# Patient Record
Sex: Male | Born: 1950 | Race: White | Hispanic: No | Marital: Single | Smoking: Current every day smoker
Health system: Southern US, Community
[De-identification: ages and names within clinical notes are randomized; demographics above are authoritative.]

## PROBLEM LIST (undated history)

## (undated) DIAGNOSIS — K219 Gastro-esophageal reflux disease without esophagitis: Secondary | ICD-10-CM

## (undated) DIAGNOSIS — J449 Chronic obstructive pulmonary disease, unspecified: Secondary | ICD-10-CM

## (undated) DIAGNOSIS — F250 Schizoaffective disorder, bipolar type: Secondary | ICD-10-CM

## (undated) DIAGNOSIS — F319 Bipolar disorder, unspecified: Secondary | ICD-10-CM

## (undated) DIAGNOSIS — E441 Mild protein-calorie malnutrition: Secondary | ICD-10-CM

## (undated) DIAGNOSIS — N400 Enlarged prostate without lower urinary tract symptoms: Secondary | ICD-10-CM

## (undated) DIAGNOSIS — R918 Other nonspecific abnormal finding of lung field: Secondary | ICD-10-CM

## (undated) DIAGNOSIS — Z720 Tobacco use: Secondary | ICD-10-CM

## (undated) DIAGNOSIS — F259 Schizoaffective disorder, unspecified: Secondary | ICD-10-CM

## (undated) HISTORY — DX: Chronic obstructive pulmonary disease, unspecified: J44.9

## (undated) HISTORY — DX: Gastro-esophageal reflux disease without esophagitis: K21.9

## (undated) HISTORY — DX: Tobacco use: Z72.0

## (undated) HISTORY — DX: Mild protein-calorie malnutrition: E44.1

## (undated) HISTORY — DX: Other nonspecific abnormal finding of lung field: R91.8

## (undated) HISTORY — PX: APPENDECTOMY: SHX54

## (undated) HISTORY — DX: Benign prostatic hyperplasia without lower urinary tract symptoms: N40.0

## (undated) HISTORY — PX: HERNIA REPAIR: SHX51

---

## 2005-01-22 ENCOUNTER — Inpatient Hospital Stay: Payer: Self-pay | Admitting: Surgery

## 2005-09-01 ENCOUNTER — Inpatient Hospital Stay: Payer: Self-pay | Admitting: Psychiatry

## 2005-10-04 ENCOUNTER — Emergency Department: Payer: Self-pay | Admitting: Emergency Medicine

## 2006-08-23 ENCOUNTER — Emergency Department: Payer: Self-pay | Admitting: Emergency Medicine

## 2007-01-08 ENCOUNTER — Emergency Department: Payer: Self-pay | Admitting: Internal Medicine

## 2009-03-27 ENCOUNTER — Ambulatory Visit: Payer: Self-pay | Admitting: Gastroenterology

## 2010-03-01 ENCOUNTER — Emergency Department: Payer: Self-pay | Admitting: Unknown Physician Specialty

## 2010-03-03 ENCOUNTER — Inpatient Hospital Stay: Payer: Self-pay | Admitting: General Surgery

## 2014-09-09 ENCOUNTER — Emergency Department: Payer: Self-pay | Admitting: Internal Medicine

## 2014-09-09 LAB — CBC
HCT: 44.2 % (ref 40.0–52.0)
HGB: 14.7 g/dL (ref 13.0–18.0)
MCH: 33.5 pg (ref 26.0–34.0)
MCHC: 33.3 g/dL (ref 32.0–36.0)
MCV: 101 fL — ABNORMAL HIGH (ref 80–100)
Platelet: 150 10*3/uL (ref 150–440)
RBC: 4.38 10*6/uL — AB (ref 4.40–5.90)
RDW: 13.4 % (ref 11.5–14.5)
WBC: 8.8 10*3/uL (ref 3.8–10.6)

## 2014-09-09 LAB — URINALYSIS, COMPLETE
BACTERIA: NONE SEEN
BILIRUBIN, UR: NEGATIVE
Blood: NEGATIVE
Glucose,UR: NEGATIVE mg/dL (ref 0–75)
Ketone: NEGATIVE
Leukocyte Esterase: NEGATIVE
Nitrite: NEGATIVE
PROTEIN: NEGATIVE
Ph: 6 (ref 4.5–8.0)
SQUAMOUS EPITHELIAL: NONE SEEN
Specific Gravity: 1.027 (ref 1.003–1.030)
WBC UR: 2 /HPF (ref 0–5)

## 2014-09-09 LAB — COMPREHENSIVE METABOLIC PANEL
ALBUMIN: 3.9 g/dL (ref 3.4–5.0)
ALK PHOS: 100 U/L
Anion Gap: 8 (ref 7–16)
BUN: 17 mg/dL (ref 7–18)
Bilirubin,Total: 0.4 mg/dL (ref 0.2–1.0)
CREATININE: 1.16 mg/dL (ref 0.60–1.30)
Calcium, Total: 8.6 mg/dL (ref 8.5–10.1)
Chloride: 108 mmol/L — ABNORMAL HIGH (ref 98–107)
Co2: 27 mmol/L (ref 21–32)
EGFR (African American): >60
Glucose: 113 mg/dL — ABNORMAL HIGH (ref 65–99)
Osmolality: 287 (ref 275–301)
POTASSIUM: 3.7 mmol/L (ref 3.5–5.1)
SGOT(AST): 38 U/L — ABNORMAL HIGH (ref 15–37)
SGPT (ALT): 38 U/L
Sodium: 143 mmol/L (ref 136–145)
TOTAL PROTEIN: 7.5 g/dL (ref 6.4–8.2)

## 2014-09-09 LAB — DRUG SCREEN, URINE
Amphetamines, Ur Screen: NEGATIVE (ref ?–1000)
BARBITURATES, UR SCREEN: NEGATIVE (ref ?–200)
BENZODIAZEPINE, UR SCRN: NEGATIVE (ref ?–200)
CANNABINOID 50 NG, UR ~~LOC~~: NEGATIVE (ref ?–50)
Cocaine Metabolite,Ur ~~LOC~~: NEGATIVE (ref ?–300)
MDMA (Ecstasy)Ur Screen: POSITIVE (ref ?–500)
Methadone, Ur Screen: NEGATIVE (ref ?–300)
OPIATE, UR SCREEN: NEGATIVE (ref ?–300)
Phencyclidine (PCP) Ur S: NEGATIVE (ref ?–25)
Tricyclic, Ur Screen: NEGATIVE (ref ?–1000)

## 2014-09-09 LAB — SALICYLATE LEVEL: SALICYLATES, SERUM: 2.7 mg/dL

## 2014-09-09 LAB — ETHANOL: Ethanol: <3 mg/dL

## 2014-09-09 LAB — ACETAMINOPHEN LEVEL
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
## 2015-02-21 ENCOUNTER — Emergency Department
Admission: EM | Admit: 2015-02-21 | Discharge: 2015-02-21 | Disposition: A | Discharge status: Home / Self Care | Payer: Medicare Other | Attending: Student | Admitting: Student

## 2015-02-21 ENCOUNTER — Encounter: Payer: Self-pay | Admitting: Emergency Medicine

## 2015-02-21 DIAGNOSIS — R251 Tremor, unspecified: Secondary | ICD-10-CM | POA: Diagnosis present

## 2015-02-21 DIAGNOSIS — R259 Unspecified abnormal involuntary movements: Secondary | ICD-10-CM

## 2015-02-21 DIAGNOSIS — Z720 Tobacco use: Secondary | ICD-10-CM | POA: Insufficient documentation

## 2015-02-21 DIAGNOSIS — Z7951 Long term (current) use of inhaled steroids: Secondary | ICD-10-CM | POA: Diagnosis not present

## 2015-02-21 DIAGNOSIS — F200 Paranoid schizophrenia: Secondary | ICD-10-CM

## 2015-02-21 DIAGNOSIS — R451 Restlessness and agitation: Secondary | ICD-10-CM | POA: Insufficient documentation

## 2015-02-21 DIAGNOSIS — Z79899 Other long term (current) drug therapy: Secondary | ICD-10-CM | POA: Diagnosis not present

## 2015-02-21 DIAGNOSIS — Z609 Problem related to social environment, unspecified: Secondary | ICD-10-CM

## 2015-02-21 DIAGNOSIS — G252 Other specified forms of tremor: Secondary | ICD-10-CM

## 2015-02-21 HISTORY — DX: Bipolar disorder, unspecified: F31.9

## 2015-02-21 HISTORY — DX: Schizoaffective disorder, unspecified: F25.9

## 2015-02-21 HISTORY — DX: Schizoaffective disorder, bipolar type: F25.0

## 2015-02-21 LAB — CBC WITH DIFFERENTIAL/PLATELET
Basophils Absolute: 0 10*3/uL (ref 0–0.1)
Basophils Relative: 0 %
EOS PCT: 1 %
Eosinophils Absolute: 0.1 10*3/uL (ref 0–0.7)
HEMATOCRIT: 43.5 % (ref 40.0–52.0)
HEMOGLOBIN: 14.5 g/dL (ref 13.0–18.0)
LYMPHS PCT: 20 %
Lymphs Abs: 1.4 10*3/uL (ref 1.0–3.6)
MCH: 32.6 pg (ref 26.0–34.0)
MCHC: 33.3 g/dL (ref 32.0–36.0)
MCV: 98 fL (ref 80.0–100.0)
Monocytes Absolute: 0.4 10*3/uL (ref 0.2–1.0)
Monocytes Relative: 6 %
NEUTROS ABS: 4.8 10*3/uL (ref 1.4–6.5)
Neutrophils Relative %: 73 %
PLATELETS: 167 10*3/uL (ref 150–440)
RBC: 4.44 MIL/uL (ref 4.40–5.90)
RDW: 14.5 % (ref 11.5–14.5)
WBC: 6.7 10*3/uL (ref 3.8–10.6)

## 2015-02-21 LAB — URINE DRUG SCREEN, QUALITATIVE (ARMC ONLY)
Amphetamines, Ur Screen: NOT DETECTED
BENZODIAZEPINE, UR SCRN: POSITIVE — AB
Barbiturates, Ur Screen: NOT DETECTED
CANNABINOID 50 NG, UR ~~LOC~~: NOT DETECTED
Cocaine Metabolite,Ur ~~LOC~~: NOT DETECTED
MDMA (Ecstasy)Ur Screen: NOT DETECTED
METHADONE SCREEN, URINE: NOT DETECTED
Opiate, Ur Screen: NOT DETECTED
PHENCYCLIDINE (PCP) UR S: NOT DETECTED
Tricyclic, Ur Screen: POSITIVE — AB

## 2015-02-21 LAB — ACETAMINOPHEN LEVEL: Acetaminophen (Tylenol), Serum: <10 ug/mL — ABNORMAL LOW (ref 10–30)

## 2015-02-21 LAB — SALICYLATE LEVEL: Salicylate Lvl: <4 mg/dL (ref 2.8–30.0)

## 2015-02-21 LAB — BASIC METABOLIC PANEL
Anion gap: 7 (ref 5–15)
BUN: 15 mg/dL (ref 6–20)
CO2: 28 mmol/L (ref 22–32)
Calcium: 8.9 mg/dL (ref 8.9–10.3)
Chloride: 105 mmol/L (ref 101–111)
Creatinine, Ser: 0.97 mg/dL (ref 0.61–1.24)
GFR calc non Af Amer: >60 mL/min (ref >60)
Glucose, Bld: 97 mg/dL (ref 65–99)
POTASSIUM: 3.7 mmol/L (ref 3.5–5.1)
Sodium: 140 mmol/L (ref 135–145)

## 2015-02-21 LAB — ETHANOL: Alcohol, Ethyl (B): <5 mg/dL (ref <5)

## 2015-02-21 MED ORDER — GABAPENTIN 400 MG PO CAPS: 400.0000 mg | ORAL_CAPSULE | Freq: Every morning | ORAL | Status: DC

## 2015-02-21 MED ORDER — OMEGA-3-ACID ETHYL ESTERS 1 G PO CAPS: 4.0000 | ORAL_CAPSULE | Freq: Every day | ORAL | Status: DC

## 2015-02-21 MED ORDER — NICOTINE 10 MG IN INHA
RESPIRATORY_TRACT | Status: AC
  Administered 2015-02-21: 1 via RESPIRATORY_TRACT
  Filled 2015-02-21: qty 36

## 2015-02-21 MED ORDER — GUAIFENESIN ER 600 MG PO TB12: 600.0000 mg | ORAL_TABLET | Freq: Two times a day (BID) | ORAL | Status: DC

## 2015-02-21 MED ORDER — DONEPEZIL HCL 5 MG PO TABS: 5.0000 mg | ORAL_TABLET | Freq: Every morning | ORAL | Status: DC

## 2015-02-21 MED ORDER — MOMETASONE FURO-FORMOTEROL FUM 100-5 MCG/ACT IN AERO: 2.0000 | INHALATION_SPRAY | Freq: Two times a day (BID) | RESPIRATORY_TRACT | Status: DC

## 2015-02-21 MED ORDER — TAMSULOSIN HCL 0.4 MG PO CAPS: 0.4000 mg | ORAL_CAPSULE | Freq: Every morning | ORAL | Status: DC

## 2015-02-21 MED ORDER — HALOPERIDOL 5 MG PO TABS: 5.0000 mg | ORAL_TABLET | Freq: Two times a day (BID) | ORAL | Status: DC

## 2015-02-21 MED ORDER — FLUTICASONE PROPIONATE HFA 44 MCG/ACT IN AERO: 1.0000 | INHALATION_SPRAY | Freq: Once | RESPIRATORY_TRACT | Status: DC

## 2015-02-21 MED ORDER — PANTOPRAZOLE SODIUM 40 MG PO TBEC: 40.0000 mg | DELAYED_RELEASE_TABLET | Freq: Every day | ORAL | Status: DC

## 2015-02-21 MED ORDER — NICOTINE 10 MG IN INHA
1.0000 | RESPIRATORY_TRACT | Status: DC | PRN
  Administered 2015-02-21: 1 via RESPIRATORY_TRACT

## 2015-02-21 MED ORDER — QUETIAPINE FUMARATE ER 400 MG PO TB24: 400.0000 mg | ORAL_TABLET | Freq: Every day | ORAL | Status: DC

## 2015-02-21 MED ORDER — SIMVASTATIN 20 MG PO TABS: 20.0000 mg | ORAL_TABLET | Freq: Every day | ORAL | Status: DC

## 2015-02-21 MED ORDER — CLONAZEPAM 0.5 MG PO TABS
0.5000 mg | ORAL_TABLET | Freq: Once | ORAL | Status: AC
  Administered 2015-02-21: 0.5 mg via ORAL

## 2015-02-21 MED ORDER — CLONAZEPAM 0.5 MG PO TABS
ORAL_TABLET | ORAL | Status: AC
  Administered 2015-02-21: 0.5 mg via ORAL
  Filled 2015-02-21: qty 1

## 2015-02-21 MED ORDER — LORAZEPAM 0.5 MG PO TABS: 0.5000 mg | ORAL_TABLET | Freq: Two times a day (BID) | ORAL | Status: DC

## 2015-02-21 MED ORDER — BUPROPION HCL 75 MG PO TABS: 75.0000 mg | ORAL_TABLET | Freq: Two times a day (BID) | ORAL | Status: DC

## 2015-02-21 MED ORDER — CHOLECALCIFEROL 10 MCG (400 UNIT) PO TABS: 50000.0000 [IU] | ORAL_TABLET | ORAL | Status: DC

## 2015-02-21 MED ORDER — CETIRIZINE HCL 5 MG/5ML PO SYRP: 10.0000 mg | ORAL_SOLUTION | Freq: Every morning | ORAL | Status: DC

## 2015-02-21 NOTE — Discharge Instructions (Signed)
You have been seen by psychiatry here in the emergency department. They believe that usually continue your current psychiatric medications. Follow up with her psychiatric care providers. Return to the emergency department if you have any urgent concerns.  Dystonias The dystonias are movement disorders in which sustained muscle contractions cause twisting and repetitive movements or abnormal postures. The movements, which are involuntary and sometimes painful, may affect a single muscle; a group of muscles such as those in the arms, legs, or neck; or the entire body. Early symptoms (problems) may include a deterioration in handwriting after writing several lines, foot cramps, and a tendency of one foot to pull up or drag after running or walking some distance. Other possible symptoms are tremor and voice or speech difficulties. Birth injury (particularly due to lack of oxygen), certain infections, reactions to certain drugs, heavy-metal or carbon monoxide poisoning, trauma (damage caused by an accident), or stroke can cause dystonic symptoms. About half the cases of dystonia have no connection to disease or injury and are called primary or idiopathic dystonia. Of the primary dystonias, many cases appear to be inherited in a dominant manner. Dystonias can also be symptoms of other diseases, some of which may be hereditary (passed down from parents). In some individuals, symptoms of a dystonia appear spontaneously in childhood between the ages of 68 and 50, usually in the foot or in the hand. For other individuals, the symptoms emerge in late adolescence or early adulthood. TREATMENT  No one treatment has been found universally effective for dystonia. Instead, physicians use a variety of therapies (medications, surgery and other treatments such as physical therapy, splinting, stress management, and biofeedback), aimed at reducing or eliminating muscle spasms and pain. Since response to drugs varies among patients  and even in the same person over time, the therapy must be individualized. PROGNOSIS The initial symptoms can be very mild and may be noticeable only after prolonged exertion, stress, or fatigue. Over a period of time, the symptoms may become more noticeable and widespread and be unrelenting; sometimes, however, there is little or no progression. RESEARCH BEING DONE Investigators believe that the dystonias result from an abnormality in an area of the brain called the basal ganglia, where some of the messages that initiate muscle contractions are processed. Scientists suspect a defect in the body's ability to process a group of chemicals called neurotransmitters that help cells in the brain communicate with each other. Scientists at the [place] laboratories have conducted detailed investigations of the pattern of muscle activity in persons with dystonias. Studies using EEG analysis and neuroimaging are probing brain activity. The search for the gene or genes responsible for some forms of dominantly inherited dystonias continues. 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 In 1997, the team sequenced the DYT1 gene and found that it codes for a previously unknown protein now called "torsin A." Document Released: 09/12/2002 Document Revised: 12/15/2011 Document Reviewed: 11/16/2013 [facility] Patient Information 2015 [place], [place]. This information is not intended to replace advice given to you by your health care provider. Make sure you discuss any questions you have with your health care provider.

## 2015-02-21 NOTE — ED Provider Notes (Signed)
[facility] Emergency Department Provider Note  ____________________________________________  Time seen: Approximately 11:55 AM  I have reviewed the triage vital signs and the nursing notes.   HISTORY  Chief Complaint Tremors    HPI Joseph Holt. is a 64 y.o. male with history of schizoaffective disorder, hyperlipidemia, chronic essential tremor presents for evaluation of tremor, agitation from group home. According to his caregivers, the patient has been agitated since his brother visited him 2 weeks ago. His psychiatrist has been tempting to titrate his meds to improve his agitation. His Risperdal was recently discontinued, his Ativan dose was cut in half however he continues to be mildly agitated. Tremors have also become more pronounced. Location is generalized. Timing is constant. Current severity is moderate. He denies any suicidal ideation or homicidal ideation. He reports that he sometimes "hallucinates like cows and stuff" but no command hallucinations. No recent illness including no cough, sneezing, runny nose, congestion, chest pain, difficulty breathing.   Past Medical History  Diagnosis Date  . Schizo affective schizophrenia   . Bipolar 1 disorder     There are no active problems to display for this patient.   Past Surgical History  Procedure Laterality Date  . Appendectomy    . Hernia repair      Current Outpatient Rx  Name  Route  Sig  Dispense  Refill  . buPROPion (WELLBUTRIN) 75 MG tablet   Oral   Take 75 mg by mouth 2 (two) times daily.         . cetirizine (ZYRTEC) 10 MG tablet   Oral   Take 10 mg by mouth daily.         Marland Kitchen donepezil (ARICEPT) 5 MG tablet   Oral   Take 5 mg by mouth every morning.         . fluticasone (VERAMYST) 27.5 MCG/SPRAY nasal spray   Nasal   Place 1 spray into the nose daily.         . Fluticasone-Salmeterol (ADVAIR) 250-50 MCG/DOSE AEPB   Inhalation   Inhale 1 puff into the lungs 2  (two) times daily.         Marland Kitchen gabapentin (NEURONTIN) 400 MG capsule   Oral   Take 400 mg by mouth every morning.         Marland Kitchen guaiFENesin (MUCINEX) 600 MG 12 hr tablet   Oral   Take 600 mg by mouth 2 (two) times daily.         . haloperidol (HALDOL) 5 MG tablet   Oral   Take 5 mg by mouth 2 (two) times daily.         Marland Kitchen LORazepam (ATIVAN) 0.5 MG tablet   Oral   Take 0.5 mg by mouth 2 (two) times daily.         Marland Kitchen omega-3 acid ethyl esters (LOVAZA) 1 G capsule   Oral   Take 4 capsules by mouth 2 (two) times daily.         Marland Kitchen omeprazole (PRILOSEC) 20 MG capsule   Oral   Take 20 mg by mouth every morning.         Marland Kitchen QUEtiapine (SEROQUEL) 400 MG tablet   Oral   Take 400 mg by mouth at bedtime.         . simvastatin (ZOCOR) 20 MG tablet   Oral   Take 20 mg by mouth at bedtime.         . tamsulosin (FLOMAX) 0.4 MG CAPS  capsule   Oral   Take 0.4 mg by mouth at bedtime.         . Vitamin D, Ergocalciferol, (DRISDOL) 50000 UNITS CAPS capsule   Oral   Take 50,000 Units by mouth every 7 (seven) days.           Allergies Review of patient's allergies indicates not on file.  History reviewed. No pertinent family history.  Social History History  Substance Use Topics  . Smoking status: Current Every Day Smoker  . Smokeless tobacco: Not on file  . Alcohol Use: No    Review of Systems Constitutional: No fever/chills Eyes: No visual changes. ENT: No sore throat. Cardiovascular: Denies chest pain. Respiratory: Denies shortness of breath. Gastrointestinal: No abdominal pain.  No nausea, no vomiting.  No diarrhea.  No constipation. Genitourinary: Negative for dysuria. Musculoskeletal: Negative for back pain. Skin: Negative for rash. Neurological: Negative for headaches, focal weakness or numbness.  10-point ROS otherwise negative.  ____________________________________________   PHYSICAL EXAM:  VITAL SIGNS: ED Triage Vitals  Enc Vitals Group      BP 02/21/15 1124 132/84 mmHg     Pulse Rate 02/21/15 1124 103     Resp --      Temp 02/21/15 1124 97.7 F (36.5 C)     Temp Source 02/21/15 1124 Oral     SpO2 --      Weight 02/21/15 1124 160 lb (72.576 kg)     Height 02/21/15 1124 6' (1.829 m)     Head Cir --      Peak Flow --      Pain Score --      Pain Loc --      Pain Edu? --      Excl. in [place]? --     Constitutional: Alert and oriented. Well appearing and in no acute distress. Noticeable resting tremor which improves with intention Eyes: Conjunctivae are normal. EOMI. Head: Atraumatic. Nose: No congestion/rhinnorhea. Mouth/Throat: Mucous membranes are moist.  Oropharynx non-erythematous. Neck: No stridor.   \Hematological/Lymphatic/Immunilogical: No cervical lymphadenopathy. Cardiovascular: Normal rate, regular rhythm. Grossly normal heart sounds.  Good peripheral circulation. Respiratory: Normal respiratory effort.  No retractions. Lungs CTAB. Gastrointestinal: Soft and nontender. No distention. No abdominal bruits. No CVA tenderness. Genitourinary: deferred Musculoskeletal: No lower extremity tenderness nor edema.  No joint effusions. Neurologic:  Normal speech and language. No gross focal neurologic deficits are appreciated. Speech is normal. No gait instability. Skin:  Skin is warm, dry and intact. No rash noted. Psychiatric: Mood and affect are normal. Speech and behavior are normal.  ____________________________________________   LABS (all labs ordered are listed, but only abnormal results are displayed)  Labs Reviewed  URINE DRUG SCREEN, QUALITATIVE ([place]) - Abnormal; Notable for the following:    Tricyclic, Ur Screen POSITIVE (*)    Benzodiazepine, Ur Scrn POSITIVE (*)    All other components within normal limits  CBC WITH DIFFERENTIAL/PLATELET  BASIC METABOLIC PANEL  ETHANOL  SALICYLATE LEVEL  URINE RAPID DRUG SCREEN (HOSP PERFORMED)  ACETAMINOPHEN LEVEL    ____________________________________________  EKG  none ____________________________________________  RADIOLOGY  none ____________________________________________   PROCEDURES  Procedure(s) performed: None  Critical Care performed: No  ____________________________________________   INITIAL IMPRESSION / ASSESSMENT AND PLAN / ED COURSE  Pertinent labs & imaging results that were available during my care of the patient were reviewed by me and considered in my medical decision making (see chart for details).  Joseph Holt. is a 64 y.o. male with history  of schizoaffective disorder, hyperlipidemia, chronic essential tremor presents for evaluation of tremor, agitation from group home. On exam, he is generally well-appearing and in no acute distress. Very faint tachycardia on arrival which has normalized at the time of my exam. No acute medical complaints. Plan for screening labs, psych and Behavioral Health consult.  ----------------------------------------- 3:03 PM on 02/21/2015 -----------------------------------------  APAP, salicylate and Etoh level pending labs otherwise unremarkable. Care transferred to Dr. Leontine Locket. ____________________________________________   FINAL CLINICAL IMPRESSION(S) / ED DIAGNOSES  Final diagnoses:  Agitation  Resting tremor      Joanne Gavel, MD 02/21/15 1504

## 2015-02-21 NOTE — [facility] Counselor (Deleted)
Updated BAC and Vitals faxed to [facility].
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
## 2015-02-21 NOTE — ED Notes (Signed)
Pt here with another pt being treated in flex. Pt out in lobby walking around for some length of time and comes up to desk and states that he needs to check in for "his hands shaking".

## 2015-02-21 NOTE — ED Notes (Signed)

## 2015-02-21 NOTE — ED Notes (Signed)
BEHAVIORAL HEALTH ROUNDING Patient sleeping: No. Patient alert and oriented: yes Behavior appropriate: Yes.  ; If no, describe:  Nutrition and fluids offered: Yes  Toileting and hygiene offered: Yes  Sitter present: yes Law enforcement present: Yes ODS

## 2015-02-21 NOTE — BH Assessment (Signed)
Assessment Note  Joseph Holt. is an 64 y.o. male Who presents to ER initially with his Joseph Holt with another resident who had fell and hit their head. According to the pt., he is seeing "big cows and crazy things."  According to [facility] (Joseph [phone]), while in the lobby the pt. spoke with nursing staff about having "shakes" due to changes in his medications. Pt. was told he was told he wasn't going to be admitted to the hospital and returned back into the lobby. Pt. came back to the nursing staff and reported he was having A/H. [facility] staff further reports the pt. has been having medication changes in the last 3 months. They have noticed changes in his behaviors. He has been easily agitated, annoyed and this started when about the time of the med changes.  His Out Patient Psychiatrist (Dr. Leatrice Jewels. Holt) with [facility]. He was last seen 02/16/2015. Most recent medication change is him being started on Haldol '5mg'$ .  Pt. denies SI/HI and the use of mind altering substances. He endorses AV/H.  Axis I: History of Schizophrenia Axis III:  Past Medical History  Diagnosis Date  . Schizo affective schizophrenia   . Bipolar 1 disorder    Axis IV: occupational problems, other psychosocial or environmental problems, problems related to social environment and problems with primary support group  Past Medical History:  Past Medical History  Diagnosis Date  . Schizo affective schizophrenia   . Bipolar 1 disorder     Past Surgical History  Procedure Laterality Date  . Appendectomy    . Hernia repair      Family History: History reviewed. No pertinent family history.  Social History:  reports that he has been smoking.  He does not have any smokeless tobacco history on file. He reports that he does not drink alcohol or use illicit drugs.  Additional Social History:  Alcohol / Drug Use Pain Medications: None Reported Prescriptions: None  Reported Over the Counter: None Reported History of alcohol / drug use?: No history of alcohol / drug abuse Longest period of sobriety (when/how long): None Reported Negative Consequences of Use:  (None Reported) Withdrawal Symptoms:  (None Reported)  CIWA: CIWA-Ar BP: 132/84 mmHg Pulse Rate: (!) 103 COWS:    Allergies: Allergies not on file  Holt Medications:  (Not in a hospital admission)  OB/GYN Status:  No LMP for male patient.  General Assessment Data Location of Assessment: [facility] ED TTS Assessment: In system Is this a Tele or Face-to-Face Assessment?: Face-to-Face Is this an Initial Assessment or a Re-assessment for this encounter?: Initial Assessment Marital status: Single Maiden name: n/a Is patient pregnant?: No Pregnancy Status: No Living Arrangements: [facility] ([facility] [phone]) Can pt return to current living arrangement?: Yes Admission Status: Voluntary Is patient capable of signing voluntary admission?: Yes Referral Source: Self/Family/Friend Insurance type: [place] Screening Exam ([place]) Medical Exam completed: Yes  Crisis Care Plan Living Arrangements: [facility] (Joseph [phone]) Name of Psychiatrist: n/a Name of Therapist: n/a  Education Status Is patient currently in school?: No Current Grade: n/a Highest grade of school patient has completed: Unknown Name of school: n/a Contact person: n/a  Risk to self with the past 6 months Suicidal Ideation: No Has patient been a risk to self within the past 6 months prior to admission? : No Suicidal Intent: No Has patient had any suicidal intent within the past 6 months prior to admission? : No  Is patient at risk for suicide?: No Suicidal Plan?: No Has patient had any suicidal plan within the past 6 months prior to admission? : No Access to Means: No What has been your use of drugs/alcohol within the last 12 months?: None Reported Previous  Attempts/Gestures: No Other Self Harm Risks: None Reported Triggers for Past Attempts: None known Intentional Self Injurious Behavior: None Family Suicide History: No Recent stressful life event(s): Other (Comment) (None Reported) Persecutory voices/beliefs?: No Depression: No Depression Symptoms: Feeling angry/irritable Substance abuse history and/or treatment for substance abuse?: No Suicide prevention information given to non-admitted patients: Not applicable  Risk to Others within the past 6 months Homicidal Ideation: No Does patient have any lifetime risk of violence toward others beyond the six months prior to admission? : No Thoughts of Harm to Others: No Current Homicidal Intent: No Current Homicidal Plan: No Access to Homicidal Means: No Identified Victim: None Reported History of harm to others?: No Assessment of Violence: None Noted Violent Behavior Description: None Reported Does patient have access to weapons?: No Criminal Charges Pending?: No Does patient have a court date: No Is patient on probation?: No  Psychosis Hallucinations: Auditory, Visual Delusions: None noted  Mental Status Report Appearance/Hygiene: Unremarkable Eye Contact: Fair Motor Activity: Freedom of movement, Unremarkable Speech: Logical/coherent Level of Consciousness: Alert Mood: Anxious, Despair, Irritable Affect: Anxious, Irritable Anxiety Level: Moderate Thought Processes: Coherent, Relevant Judgement: Unimpaired Orientation: Person, Place, Time, Situation, Appropriate for developmental age Obsessive Compulsive Thoughts/Behaviors: Minimal  Cognitive Functioning Concentration: Normal Memory: Recent Intact, Remote Intact IQ: Average Insight: Poor Impulse Control: Poor Appetite: Good Weight Loss: 0 Weight Gain: 0 Sleep: No Change Total Hours of Sleep: 4 (Per pt. report) Vegetative Symptoms: None  ADLScreening [facility] Assessment Services) Patient's cognitive ability adequate  to safely complete daily activities?: Yes Patient able to express need for assistance with ADLs?: Yes Independently performs ADLs?: Yes (appropriate for developmental age)  Prior Inpatient Therapy Prior Inpatient Therapy: No Prior Therapy Dates: None Reported Prior Therapy Facilty/Provider(s): None Reported Reason for Treatment: None Reported  Prior Outpatient Therapy Prior Outpatient Therapy: Yes Prior Therapy Dates: 2016 Prior Therapy Facilty/Provider(s): [facility] (Dr. Leatrice Jewels. Holt) Reason for Treatment: Schizophrenia Does patient have an ACCT team?: No Does patient have Intensive In-House Services?  : No Does patient have [facility] services? : No Does patient have P4CC services?: No  ADL Screening (condition at time of admission) Patient's cognitive ability adequate to safely complete daily activities?: Yes Patient able to express need for assistance with ADLs?: Yes Independently performs ADLs?: Yes (appropriate for developmental age)       Abuse/Neglect Assessment (Assessment to be complete while patient is alone) Physical Abuse: Denies Verbal Abuse: Denies Sexual Abuse: Denies Exploitation of patient/patient's resources: Denies Self-Neglect: Denies Values / Beliefs Cultural Requests During Hospitalization: None Spiritual Requests During Hospitalization: None Consults Spiritual Care Consult Needed: No Social Work Consult Needed: No Regulatory affairs officer (For Healthcare) Does patient have an advance directive?: No Would patient like information on creating an advanced directive?: Yes Higher education careers adviser given    Additional Information 1:1 In Past 12 Months?: No CIRT Risk: No Elopement Risk: No Does patient have medical clearance?: Yes  Child/Adolescent Assessment Running Away Risk: Denies (Pt. is an adult)  Disposition:  Disposition Initial Assessment Completed for this Encounter: Yes Disposition of Patient: Other  dispositions Other disposition(s): Other (Comment) (Psych MD to see) Patient referred to: Other (Comment) (Psych MD to see)  On Site Evaluation by:   Reviewed with  Physician:    Gunnar Fusi, MS, LCAS, LPC, [place], CCSI 02/21/2015 1:44 PM

## 2015-02-21 NOTE — ED Notes (Signed)
BEHAVIORAL HEALTH ROUNDING Patient sleeping: No. Patient alert and oriented: yes Behavior appropriate: No. ; If no, describe: Pt presents with tremors to both hands, otherwise calm and cooperative with staff.  Nutrition and fluids offered: Yes  Toileting and hygiene offered: Yes  Sitter present: yes Law enforcement present: Yes ODS

## 2015-02-21 NOTE — ED Notes (Signed)
NAD noted at time of D/C. Pt denies questions or concerns. Pt ambulatory to the lobby at this time.  
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
## 2015-02-21 NOTE — ED Notes (Signed)
BEHAVIORAL HEALTH ROUNDING Patient sleeping: Yes.   Patient alert and oriented: yes Behavior appropriate: Yes.  ; If no, describe:  Nutrition and fluids offered: Yes  Toileting and hygiene offered: Yes  Sitter present: yes Law enforcement present: Yes ODS

## 2015-02-21 NOTE — ED Notes (Signed)
Patient assigned to appropriate care area. Patient oriented to unit/care area: Informed that, for their safety, care areas are designed for safety and monitored by security cameras at all times; and visiting hours explained to patient. Patient verbalizes understanding, and verbal contract for safety obtained. 

## 2015-02-21 NOTE — ED Provider Notes (Signed)
The patient was seen by Dr. Weber Cooks of psychiatry. The patient is stable for discharge with no changes to his psychiatric medications. Dr. Weber Cooks has asked the patient follow-up with his regular psychiatric providers.  Ahmed Prima, MD 02/21/15 [phone]
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
## 2015-02-23 ENCOUNTER — Emergency Department
Admission: EM | Admit: 2015-02-23 | Discharge: 2015-02-23 | Disposition: A | Discharge status: Home / Self Care | Payer: Medicare Other | Attending: Emergency Medicine | Admitting: Emergency Medicine

## 2015-02-23 ENCOUNTER — Encounter: Payer: Self-pay | Admitting: Emergency Medicine

## 2015-02-23 DIAGNOSIS — G250 Essential tremor: Secondary | ICD-10-CM | POA: Insufficient documentation

## 2015-02-23 DIAGNOSIS — Z7952 Long term (current) use of systemic steroids: Secondary | ICD-10-CM | POA: Insufficient documentation

## 2015-02-23 DIAGNOSIS — F209 Schizophrenia, unspecified: Secondary | ICD-10-CM | POA: Insufficient documentation

## 2015-02-23 DIAGNOSIS — Z7951 Long term (current) use of inhaled steroids: Secondary | ICD-10-CM | POA: Diagnosis not present

## 2015-02-23 DIAGNOSIS — Z79899 Other long term (current) drug therapy: Secondary | ICD-10-CM | POA: Insufficient documentation

## 2015-02-23 DIAGNOSIS — Z720 Tobacco use: Secondary | ICD-10-CM | POA: Insufficient documentation

## 2015-02-23 DIAGNOSIS — R531 Weakness: Secondary | ICD-10-CM | POA: Diagnosis present

## 2015-02-23 LAB — URINALYSIS COMPLETE WITH MICROSCOPIC (ARMC ONLY)
BACTERIA UA: NONE SEEN
Bilirubin Urine: NEGATIVE
Glucose, UA: NEGATIVE mg/dL
Ketones, ur: NEGATIVE mg/dL
Leukocytes, UA: NEGATIVE
Nitrite: NEGATIVE
PROTEIN: NEGATIVE mg/dL
SPECIFIC GRAVITY, URINE: 1.006 (ref 1.005–1.030)
SQUAMOUS EPITHELIAL / LPF: NONE SEEN
pH: 6 (ref 5.0–8.0)

## 2015-02-23 LAB — BASIC METABOLIC PANEL
ANION GAP: 9 (ref 5–15)
BUN: 13 mg/dL (ref 6–20)
CHLORIDE: 103 mmol/L (ref 101–111)
CO2: 27 mmol/L (ref 22–32)
Calcium: 9.3 mg/dL (ref 8.9–10.3)
Creatinine, Ser: 0.93 mg/dL (ref 0.61–1.24)
Glucose, Bld: 179 mg/dL — ABNORMAL HIGH (ref 65–99)
Potassium: 3.6 mmol/L (ref 3.5–5.1)
Sodium: 139 mmol/L (ref 135–145)

## 2015-02-23 LAB — CBC
HEMATOCRIT: 45.9 % (ref 40.0–52.0)
HEMOGLOBIN: 15.3 g/dL (ref 13.0–18.0)
MCH: 32.6 pg (ref 26.0–34.0)
MCHC: 33.3 g/dL (ref 32.0–36.0)
MCV: 97.9 fL (ref 80.0–100.0)
Platelets: 191 10*3/uL (ref 150–440)
RBC: 4.69 MIL/uL (ref 4.40–5.90)
RDW: 14.1 % (ref 11.5–14.5)
WBC: 7.7 10*3/uL (ref 3.8–10.6)

## 2015-02-23 MED ORDER — LORAZEPAM 2 MG/ML IJ SOLN
0.5000 mg | Freq: Once | INTRAMUSCULAR | Status: AC
  Administered 2015-02-23: 0.5 mg via INTRAVENOUS

## 2015-02-23 MED ORDER — LORAZEPAM 2 MG/ML IJ SOLN
INTRAMUSCULAR | Status: AC
  Administered 2015-02-23: 0.5 mg via INTRAVENOUS
  Filled 2015-02-23: qty 1

## 2015-02-23 NOTE — Discharge Instructions (Signed)
You were evaluated for tremor and generalized weakness, and your exam and evaluation are reassuring. There is no evidence of a specific weakness or numbness and I do not suspect a stroke. Return to the emergency department for any fever, chest or abdominal pain, trouble breathing, altered mental status, worsening condition, weakness, or numbness.  Follow-up as scheduled with your psychiatrist. Follow-up in one week with your primary care physician.
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
## 2015-02-23 NOTE — ED Notes (Addendum)
I attempted to contact Mr. Laurance Flatten (caregiver)  to provide a ride for Mr. Caspers. #'s called were [phone], [phone]. Both mailboxes were full. Will check #'s and recall.

## 2015-02-23 NOTE — ED Provider Notes (Signed)
[facility] Emergency Department Provider Note   ____________________________________________  Time seen: On arrival I have reviewed the triage vital signs and the triage nursing note.  HISTORY  Chief Complaint Weakness   Historian Patient is a poor historian due to underlying psychiatric illness Some history is provided by caregiver from the group home who is here with the patient  HPI Joseph Holt. is a 64 y.o. male who asked to come into the emergency department this morning due to "not feeling good". He denies headache, fever, chest pain, shortness of breath, abdominal pain, and is only complaining of "I am paralyzed. "However, he does state that he can still walk and move all of his extremities. His caregiver reports that he ate only half of a waffle for breakfast. He does have a baseline tremor which seemed possibly increased today per the caregiver. The patient has recently had issues with agitation and had a change in medication from Seroquel to Haldol twice daily on May 13. He was also recently seen in the emergency department for tremor and agitation on 5/18 which is 2 days ago.He was released after seeing Dr. Weber Cooks with psychiatry. Symptoms are mild    Past Medical History  Diagnosis Date  . Schizo affective schizophrenia   . Bipolar 1 disorder     Patient Active Problem List   Diagnosis Date Noted  . Chronic schizophrenia 02/21/2015  . Social maladjustment 02/21/2015    Past Surgical History  Procedure Laterality Date  . Appendectomy    . Hernia repair      Current Outpatient Rx  Name  Route  Sig  Dispense  Refill  . buPROPion (WELLBUTRIN) 75 MG tablet   Oral   Take 75 mg by mouth 2 (two) times daily.         . cetirizine (ZYRTEC) 10 MG tablet   Oral   Take 10 mg by mouth daily.         Marland Kitchen donepezil (ARICEPT) 5 MG tablet   Oral   Take 5 mg by mouth every morning.         . fluticasone (VERAMYST) 27.5 MCG/SPRAY nasal  spray   Nasal   Place 1 spray into the nose daily.         . Fluticasone-Salmeterol (ADVAIR) 250-50 MCG/DOSE AEPB   Inhalation   Inhale 1 puff into the lungs 2 (two) times daily.         Marland Kitchen gabapentin (NEURONTIN) 400 MG capsule   Oral   Take 400 mg by mouth every morning.         Marland Kitchen guaiFENesin (MUCINEX) 600 MG 12 hr tablet   Oral   Take 600 mg by mouth 2 (two) times daily.         . haloperidol (HALDOL) 5 MG tablet   Oral   Take 5 mg by mouth 2 (two) times daily.         Marland Kitchen LORazepam (ATIVAN) 0.5 MG tablet   Oral   Take 0.5 mg by mouth 2 (two) times daily.         Marland Kitchen omega-3 acid ethyl esters (LOVAZA) 1 G capsule   Oral   Take 4 capsules by mouth 2 (two) times daily.         Marland Kitchen omeprazole (PRILOSEC) 20 MG capsule   Oral   Take 20 mg by mouth every morning.         Marland Kitchen QUEtiapine (SEROQUEL) 400 MG tablet   Oral  Take 400 mg by mouth at bedtime.         . simvastatin (ZOCOR) 20 MG tablet   Oral   Take 20 mg by mouth at bedtime.         . tamsulosin (FLOMAX) 0.4 MG CAPS capsule   Oral   Take 0.4 mg by mouth at bedtime.         . Vitamin D, Ergocalciferol, (DRISDOL) 50000 UNITS CAPS capsule   Oral   Take 50,000 Units by mouth every 7 (seven) days.           Allergies Review of patient's allergies indicates no known allergies.  No family history on file.  Social History History  Substance Use Topics  . Smoking status: Current Every Day Smoker  . Smokeless tobacco: Not on file  . Alcohol Use: No    Review of Systems  Constitutional: Negative for fever. Eyes: Negative for visual changes. ENT: Negative for sore throat. Cardiovascular: Negative for chest pain. Respiratory: Negative for shortness of breath. Gastrointestinal: Negative for abdominal pain, vomiting and diarrhea. Positive for occasional constipation Genitourinary: Negative for dysuria. Musculoskeletal: Negative for back pain. Skin: Negative for rash. Neurological:  Negative for headaches, focal weakness or numbness.  ____________________________________________   PHYSICAL EXAM:  VITAL SIGNS: ED Triage Vitals  Enc Vitals Group     BP 02/23/15 0900 106/80 mmHg     Pulse Rate 02/23/15 0900 79     Resp 02/23/15 0900 16     Temp --      Temp src --      SpO2 02/23/15 0900 94 %     Weight --      Height --      Head Cir --      Peak Flow --      Pain Score --      Pain Loc --      Pain Edu? --      Excl. in [place]? --      Constitutional: Alert and cooperative. Well appearing and in no distress. Baseline resting essential tremor. Eyes: Conjunctivae are normal. PERRL. Normal extraocular movements. ENT   Head: Normocephalic and atraumatic.   Nose: No congestion/rhinnorhea.   Mouth/Throat: Mucous membranes are moist.   Neck: No stridor. Cardiovascular: Normal rate, regular rhythm.  No murmurs, rubs, or gallops. Respiratory: Normal respiratory effort without tachypnea nor retractions. Breath sounds are clear and equal bilaterally. No wheezes/rales/rhonchi. Gastrointestinal: Soft and nontender. No distention.  Genitourinary: Musculoskeletal: Nontender with normal range of motion in all extremities. No joint effusions.  No lower extremity tenderness nor edema. Neurologic:  Normal speech and language. No gross focal neurologic deficits are appreciated. Skin:  Skin is warm, dry and intact. No rash noted. Psychiatric: Mood and affect are normal. Speech and behavior are normal. Patient exhibits appropriate insight and judgment.  ____________________________________________   EKG   ____________________________________________  LABS (pertinent positives/negatives)  CBC within normal limits and metabolic panel within normal limits other than a glucose of 179  ____________________________________________  RADIOLOGY Radiologist results reviewed   __________________________________________  PROCEDURES  Procedure(s) performed:  No Critical Care performed: No  ____________________________________________   ED COURSE / ASSESSMENT AND PLAN  Pertinent labs & imaging results that were available during my care of the patient were reviewed by me and considered in my medical decision making (see chart for details).  Patient is overall well-appearing with stable vital signs, physical exam other than a resting tremor which appears to be at his baseline when  compared to previous physician notes. I think his perseveration on the tremor is likely due to his underlying schizophrenia rather than a new acute medical illness. During the time of emergency department patient started to feel better and was interested in going home. I'll disease having any new acute medical illness. It took quite some time to get in touch with his group home to get him transportation home.  _ __________________________________________   FINAL CLINICAL IMPRESSION(S) / ED DIAGNOSES   Final diagnoses:  Essential tremor  Chronic schizophrenia      Lisa Roca, MD 02/28/15 [phone]

## 2015-02-23 NOTE — ED Notes (Signed)
Call Mr. Laurance Flatten for pt pickup. [phone]. Sounded like someone answered and then hung up. Re-dialed and call went to voice mail. Mail box full

## 2015-02-23 NOTE — ED Notes (Signed)
After numerous calls , Joseph Holt got Joseph Holt on phone. Joseph Holt on way.

## 2015-02-23 NOTE — ED Notes (Signed)
Pt to ED accompanied by caregiver at [facility] care home, pt c/o "not feeling right" and weakness, states he does not know if he is in pain, pt has tremor present but family care home caregiver states this is normal for patient

## 2015-03-12 ENCOUNTER — Emergency Department
Admission: EM | Admit: 2015-03-12 | Discharge: 2015-03-13 | Disposition: A | Discharge status: Home / Self Care | Payer: Medicare Other | Attending: Emergency Medicine | Admitting: Emergency Medicine

## 2015-03-12 ENCOUNTER — Encounter: Payer: Self-pay | Admitting: Emergency Medicine

## 2015-03-12 DIAGNOSIS — F319 Bipolar disorder, unspecified: Secondary | ICD-10-CM | POA: Insufficient documentation

## 2015-03-12 DIAGNOSIS — Z609 Problem related to social environment, unspecified: Secondary | ICD-10-CM | POA: Insufficient documentation

## 2015-03-12 DIAGNOSIS — F911 Conduct disorder, childhood-onset type: Secondary | ICD-10-CM | POA: Diagnosis present

## 2015-03-12 DIAGNOSIS — Z720 Tobacco use: Secondary | ICD-10-CM | POA: Insufficient documentation

## 2015-03-12 DIAGNOSIS — F209 Schizophrenia, unspecified: Secondary | ICD-10-CM | POA: Diagnosis not present

## 2015-03-12 DIAGNOSIS — F1310 Sedative, hypnotic or anxiolytic abuse, uncomplicated: Secondary | ICD-10-CM | POA: Diagnosis not present

## 2015-03-12 DIAGNOSIS — F200 Paranoid schizophrenia: Secondary | ICD-10-CM | POA: Diagnosis present

## 2015-03-12 DIAGNOSIS — Z79899 Other long term (current) drug therapy: Secondary | ICD-10-CM | POA: Diagnosis not present

## 2015-03-12 DIAGNOSIS — Z7951 Long term (current) use of inhaled steroids: Secondary | ICD-10-CM | POA: Insufficient documentation

## 2015-03-12 LAB — ETHANOL: ALCOHOL ETHYL (B): 8 mg/dL — AB (ref <5)

## 2015-03-12 LAB — CBC
HCT: 43.9 % (ref 40.0–52.0)
Hemoglobin: 14.3 g/dL (ref 13.0–18.0)
MCH: 32.2 pg (ref 26.0–34.0)
MCHC: 32.6 g/dL (ref 32.0–36.0)
MCV: 98.9 fL (ref 80.0–100.0)
PLATELETS: 165 10*3/uL (ref 150–440)
RBC: 4.44 MIL/uL (ref 4.40–5.90)
RDW: 13.9 % (ref 11.5–14.5)
WBC: 6.2 10*3/uL (ref 3.8–10.6)

## 2015-03-12 LAB — URINE DRUG SCREEN, QUALITATIVE (ARMC ONLY)
Amphetamines, Ur Screen: NOT DETECTED
BENZODIAZEPINE, UR SCRN: POSITIVE — AB
Barbiturates, Ur Screen: NOT DETECTED
COCAINE METABOLITE, UR ~~LOC~~: NOT DETECTED
Cannabinoid 50 Ng, Ur ~~LOC~~: NOT DETECTED
MDMA (Ecstasy)Ur Screen: NOT DETECTED
Methadone Scn, Ur: NOT DETECTED
OPIATE, UR SCREEN: NOT DETECTED
Phencyclidine (PCP) Ur S: NOT DETECTED
Tricyclic, Ur Screen: POSITIVE — AB

## 2015-03-12 LAB — COMPREHENSIVE METABOLIC PANEL
ALK PHOS: 88 U/L (ref 38–126)
ALT: 30 U/L (ref 17–63)
ANION GAP: 7 (ref 5–15)
AST: 31 U/L (ref 15–41)
Albumin: 4.4 g/dL (ref 3.5–5.0)
BILIRUBIN TOTAL: 0.6 mg/dL (ref 0.3–1.2)
BUN: 15 mg/dL (ref 6–20)
CO2: 24 mmol/L (ref 22–32)
Calcium: 9 mg/dL (ref 8.9–10.3)
Chloride: 108 mmol/L (ref 101–111)
Creatinine, Ser: 1.12 mg/dL (ref 0.61–1.24)
Glucose, Bld: 106 mg/dL — ABNORMAL HIGH (ref 65–99)
POTASSIUM: 3.6 mmol/L (ref 3.5–5.1)
SODIUM: 139 mmol/L (ref 135–145)
Total Protein: 7.4 g/dL (ref 6.5–8.1)

## 2015-03-12 LAB — SALICYLATE LEVEL: Salicylate Lvl: <4 mg/dL (ref 2.8–30.0)

## 2015-03-12 LAB — URINALYSIS COMPLETE WITH MICROSCOPIC (ARMC ONLY)
BACTERIA UA: NONE SEEN
Bilirubin Urine: NEGATIVE
GLUCOSE, UA: NEGATIVE mg/dL
Hgb urine dipstick: NEGATIVE
KETONES UR: NEGATIVE mg/dL
Leukocytes, UA: NEGATIVE
NITRITE: NEGATIVE
PH: 5 (ref 5.0–8.0)
PROTEIN: NEGATIVE mg/dL
SQUAMOUS EPITHELIAL / LPF: NONE SEEN
Specific Gravity, Urine: 1.023 (ref 1.005–1.030)

## 2015-03-12 LAB — ACETAMINOPHEN LEVEL: Acetaminophen (Tylenol), Serum: <10 ug/mL — ABNORMAL LOW (ref 10–30)

## 2015-03-12 MED ORDER — NICOTINE 10 MG IN INHA
RESPIRATORY_TRACT | Status: AC
  Administered 2015-03-12: 1 via RESPIRATORY_TRACT
  Filled 2015-03-12: qty 36

## 2015-03-12 MED ORDER — NICOTINE 10 MG IN INHA
1.0000 | RESPIRATORY_TRACT | Status: DC | PRN
  Administered 2015-03-12: 1 via RESPIRATORY_TRACT

## 2015-03-12 NOTE — ED Notes (Signed)
Spoke with Mr. Laurance Flatten, notified him of patient D/C. Mr Laurance Flatten asked if we knew [facility] had placed IVC, notified Mr. Laurance Flatten of IVC being rescinded.

## 2015-03-12 NOTE — ED Provider Notes (Signed)
[facility] Emergency Department Provider Note  ____________________________________________  Time seen: Approximately 3:44 PM  I have reviewed the triage vital signs and the nursing notes.   HISTORY  Chief Complaint Aggressive Behavior  History is limited by patient being a poor historian with chronic mental illness   HPI Joseph Holt. is a 64 y.o. male with a history of schizoaffective disorder and bipolar disorder who presents after "I got a little angry that they wouldn't take me to the hospital for this tremor."  He reports his medications were changed from her spread all to Haldol and he thinks he would like to be on lithium. He requests to see Dr. Weber Cooks. She states he feels calm now that he is at the hospital and not angry. He denies SI and HI. Patient states he has felt well recently otherwise and denies fever, headache, chest pain, back pain, abdominal pain.  Past Medical History  Diagnosis Date  . Schizo affective schizophrenia   . Bipolar 1 disorder     Patient Active Problem List   Diagnosis Date Noted  . Chronic schizophrenia 02/21/2015  . Social maladjustment 02/21/2015    Past Surgical History  Procedure Laterality Date  . Appendectomy    . Hernia repair      Current Outpatient Rx  Name  Route  Sig  Dispense  Refill  . buPROPion (WELLBUTRIN) 75 MG tablet   Oral   Take 75 mg by mouth 2 (two) times daily.         . cetirizine (ZYRTEC) 10 MG tablet   Oral   Take 10 mg by mouth daily.         Marland Kitchen donepezil (ARICEPT) 5 MG tablet   Oral   Take 5 mg by mouth every morning.         . fluticasone (VERAMYST) 27.5 MCG/SPRAY nasal spray   Nasal   Place 1 spray into the nose daily.         . Fluticasone-Salmeterol (ADVAIR) 250-50 MCG/DOSE AEPB   Inhalation   Inhale 1 puff into the lungs 2 (two) times daily.         Marland Kitchen gabapentin (NEURONTIN) 400 MG capsule   Oral   Take 400 mg by mouth every morning.         Marland Kitchen  guaiFENesin (MUCINEX) 600 MG 12 hr tablet   Oral   Take 600 mg by mouth 2 (two) times daily.         . haloperidol (HALDOL) 5 MG tablet   Oral   Take 5 mg by mouth 2 (two) times daily.         Marland Kitchen LORazepam (ATIVAN) 0.5 MG tablet   Oral   Take 0.5 mg by mouth 2 (two) times daily.         Marland Kitchen omega-3 acid ethyl esters (LOVAZA) 1 G capsule   Oral   Take 4 capsules by mouth 2 (two) times daily.         Marland Kitchen omeprazole (PRILOSEC) 20 MG capsule   Oral   Take 20 mg by mouth every morning.         Marland Kitchen QUEtiapine (SEROQUEL) 400 MG tablet   Oral   Take 400 mg by mouth at bedtime.         . simvastatin (ZOCOR) 20 MG tablet   Oral   Take 20 mg by mouth at bedtime.         . tamsulosin (FLOMAX) 0.4 MG CAPS capsule  Oral   Take 0.4 mg by mouth at bedtime.         . Vitamin D, Ergocalciferol, (DRISDOL) 50000 UNITS CAPS capsule   Oral   Take 50,000 Units by mouth every 7 (seven) days.           Allergies Review of patient's allergies indicates no known allergies.  No family history on file.  Social History History  Substance Use Topics  . Smoking status: Current Every Day Smoker  . Smokeless tobacco: Not on file  . Alcohol Use: No    Review of Systems Constitutional: No fever/chills Eyes: No visual changes. ENT: No sore throat. Cardiovascular: Denies chest pain. Respiratory: Denies shortness of breath. Gastrointestinal: No abdominal pain.  No nausea, no vomiting.  No diarrhea.   Genitourinary: Negative for dysuria. Musculoskeletal: Negative for back pain. Skin: Negative for rash. Neurological: Negative for headaches, focal weakness or numbness. Does endorse B arm tremor Psychiatric: Got angry earlier but now denies complaints 10-point ROS otherwise negative.  ____________________________________________   PHYSICAL EXAM:  VITAL SIGNS: ED Triage Vitals  Enc Vitals Group     BP 03/12/15 1404 102/62 mmHg     Pulse Rate 03/12/15 1404 102     Resp  03/12/15 1404 22     Temp 03/12/15 1404 97 F (36.1 C)     Temp Source 03/12/15 1404 Oral     SpO2 03/12/15 1404 95 %     Weight 03/12/15 1404 170 lb (77.111 kg)     Height 03/12/15 1404 '6\' 1"'$  (1.854 m)     Head Cir --      Peak Flow --      Pain Score --      Pain Loc --      Pain Edu? --      Excl. in [place]? --     Constitutional: Alert and oriented. Well appearing and in no acute distress in recliner in hall. Eyes: Conjunctivae are normal. PERRL.  Head: Atraumatic. Nose: No congestion/rhinnorhea. Mouth/Throat: Mucous membranes are moist.  Oropharynx non-erythematous. Neck: No stridor. Cardiovascular: Normal rate, regular rhythm. Grossly normal heart sounds.  Good peripheral circulation. Respiratory: Normal respiratory effort.  No retractions. Lungs CTAB. Gastrointestinal: Soft and nontender. No distention.  Musculoskeletal: No lower extremity tenderness nor edema.  No joint effusions. Neurologic:  Normal speech and language. No gross focal neurologic deficits are appreciated. Speech is normal.  Skin:  Skin is warm, dry and intact. No rash noted. Psychiatric: Calm & cooperative. Speech and behavior are normal.  ____________________________________________   LABS (all labs ordered are listed, but only abnormal results are displayed)  Labs Reviewed  ACETAMINOPHEN LEVEL - Abnormal; Notable for the following:    Acetaminophen (Tylenol), Serum <10 (*)    All other components within normal limits  COMPREHENSIVE METABOLIC PANEL - Abnormal; Notable for the following:    Glucose, Bld 106 (*)    All other components within normal limits  ETHANOL - Abnormal; Notable for the following:    Alcohol, Ethyl (B) 8 (*)    All other components within normal limits  URINALYSIS COMPLETEWITH MICROSCOPIC ([facility] ONLY) - Abnormal; Notable for the following:    Color, Urine YELLOW (*)    APPearance CLEAR (*)    All other components within normal limits  URINE DRUG SCREEN, QUALITATIVE ([facility] ONLY)  - Abnormal; Notable for the following:    Tricyclic, Ur Screen POSITIVE (*)    Benzodiazepine, Ur Scrn POSITIVE (*)    All other components within normal  limits  CBC  SALICYLATE LEVEL   ____________________________________________  EKG  n/a ____________________________________________  RADIOLOGY  n/a ____________________________________________   PROCEDURES  Procedure(s) performed: None  Critical Care performed: No  ____________________________________________   INITIAL IMPRESSION / ASSESSMENT AND PLAN / ED COURSE  Pertinent labs & imaging results that were available during my care of the patient were reviewed by me and considered in my medical decision making (see chart for details).  Pt is calm & cooperative.  Dr. Weber Cooks saw & examined pt at 5:30p & feels he can be safely discharged back to his group home.  Pt denies SI/HI.   ____________________________________________   FINAL CLINICAL IMPRESSION(S) / ED DIAGNOSES  Final diagnoses:  Chronic schizophrenia  Social maladjustment      Ponciano Ort, MD 03/12/15 [phone]

## 2015-03-12 NOTE — Discharge Instructions (Signed)
Discuss your medications with you psychiatric team so they can recommend a change that may help your symptoms. Call tomorrow & arrange an appointment as soon as possible.  Schizophrenia Schizophrenia is a mental illness. It may cause disturbed or disorganized thinking, speech, or behavior. People with schizophrenia have problems functioning in one or more areas of life: work, school, home, or relationships. People with schizophrenia are at increased risk for suicide, certain chronic physical illnesses, and unhealthy behaviors, such as smoking and drug use. People who have family members with schizophrenia are at higher risk of developing the illness. Schizophrenia affects men and women equally but usually appears at an earlier age (teenage or early adult years) in men.  SYMPTOMS The earliest symptoms are often subtle (prodrome) and may go unnoticed until the illness becomes more severe (first-break psychosis). Symptoms of schizophrenia may be continuous or may come and go in severity. Episodes often are triggered by major life events, such as family stress, college, [employer], marriage, pregnancy or child birth, divorce, or loss of a loved one. People with schizophrenia may see, hear, or feel things that do not exist (hallucinations). They may have false beliefs in spite of obvious proof to the contrary (delusions). Sometimes speech is incoherent or behavior is odd or withdrawn.  DIAGNOSIS Schizophrenia is diagnosed through an assessment by your caregiver. Your caregiver will ask questions about your thoughts, behavior, mood, and ability to function in daily life. Your caregiver may ask questions about your medical history and use of alcohol or drugs, including prescription medication. Your caregiver may also order blood tests and imaging exams. Certain medical conditions and substances can cause symptoms that resemble schizophrenia. Your caregiver may refer you to a mental health specialist for  evaluation. There are three major criterion for a diagnosis of schizophrenia:  Two or more of the following five symptoms are present for a month or longer:  Delusions. Often the delusions are that you are being attacked, harassed, cheated, persecuted or conspired against (persecutory delusions).  Hallucinations.   Disorganized speech that does not make sense to others.  Grossly disorganized (confused or unfocused) behavior or extremely overactive or underactive motor activity (catatonia).  Negative symptoms such as bland or blunted emotions (flat affect), loss of will power (avolition), and withdrawal from social contacts (social isolation).  Level of functioning in one or more major areas of life (work, school, relationships, or self-care) is markedly below the level of functioning before the onset of illness.   There are continuous signs of illness (either mild symptoms or decreased level of functioning) for at least 6 months or longer. TREATMENT  Schizophrenia is a long-term illness. It is best controlled with continuous treatment rather than treatment only when symptoms occur. The following treatments are used to manage schizophrenia:  Medication--Medication is the most effective and important form of treatment for schizophrenia. Antipsychotic medications are usually prescribed to help manage schizophrenia. Other types of medication may be added to relieve any symptoms that may occur despite the use of antipsychotic medications.  Counseling or talk therapy--Individual, group, or family counseling may be helpful in providing education, support, and guidance. Many people with schizophrenia also benefit from social skills and job skills (vocational) training. A combination of medication and counseling is best for managing the disorder over time. A procedure in which electricity is applied to the brain through the scalp (electroconvulsive therapy) may be used to treat catatonic  schizophrenia or schizophrenia in people who cannot take or do not respond to medication  and counseling. Document Released: 09/19/2000 Document Revised: 05/25/2013 Document Reviewed: 12/15/2012 [facility] Patient Information 2015 [place], [place]. This information is not intended to replace advice given to you by your health care provider. Make sure you discuss any questions you have with your health care provider.
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
## 2015-03-12 NOTE — ED Notes (Signed)

## 2015-03-12 NOTE — ED Notes (Signed)
ED BHU [place] Is the patient under IVC or is there intent for IVC: Yes.   Is the patient medically cleared: No. Is there vacancy in the ED BHU: No. Is the population mix appropriate for patient: No. Is the patient awaiting placement in inpatient or outpatient setting: No. Has the patient had a psychiatric consult: No. Survey of unit performed for contraband, proper placement and condition of furniture, tampering with fixtures in bathroom, shower, and each patient room: Yes.  ; Findings:  APPEARANCE/BEHAVIOR calm, cooperative and adequate rapport can be established NEURO ASSESSMENT Orientation: time, place and person Hallucinations: No.None noted (Hallucinations) Speech: Normal Gait: UTA, Pt has not ambulated at this time.  RESPIRATORY ASSESSMENT Normal expansion.  Clear to auscultation.  No rales, rhonchi, or wheezing. CARDIOVASCULAR ASSESSMENT regular rate and rhythm, S1, S2 normal, no murmur, click, rub or gallop GASTROINTESTINAL ASSESSMENT soft, nontender, BS WNL, no r/g EXTREMITIES normal strength, tone, and muscle mass, no deformities, no erythema, induration, or nodules, no evidence of joint effusion, no evidence of joint instability PLAN OF CARE Provide calm/safe environment. Vital signs assessed twice daily. ED BHU Assessment once each 12-hour shift. Collaborate with intake RN daily or as condition indicates. Assure the ED provider has rounded once each shift. Provide and encourage hygiene. Provide redirection as needed. Assess for escalating behavior; address immediately and inform ED provider.  Assess family dynamic and appropriateness for visitation as needed: Yes.  ; If necessary, describe findings:  Educate the patient/family about BHU procedures/visitation: No.; If necessary, describe findings: Pt has not had visitors at this time.

## 2015-03-12 NOTE — Consult Note (Signed)
[facility] Face-to-Face Psychiatry Consult   Reason for Consult:  Consult for this 64 year old man with a history of schizophrenia who came voluntarily to the emergency room requesting that he be given a new group home Referring Physician:  Mclaurin Patient Identification: Joseph Holt. MRN:  [id number] Principal Diagnosis: Chronic schizophrenia Diagnosis:   Patient Active Problem List   Diagnosis Date Noted  . Chronic schizophrenia [F20.9] 02/21/2015  . Social maladjustment [Z60.9] 02/21/2015    Total Time spent with patient: 1 hour  Subjective:   Joseph Holt. is a 64 y.o. male patient admitted with "I'm having a rough time with Nance Pear" patient is complaining about his group home. Says that they don't treat him well.Marland Kitchen  HPI:  Information from the patient and the chart. Patient came here voluntarily referred from [place]. He says that he wants to get a new group home because the people at his current group home or not treating him well. He says they make him sit outside on the lawn all day long and he is going to freeze to death. I pointed out to him that it is now summer and he admitted that was true but said he still didn't want to sit outside all day. He denies that anybody has been physically harmful to him. He denies that he has any suicidal thoughts and denies any homicidal thoughts. He denies that he is having any current hallucinations. He is cooperative with his medications and tolerates them well. He has a chronic essential tremor which bothers him but is stable. Not drinking and not using drugs. Mood is a little bit frustrated and down and not consistently depressed.  Past psychiatric history: Long history of schizophrenia. Multiple psychiatric hospitalizations including long-term stays in the past. Denies he's ever tried to kill himself. He is been on multiple anti-psychotics and apparently is now on clozapine. Has been stable out of the hospital for several years.  Medical  history: Patient has arthritis and a chronic essential tremor  Family history: Knows of no family history of mental illness  Substance abuse history: Denies that he drinks or abuses any drugs says he hasn't had a drug or alcohol problem in the past other than his smoking.  History: Long term resident of a local group home. He's been there for about 10 years. HPI Elements:   Quality:  Irritation and dissatisfaction with the group home. Severity:  Mild to moderate. Timing:  Worse for the last couple days. Duration:  Chronic recurrent problem. Context:  Chronic dissatisfaction.  Past Medical History:  Past Medical History  Diagnosis Date  . Schizo affective schizophrenia   . Bipolar 1 disorder     Past Surgical History  Procedure Laterality Date  . Appendectomy    . Hernia repair     Family History: No family history on file. Social History:  History  Alcohol Use No     History  Drug Use No    History   Social History  . Marital Status: Single    Spouse Name: N/A  . Number of Children: N/A  . Years of Education: N/A   Social History Main Topics  . Smoking status: Current Every Day Smoker  . Smokeless tobacco: Not on file  . Alcohol Use: No  . Drug Use: No  . Sexual Activity: Not on file   Other Topics Concern  . None   Social History Narrative   Additional Social History:  Allergies:  No Known Allergies  Labs:  Results for orders placed or performed during the hospital encounter of 03/12/15 (from the past 48 hour(s))  Acetaminophen level     Status: Abnormal   Collection Time: 03/12/15  2:14 PM  Result Value Ref Range   Acetaminophen (Tylenol), Serum <10 (L) 10 - 30 ug/mL    Comment:        THERAPEUTIC CONCENTRATIONS VARY SIGNIFICANTLY. A RANGE OF 10-30 ug/mL MAY BE AN EFFECTIVE CONCENTRATION FOR MANY PATIENTS. HOWEVER, SOME ARE BEST TREATED AT CONCENTRATIONS OUTSIDE THIS RANGE. ACETAMINOPHEN CONCENTRATIONS >150  ug/mL AT 4 HOURS AFTER INGESTION AND >50 ug/mL AT 12 HOURS AFTER INGESTION ARE OFTEN ASSOCIATED WITH TOXIC REACTIONS.   CBC     Status: None   Collection Time: 03/12/15  2:14 PM  Result Value Ref Range   WBC 6.2 3.8 - 10.6 K/uL   RBC 4.44 4.40 - 5.90 MIL/uL   Hemoglobin 14.3 13.0 - 18.0 g/dL   HCT 43.9 40.0 - 52.0 %   MCV 98.9 80.0 - 100.0 fL   MCH 32.2 26.0 - 34.0 pg   MCHC 32.6 32.0 - 36.0 g/dL   RDW 13.9 11.5 - 14.5 %   Platelets 165 150 - 440 K/uL  Comprehensive metabolic panel     Status: Abnormal   Collection Time: 03/12/15  2:14 PM  Result Value Ref Range   Sodium 139 135 - 145 mmol/L   Potassium 3.6 3.5 - 5.1 mmol/L   Chloride 108 101 - 111 mmol/L   CO2 24 22 - 32 mmol/L   Glucose, Bld 106 (H) 65 - 99 mg/dL   BUN 15 6 - 20 mg/dL   Creatinine, Ser 1.12 0.61 - 1.24 mg/dL   Calcium 9.0 8.9 - 10.3 mg/dL   Total Protein 7.4 6.5 - 8.1 g/dL   Albumin 4.4 3.5 - 5.0 g/dL   AST 31 15 - 41 U/L   ALT 30 17 - 63 U/L   Alkaline Phosphatase 88 38 - 126 U/L   Total Bilirubin 0.6 0.3 - 1.2 mg/dL   GFR calc non Af Amer >60 >60 mL/min   GFR calc Af Amer >60 >60 mL/min    Comment: (NOTE) The eGFR has been calculated using the CKD EPI equation. This calculation has not been validated in all clinical situations. eGFR's persistently <60 mL/min signify possible Chronic Kidney Disease.    Anion gap 7 5 - 15  Ethanol (ETOH)     Status: Abnormal   Collection Time: 03/12/15  2:14 PM  Result Value Ref Range   Alcohol, Ethyl (B) 8 (H) <5 mg/dL    Comment:        LOWEST DETECTABLE LIMIT FOR SERUM ALCOHOL IS 11 mg/dL FOR MEDICAL PURPOSES ONLY   Salicylate level     Status: None   Collection Time: 03/12/15  2:14 PM  Result Value Ref Range   Salicylate Lvl <1.9 2.8 - 30.0 mg/dL  Urinalysis complete, with microscopic ([facility] only)     Status: Abnormal   Collection Time: 03/12/15  2:18 PM  Result Value Ref Range   Color, Urine YELLOW (A) YELLOW   APPearance CLEAR (A) CLEAR   Glucose,  UA NEGATIVE NEGATIVE mg/dL   Bilirubin Urine NEGATIVE NEGATIVE   Ketones, ur NEGATIVE NEGATIVE mg/dL   Specific Gravity, Urine 1.023 1.005 - 1.030   Hgb urine dipstick NEGATIVE NEGATIVE   pH 5.0 5.0 - 8.0   Protein, ur NEGATIVE NEGATIVE mg/dL   Nitrite NEGATIVE NEGATIVE  Leukocytes, UA NEGATIVE NEGATIVE   RBC / HPF 0-5 0 - 5 RBC/hpf   WBC, UA 0-5 0 - 5 WBC/hpf   Bacteria, UA NONE SEEN NONE SEEN   Squamous Epithelial / LPF NONE SEEN NONE SEEN   Mucous PRESENT   Urine Drug Screen, Qualitative ([place] only)     Status: Abnormal   Collection Time: 03/12/15  2:18 PM  Result Value Ref Range   Tricyclic, Ur Screen POSITIVE (A) NONE DETECTED   Amphetamines, Ur Screen NONE DETECTED NONE DETECTED   MDMA (Ecstasy)Ur Screen NONE DETECTED NONE DETECTED   Cocaine Metabolite,Ur [place] NONE DETECTED NONE DETECTED   Opiate, Ur Screen NONE DETECTED NONE DETECTED   Phencyclidine (PCP) Ur S NONE DETECTED NONE DETECTED   Cannabinoid 50 Ng, Ur [place] NONE DETECTED NONE DETECTED   Barbiturates, Ur Screen NONE DETECTED NONE DETECTED   Benzodiazepine, Ur Scrn POSITIVE (A) NONE DETECTED   Methadone Scn, Ur NONE DETECTED NONE DETECTED    Comment: (NOTE) 595  Tricyclics, urine               Cutoff 1000 ng/mL 200  Amphetamines, urine             Cutoff 1000 ng/mL 300  MDMA (Ecstasy), urine           Cutoff 500 ng/mL 400  Cocaine Metabolite, urine       Cutoff 300 ng/mL 500  Opiate, urine                   Cutoff 300 ng/mL 600  Phencyclidine (PCP), urine      Cutoff 25 ng/mL 700  Cannabinoid, urine              Cutoff 50 ng/mL 800  Barbiturates, urine             Cutoff 200 ng/mL 900  Benzodiazepine, urine           Cutoff 200 ng/mL 1000 Methadone, urine                Cutoff 300 ng/mL 1100 1200 The urine drug screen provides only a preliminary, unconfirmed 1300 analytical test result and should not be used for non-medical 1400 purposes. Clinical consideration and professional judgment should 1500 be applied to  any positive drug screen result due to possible 1600 interfering substances. A more specific alternate chemical method 1700 must be used in order to obtain a confirmed analytical result.  1800 Gas chromato graphy / mass spectrometry (GC/MS) is the preferred 1900 confirmatory method.     Vitals: Blood pressure 102/62, pulse 102, temperature 97 F (36.1 C), temperature source Oral, resp. rate 22, height 6' 1" (1.854 m), weight 77.111 kg (170 lb), SpO2 95 %.  Risk to Self: Is patient at risk for suicide?: No Risk to Others:   Prior Inpatient Therapy:   Prior Outpatient Therapy:    Current Facility-Administered Medications  Medication Dose Route Frequency Provider Last Rate Last Dose  . nicotine (NICOTROL) 10 MG inhaler 1 continuous puffing  1 continuous puffing Inhalation PRN Gonzella Lex, MD       Current Outpatient Prescriptions  Medication Sig Dispense Refill  . buPROPion (WELLBUTRIN) 75 MG tablet Take 75 mg by mouth 2 (two) times daily.    . cetirizine (ZYRTEC) 10 MG tablet Take 10 mg by mouth daily.    Marland Kitchen donepezil (ARICEPT) 5 MG tablet Take 5 mg by mouth every morning.    . fluticasone (VERAMYST) 27.5 MCG/SPRAY nasal  spray Place 1 spray into the nose daily.    . Fluticasone-Salmeterol (ADVAIR) 250-50 MCG/DOSE AEPB Inhale 1 puff into the lungs 2 (two) times daily.    Marland Kitchen gabapentin (NEURONTIN) 400 MG capsule Take 400 mg by mouth every morning.    Marland Kitchen guaiFENesin (MUCINEX) 600 MG 12 hr tablet Take 600 mg by mouth 2 (two) times daily.    . haloperidol (HALDOL) 5 MG tablet Take 5 mg by mouth 2 (two) times daily.    Marland Kitchen LORazepam (ATIVAN) 0.5 MG tablet Take 0.5 mg by mouth 2 (two) times daily.    Marland Kitchen omega-3 acid ethyl esters (LOVAZA) 1 G capsule Take 4 capsules by mouth 2 (two) times daily.    Marland Kitchen omeprazole (PRILOSEC) 20 MG capsule Take 20 mg by mouth every morning.    Marland Kitchen QUEtiapine (SEROQUEL) 400 MG tablet Take 400 mg by mouth at bedtime.    . simvastatin (ZOCOR) 20 MG tablet Take 20 mg by  mouth at bedtime.    . tamsulosin (FLOMAX) 0.4 MG CAPS capsule Take 0.4 mg by mouth at bedtime.    . Vitamin D, Ergocalciferol, (DRISDOL) 50000 UNITS CAPS capsule Take 50,000 Units by mouth every 7 (seven) days.      Musculoskeletal: Strength & Muscle Tone: spastic Gait & Station: unsteady Patient leans: N/A  Psychiatric Specialty Exam: Physical Exam  Constitutional: He appears well-developed and well-nourished.  HENT:  Head: Normocephalic and atraumatic.  Eyes: Conjunctivae are normal. Pupils are equal, round, and reactive to light.  Neck: Normal range of motion.  Cardiovascular: Normal heart sounds.   Respiratory: Effort normal.  GI: Soft.  Musculoskeletal: Normal range of motion.  Neurological: He is alert.  Skin: Skin is warm and dry.  Psychiatric: He has a normal mood and affect. His speech is normal and behavior is normal. His mood appears not anxious. Thought content is paranoid. Cognition and memory are impaired. He expresses impulsivity.    Review of Systems  Constitutional: Negative.   HENT: Negative.   Eyes: Negative.   Respiratory: Negative.   Cardiovascular: Negative.   Gastrointestinal: Negative.   Musculoskeletal: Negative.   Skin: Negative.   Neurological: Negative.   Psychiatric/Behavioral: Negative for depression, suicidal ideas, hallucinations, memory loss and substance abuse. The patient is not nervous/anxious and does not have insomnia.     Blood pressure 102/62, pulse 102, temperature 97 F (36.1 C), temperature source Oral, resp. rate 22, height 6' 1" (1.854 m), weight 77.111 kg (170 lb), SpO2 95 %.Body mass index is 22.43 kg/(m^2).  General Appearance: Disheveled  Eye Contact::  Good  Speech:  Clear and Coherent  Volume:  Decreased  Mood:  Anxious  Affect:  Congruent  Thought Process:  Linear  Orientation:  Full (Time, Place, and Person)  Thought Content:  Rumination  Suicidal Thoughts:  No  Homicidal Thoughts:  No  Memory:  Immediate;    Good Recent;   Good Remote;   Good  Judgement:  Impaired  Insight:  Shallow  Psychomotor Activity:  Decreased  Concentration:  Fair  Recall:  [place] of Knowledge:Fair  Language: Good  Akathisia:  No  Handed:  Right  AIMS (if indicated):     Assets:  Communication Skills Desire for Improvement Housing Social Support  ADL's:  Intact  Cognition: WNL  Sleep:      Medical Decision Making: Review or order clinical lab tests (1), Discuss test with performing physician (1), Review and summation of old records (2), Established Problem, Worsening (2) and Review  of Medication Regimen & Side Effects (2)  Treatment Plan Summary: Plan This is a 64 year old man with schizophrenia. On evaluation today he essentially is asymptomatic from a psychiatric standpoint. He is not complaining of depression and is not complaining of anger and there is no sign of mania. His psychotic symptoms are quiet. He is quite pleasant and smiling in his interaction. I think he impulsively requested coming over here because of some frustration at his group home but there is no indication for hospitalization. Supportive counseling completed. No change to medicine. No prescriptions. Patient can be discharged back to his group home. Continue to follow up with [facility].  Plan:  No evidence of imminent risk to self or others at present.   Patient does not meet criteria for psychiatric inpatient admission. Supportive therapy provided about ongoing stressors. Disposition: Discharge at the discretion of the emergency room physician  Hawkins Seaman 03/12/2015 6:16 PM

## 2015-03-12 NOTE — BH Assessment (Signed)
Assessment Note  Joseph Magallon. is an 64 y.o. male. Joseph Holt reports to the ED with complaints of tremors.  He reports that he is not depressed, nor is he anxious.  He denied having auditory or visual hallucinations.  He denied having homicidal or suicidal ideation or intent.  He denied use of alcohol or drugs.  He reports that he is having tremors due to the fact that he only has been able to smoke 4 cigarettes in his current residence where before he could smoke half a pack to a pack a day.  He denied being in pain or having any other health concerns.  Axis I: Anxiety Disorder NOS Axis II: Deferred Axis III:  Past Medical History  Diagnosis Date   Schizo affective schizophrenia    Bipolar 1 disorder    Axis IV: housing problems Axis V: 61-70 mild symptoms  Past Medical History:  Past Medical History  Diagnosis Date   Schizo affective schizophrenia    Bipolar 1 disorder     Past Surgical History  Procedure Laterality Date   Appendectomy     Hernia repair      Family History: No family history on file.  Social History:  reports that he has been smoking.  He does not have any smokeless tobacco history on file. He reports that he does not drink alcohol or use illicit drugs.  Additional Social History:  Alcohol / Drug Use History of alcohol / drug use?: No history of alcohol / drug abuse  CIWA: CIWA-Ar BP: 102/62 mmHg Pulse Rate: (!) 102 COWS:    Allergies: No Known Allergies  Home Medications:  (Not in a hospital admission)  OB/GYN Status:  No LMP for male patient.  General Assessment Data Location of Assessment: [facility] ED TTS Assessment: In system Is this a Tele or Face-to-Face Assessment?: Face-to-Face Is this an Initial Assessment or a Re-assessment for this encounter?: Initial Assessment Marital status: Single Maiden name: n/a Is patient pregnant?: No Pregnancy Status: No Living Arrangements: Group Home Can pt return to current living arrangement?:  Yes Admission Status: Voluntary Is patient capable of signing voluntary admission?: Yes Referral Source: MD Insurance type: Medicaid  Medical Screening Exam ([place]) Medical Exam completed: Yes  Crisis Care Plan Living Arrangements: Group Home Name of Psychiatrist: None Reported Name of Therapist: None Reported  Education Status Is patient currently in school?: No Current Grade: n/a Highest grade of school patient has completed: 11th Name of school: Unknown Contact person: n/a  Risk to self with the past 6 months Suicidal Ideation: No Has patient been a risk to self within the past 6 months prior to admission? : No Suicidal Intent: No Has patient had any suicidal intent within the past 6 months prior to admission? : No Is patient at risk for suicide?: No Suicidal Plan?: No Has patient had any suicidal plan within the past 6 months prior to admission? : No Access to Means: No What has been your use of drugs/alcohol within the last 12 months?: None Reported Previous Attempts/Gestures: No How many times?: 0 Other Self Harm Risks: Denied Triggers for Past Attempts: None known Intentional Self Injurious Behavior: None Family Suicide History: No Recent stressful life event(s): Other (Comment) (Reports housing concerns) Persecutory voices/beliefs?: No Depression: No Depression Symptoms:  (None Reported) Substance abuse history and/or treatment for substance abuse?: No Suicide prevention information given to non-admitted patients: Not applicable  Risk to Others within the past 6 months Homicidal Ideation: No Does patient have  any lifetime risk of violence toward others beyond the six months prior to admission? : No Thoughts of Harm to Others: No Current Homicidal Intent: No Current Homicidal Plan: No Access to Homicidal Means: No Identified Victim: None reported History of harm to others?: No Assessment of Violence: On admission Violent Behavior Description:  None reported Does patient have access to weapons?: No Criminal Charges Pending?: No Does patient have a court date: No Is patient on probation?: No  Psychosis Hallucinations: None noted Delusions: None noted  Mental Status Report Appearance/Hygiene: In scrubs Eye Contact: Good Motor Activity: Tremors Speech: Unremarkable Level of Consciousness: Alert Mood: Pleasant Affect: Appropriate to circumstance Anxiety Level: None Thought Processes: Coherent Judgement: Unimpaired Orientation: Person, Place, Time, Situation Obsessive Compulsive Thoughts/Behaviors: None  Cognitive Functioning Appetite: Good                   Abuse/Neglect Assessment (Assessment to be complete while patient is alone) Physical Abuse: Denies Verbal Abuse: Denies Sexual Abuse: Denies Exploitation of patient/patient's resources: Denies Self-Neglect: Denies Values / Beliefs Cultural Requests During Hospitalization: None Spiritual Requests During Hospitalization: None   Advance Directives (For Healthcare) Does patient have an advance directive?: No Would patient like information on creating an advanced directive?: No - patient declined information          Disposition:  Disposition Initial Assessment Completed for this Encounter: Yes Disposition of Patient: Other dispositions (To be seen by the psychiatrist) Other disposition(s):  (To be seen by the psychiatrist) Patient referred to: Other (Comment) (Psychiatrist to see)  On Site Evaluation by:   Reviewed with Physician:    Guerry Minors 03/12/2015 10:09 PM

## 2015-03-12 NOTE — ED Notes (Signed)
BEHAVIORAL HEALTH ROUNDING Patient sleeping: No. Patient alert and oriented: yes Behavior appropriate: Yes.  ; If no, describe:  Nutrition and fluids offered: Yes  Toileting and hygiene offered: Yes  Sitter present: yes Law enforcement present: Yes ODS
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
## 2015-03-12 NOTE — ED Notes (Signed)
BEHAVIORAL HEALTH ROUNDING  Patient sleeping: No.  Patient alert and oriented: Yes  Behavior appropriate: Yes. ; If no, describe:  Nutrition and fluids offered: Yes  Toileting and hygiene offered: Yes  Sitter present: Yes  Law enforcement present: Yes ODS

## 2015-03-12 NOTE — ED Notes (Signed)
BEHAVIORAL HEALTH ROUNDING Patient sleeping: No. Patient alert and oriented: yes Behavior appropriate: Yes.   Nutrition and fluids offered: Yes  Toileting and hygiene offered: Yes  Sitter present: yes Law enforcement present: Yes   ENVIRONMENTAL ASSESSMENT Potentially harmful objects out of patient reach: Yes.   Personal belongings secured: Yes.   Patient dressed in hospital provided attire only: Yes.   Plastic bags out of patient reach: Yes.   Patient care equipment (cords, cables, call bells, lines, and drains) shortened, removed, or accounted for: Yes.   Equipment and supplies removed from bottom of stretcher: Yes.   Potentially toxic materials out of patient reach: Yes.   Sharps container removed or out of patient reach: Yes.

## 2015-03-13 NOTE — ED Notes (Signed)

## 2015-03-13 NOTE — ED Notes (Signed)
BEHAVIORAL HEALTH ROUNDING  Patient sleeping: No.  Patient alert and oriented: Yes  Behavior appropriate: Yes. ; If no, describe:  Nutrition and fluids offered: Yes  Toileting and hygiene offered: Yes  Sitter present: Yes  Law enforcement present: Yes ODS

## 2015-03-13 NOTE — ED Notes (Signed)
BEHAVIORAL HEALTH ROUNDING  Patient sleeping: Yes.  Patient alert and oriented: Sleeping   Behavior appropriate: Yes. ; If no, describe:  Nutrition and fluids offered: Sleeping  Toileting and hygiene offered: Sleeping  Sitter present: No Law enforcement present: Yes ODS
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
## 2015-03-13 NOTE — Progress Notes (Signed)
LCSW met with patient and provided a family care home list and the [place] [facility] [phone] number and  Viviana Simpler [phone]  In consultation with Otila Kluver ED RN, Joseph Holt will be picking up patient  at 11am .  LCSW called both the Ombudsman and DSS adult services and left detailed messages that Joseph Fadeley needs additional support in finding another [facility] or group home. Patients contact information was provided to each organization.  LCSW team lead supported patient and provided beverages and refreshments and encouraged patients

## 2015-03-13 NOTE — ED Notes (Signed)
BEHAVIORAL HEALTH ROUNDING Patient sleeping: No. Patient alert and oriented: yes Behavior appropriate: Yes.  ; If no, describe:  Nutrition and fluids offered: Yes  Toileting and hygiene offered: Yes  Sitter present: no Law enforcement present: No

## 2015-03-13 NOTE — ED Notes (Signed)
BEHAVIORAL HEALTH ROUNDING Patient sleeping: Yes. Patient alert and oriented: Sleeping  Behavior appropriate: Yes.  ; If no, describe:  Nutrition and fluids offered: Sleeping  Toileting and hygiene offered: Sleeping  Sitter present: No Law enforcement present: Yes

## 2015-03-13 NOTE — ED Notes (Signed)
BEHAVIORAL HEALTH ROUNDING Patient sleeping: No. Patient alert and oriented: yes Behavior appropriate: Yes.  ; If no, describe:  Nutrition and fluids offered: Yes  Toileting and hygiene offered: Yes  Sitter present: no Law enforcement present: no, pt not IVC

## 2015-03-13 NOTE — ED Notes (Signed)
Spoke with Baxter Flattery from social work regarding pt.  She states she will have someone come down to do consult.  Was unable to give time frame for consult at this time.

## 2015-03-13 NOTE — ED Notes (Signed)
Patient will be discharged back to Group home and will follow up with [facility].

## 2015-03-13 NOTE — ED Notes (Signed)
ED BHU [place] Is the patient under IVC or is there intent for IVC: No. Is the patient medically cleared: Yes.   Is there vacancy in the ED BHU: No. Is the population mix appropriate for patient: Yes.   Is the patient awaiting placement in inpatient or outpatient setting: No. Has the patient had a psychiatric consult: Yes.   Survey of unit performed for contraband, proper placement and condition of furniture, tampering with fixtures in bathroom, shower, and each patient room: Yes.  ; Findings:  APPEARANCE/BEHAVIOR calm, cooperative and adequate rapport can be established NEURO ASSESSMENT Orientation: time, place and person Hallucinations: No.None noted (Hallucinations) Speech: Normal Gait: normal RESPIRATORY ASSESSMENT Normal expansion.  Clear to auscultation.  No rales, rhonchi, or wheezing. CARDIOVASCULAR ASSESSMENT regular rate and rhythm, S1, S2 normal, no murmur, click, rub or gallop GASTROINTESTINAL ASSESSMENT soft, nontender, BS WNL, no r/g EXTREMITIES normal strength, tone, and muscle mass PLAN OF CARE Provide calm/safe environment. Vital signs assessed twice daily. ED BHU Assessment once each 12-hour shift. Collaborate with intake RN daily or as condition indicates. Assure the ED provider has rounded once each shift. Provide and encourage hygiene. Provide redirection as needed. Assess for escalating behavior; address immediately and inform ED provider.  Assess family dynamic and appropriateness for visitation as needed: Yes.  ; If necessary, describe findings:  Educate the patient/family about BHU procedures/visitation: No.; If necessary, describe findings: not necessary at this time

## 2015-03-13 NOTE — ED Notes (Signed)
Pt denies suicidal ideation at this time, denies HI at this time, denies hallucinations.  Pt alert and oriented, cooperative and calm.  Skin warm and dry.
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
## 2015-03-13 NOTE — ED Notes (Signed)
Spoke with caregiver at group home where pt resides.  He states he will be back to pick up pt and take him home after 1030 today.  Still awaiting social work consult.

## 2015-03-13 NOTE — ED Notes (Signed)
BEHAVIORAL HEALTH ROUNDING Patient sleeping: Yes.   Patient alert and oriented: not applicable Behavior appropriate: Yes.  ; If no, describe:  Nutrition and fluids offered: Yes  Toileting and hygiene offered: Yes  Sitter present: no Law enforcement present: Yes

## 2015-03-13 NOTE — ED Notes (Signed)
Patient assigned to appropriate care area. Patient oriented to unit/care area: Informed that, for their safety, care areas are designed for safety and monitored by security cameras at all times; and visiting hours explained to patient. Patient verbalizes understanding, and verbal contract for safety obtained. 

## 2015-03-13 NOTE — ED Notes (Signed)
Social work at bedside.  

## 2016-02-20 ENCOUNTER — Emergency Department
Admission: EM | Admit: 2016-02-20 | Discharge: 2016-02-20 | Disposition: A | Discharge status: Home / Self Care | Payer: Medicare Other | Attending: Emergency Medicine | Admitting: Emergency Medicine

## 2016-02-20 DIAGNOSIS — F17200 Nicotine dependence, unspecified, uncomplicated: Secondary | ICD-10-CM | POA: Diagnosis not present

## 2016-02-20 DIAGNOSIS — F209 Schizophrenia, unspecified: Secondary | ICD-10-CM

## 2016-02-20 DIAGNOSIS — F319 Bipolar disorder, unspecified: Secondary | ICD-10-CM | POA: Diagnosis not present

## 2016-02-20 DIAGNOSIS — F259 Schizoaffective disorder, unspecified: Secondary | ICD-10-CM | POA: Insufficient documentation

## 2016-02-20 DIAGNOSIS — Z046 Encounter for general psychiatric examination, requested by authority: Secondary | ICD-10-CM | POA: Diagnosis present

## 2016-02-20 DIAGNOSIS — F200 Paranoid schizophrenia: Secondary | ICD-10-CM | POA: Diagnosis present

## 2016-02-20 LAB — CBC WITH DIFFERENTIAL/PLATELET
Basophils Absolute: 0 10*3/uL (ref 0–0.1)
Basophils Relative: 0 %
EOS ABS: 0.1 10*3/uL (ref 0–0.7)
Eosinophils Relative: 1 %
HEMATOCRIT: 43.3 % (ref 40.0–52.0)
HEMOGLOBIN: 14.8 g/dL (ref 13.0–18.0)
LYMPHS ABS: 1.1 10*3/uL (ref 1.0–3.6)
MCH: 32.7 pg (ref 26.0–34.0)
MCHC: 34.1 g/dL (ref 32.0–36.0)
MCV: 95.9 fL (ref 80.0–100.0)
Monocytes Absolute: 0.7 10*3/uL (ref 0.2–1.0)
Monocytes Relative: 8 %
Neutro Abs: 7.1 10*3/uL — ABNORMAL HIGH (ref 1.4–6.5)
Neutrophils Relative %: 79 %
Platelets: 200 10*3/uL (ref 150–440)
RBC: 4.52 MIL/uL (ref 4.40–5.90)
RDW: 14.3 % (ref 11.5–14.5)
WBC: 9 10*3/uL (ref 3.8–10.6)

## 2016-02-20 LAB — COMPREHENSIVE METABOLIC PANEL
ALBUMIN: 4.9 g/dL (ref 3.5–5.0)
ALT: 26 U/L (ref 17–63)
AST: 30 U/L (ref 15–41)
Alkaline Phosphatase: 85 U/L (ref 38–126)
Anion gap: 9 (ref 5–15)
BUN: 18 mg/dL (ref 6–20)
CHLORIDE: 107 mmol/L (ref 101–111)
CO2: 25 mmol/L (ref 22–32)
CREATININE: 1.06 mg/dL (ref 0.61–1.24)
Calcium: 9.4 mg/dL (ref 8.9–10.3)
GFR calc Af Amer: >60 mL/min (ref >60)
GLUCOSE: 104 mg/dL — AB (ref 65–99)
POTASSIUM: 4.2 mmol/L (ref 3.5–5.1)
Sodium: 141 mmol/L (ref 135–145)
Total Bilirubin: 0.7 mg/dL (ref 0.3–1.2)
Total Protein: 7.8 g/dL (ref 6.5–8.1)

## 2016-02-20 LAB — ACETAMINOPHEN LEVEL: Acetaminophen (Tylenol), Serum: <10 ug/mL — ABNORMAL LOW (ref 10–30)

## 2016-02-20 LAB — URINE DRUG SCREEN, QUALITATIVE (ARMC ONLY)
AMPHETAMINES, UR SCREEN: NOT DETECTED
BENZODIAZEPINE, UR SCRN: POSITIVE — AB
Barbiturates, Ur Screen: NOT DETECTED
Cannabinoid 50 Ng, Ur ~~LOC~~: NOT DETECTED
Cocaine Metabolite,Ur ~~LOC~~: NOT DETECTED
MDMA (ECSTASY) UR SCREEN: NOT DETECTED
METHADONE SCREEN, URINE: NOT DETECTED
OPIATE, UR SCREEN: NOT DETECTED
Phencyclidine (PCP) Ur S: NOT DETECTED
Tricyclic, Ur Screen: POSITIVE — AB

## 2016-02-20 LAB — TSH: TSH: 1.516 u[IU]/mL (ref 0.350–4.500)

## 2016-02-20 LAB — SALICYLATE LEVEL: Salicylate Lvl: <4 mg/dL (ref 2.8–30.0)

## 2016-02-20 LAB — ETHANOL: Alcohol, Ethyl (B): <5 mg/dL (ref <5)

## 2016-02-20 NOTE — ED Notes (Signed)
Pt noted to be singing at this time, pt not talking to anyone in particular, pt noted to have rapid, pressured speech when speaking to staff but is otherwise polite and cooperative.

## 2016-02-20 NOTE — ED Notes (Signed)

## 2016-02-20 NOTE — Discharge Instructions (Signed)
Schizophrenia °Schizophrenia is a mental illness. It may cause disturbed or disorganized thinking, speech, or behavior. People with schizophrenia have problems functioning in one or more areas of life: work, school, home, or relationships. People with schizophrenia are at increased risk for suicide, certain chronic physical illnesses, and unhealthy behaviors, such as smoking and drug use. °People who have family members with schizophrenia are at higher risk of developing the illness. Schizophrenia affects men and women equally but usually appears at an earlier age (teenage or early adult years) in men.  °SYMPTOMS °The earliest symptoms are often subtle (prodrome) and may go unnoticed until the illness becomes more severe (first-break psychosis). Symptoms of schizophrenia may be continuous or may come and go in severity. Episodes often are triggered by major life events, such as family stress, college, military service, marriage, pregnancy or child birth, divorce, or loss of a loved one. People with schizophrenia may see, hear, or feel things that do not exist (hallucinations). They may have false beliefs in spite of obvious proof to the contrary (delusions). Sometimes speech is incoherent or behavior is odd or withdrawn.  °DIAGNOSIS °Schizophrenia is diagnosed through an assessment by your caregiver. Your caregiver will ask questions about your thoughts, behavior, mood, and ability to function in daily life. Your caregiver may ask questions about your medical history and use of alcohol or drugs, including prescription medication. Your caregiver may also order blood tests and imaging exams. Certain medical conditions and substances can cause symptoms that resemble schizophrenia. Your caregiver may refer you to a mental health specialist for evaluation. There are three major criterion for a diagnosis of schizophrenia: °· Two or more of the following five symptoms are present for a month or longer: °¨ Delusions. Often  the delusions are that you are being attacked, harassed, cheated, persecuted or conspired against (persecutory delusions). °¨ Hallucinations.   °¨ Disorganized speech that does not make sense to others. °¨ Grossly disorganized (confused or unfocused) behavior or extremely overactive or underactive motor activity (catatonia). °¨ Negative symptoms such as bland or blunted emotions (flat affect), loss of will power (avolition), and withdrawal from social contacts (social isolation). °· Level of functioning in one or more major areas of life (work, school, relationships, or self-care) is markedly below the level of functioning before the onset of illness.   °· There are continuous signs of illness (either mild symptoms or decreased level of functioning) for at least 6 months or longer. °TREATMENT  °Schizophrenia is a long-term illness. It is best controlled with continuous treatment rather than treatment only when symptoms occur. The following treatments are used to manage schizophrenia: °· Medication--Medication is the most effective and important form of treatment for schizophrenia. Antipsychotic medications are usually prescribed to help manage schizophrenia. Other types of medication may be added to relieve any symptoms that may occur despite the use of antipsychotic medications. °· Counseling or talk therapy--Individual, group, or family counseling may be helpful in providing education, support, and guidance. Many people with schizophrenia also benefit from social skills and job skills (vocational) training. °A combination of medication and counseling is best for managing the disorder over time. A procedure in which electricity is applied to the brain through the scalp (electroconvulsive therapy) may be used to treat catatonic schizophrenia or schizophrenia in people who cannot take or do not respond to medication and counseling. °  °This information is not intended to replace advice given to you by your health  care provider. Make sure you discuss any questions you have with   your health care provider. °  °Document Released: 09/19/2000 Document Revised: 10/13/2014 Document Reviewed: 12/15/2012 °Elsevier Interactive Patient Education ©2016 [employer] ° °

## 2016-02-20 NOTE — ED Notes (Signed)
First Nurse called to tell me this patient's transportation has arrived.

## 2016-02-20 NOTE — ED Notes (Signed)
NAD noted at this time. Pt in NAD. Pt resting in bed, intermittently talking to himself. Will continue to monitor at this time.

## 2016-02-20 NOTE — ED Notes (Signed)
The patient was dressed out into the required purple scrubs. He was then left with the triage nurse in triage room 2. No incidents.

## 2016-02-20 NOTE — ED Notes (Signed)
Pt is agitated, states the police brought him here for assistance in finding a new place to live... Pt has pressures speech in triage and does not want to explain why he left the last place he was at.. Pt is asking for a meal in triage .Marland KitchenMarland Kitchen

## 2016-02-20 NOTE — ED Notes (Signed)
Pt up using restroom without assistance at this time.

## 2016-02-20 NOTE — Consult Note (Signed)
[facility] Consult   Reason for Consult:  Consult for this 65 year old man with a history of chronic mental illness probably schizophrenia. Picked up by local law enforcement after sleeping in the park at night Referring Physician:  Joni Fears Patient Identification: Joseph Holt. MRN:  [id number] Principal Diagnosis: Chronic schizophrenia ([place]) Diagnosis:   Patient Active Problem List   Diagnosis Date Noted  . Chronic schizophrenia ([place]) [F20.9] 02/21/2015  . Social maladjustment [Z60.9] 02/21/2015    Total Time spent with patient: 1 hour  Subjective:   Joseph Holt. is a 65 y.o. male patient admitted with "I left the group home".  HPI:  Patient interviewed. Old chart reviewed. Labs and vitals reviewed. He 33-year-old man with a history of schizophrenia or schizoaffective disorder. He said that he is been living at the same group home for 10 years. He has a multitude of minor complaints about it including the food being inadequate and the entertainment being limited. He says that yesterday he walked off from there on his own. He went to a [employer] and got someone to buy him a meal. He ended up spending the night sleeping in a public park. Police picked him up and brought him in here today quite willingly. Patient denies having any acute symptoms. He denies that his mood is been depressed or bad. Says he feels good most of the time. Sleeps fine at night. No change in appetite. Patient denies any suicidal or homicidal ideation. He denies having any current auditory or visual hallucinations. Doesn't report any really clear-cut new stress. Says there have been any changes to his medicine and he is been compliant with them. Denies substance abuse.  Social history: Patient resides in a group home. The more he talked about it I was unclear whether he has been in the current one 10 years or if he had recently relocated. In either case he has a safe place to stay. Has close  psychiatric follow-up in the community. He has several brothers who are his closest family but they all live in [place]  Medical history: Patient denies any significant medical problems. Denies heart disease high blood pressure diabetes  Substance abuse history: Does not drink alcohol regularly. Doesn't abuse any drugs. Denies any past alcohol or drug use  Past Psychiatric History: Long-standing psychiatric disorder probably essentially lifelong. Several prior hospitalizations but hasn't been in a psychiatric hospital in years. Denies any history of suicide attempts  Risk to Self: Is patient at risk for suicide?: No Risk to Others:   Prior Inpatient Therapy:   Prior Outpatient Therapy:    Past Medical History:  Past Medical History  Diagnosis Date  . Schizo affective schizophrenia ([place])   . Bipolar 1 disorder [facility])     Past Surgical History  Procedure Laterality Date  . Appendectomy    . Hernia repair     Family History: No family history on file. Family Psychiatric  History: Patient says that he had an aunt who told him that there might be bipolar disorder in his family. That's about a specific as he can get. Social History:  History  Alcohol Use No     History  Drug Use No    Social History   Social History  . Marital Status: Single    Spouse Name: N/A  . Number of Children: N/A  . Years of Education: N/A   Social History Main Topics  . Smoking status: Current Every Day Smoker  .  Smokeless tobacco: None  . Alcohol Use: No  . Drug Use: No  . Sexual Activity: Not Asked   Other Topics Concern  . None   Social History Narrative   Additional Social History:    Allergies:  No Known Allergies  Labs:  Results for orders placed or performed during the hospital encounter of 02/20/16 (from the past 48 hour(s))  Comprehensive metabolic panel     Status: Abnormal   Collection Time: 02/20/16 12:22 PM  Result Value Ref Range   Sodium 141 135 - 145 mmol/L    Potassium 4.2 3.5 - 5.1 mmol/L   Chloride 107 101 - 111 mmol/L   CO2 25 22 - 32 mmol/L   Glucose, Bld 104 (H) 65 - 99 mg/dL   BUN 18 6 - 20 mg/dL   Creatinine, Ser 1.06 0.61 - 1.24 mg/dL   Calcium 9.4 8.9 - 10.3 mg/dL   Total Protein 7.8 6.5 - 8.1 g/dL   Albumin 4.9 3.5 - 5.0 g/dL   AST 30 15 - 41 U/L   ALT 26 17 - 63 U/L   Alkaline Phosphatase 85 38 - 126 U/L   Total Bilirubin 0.7 0.3 - 1.2 mg/dL   GFR calc non Af Amer >60 >60 mL/min   GFR calc Af Amer >60 >60 mL/min    Comment: (NOTE) The eGFR has been calculated using the CKD EPI equation. This calculation has not been validated in all clinical situations. eGFR's persistently <60 mL/min signify possible Chronic Kidney Disease.    Anion gap 9 5 - 15  CBC with Differential/Platelet     Status: Abnormal   Collection Time: 02/20/16 12:22 PM  Result Value Ref Range   WBC 9.0 3.8 - 10.6 K/uL   RBC 4.52 4.40 - 5.90 MIL/uL   Hemoglobin 14.8 13.0 - 18.0 g/dL   HCT 43.3 40.0 - 52.0 %   MCV 95.9 80.0 - 100.0 fL   MCH 32.7 26.0 - 34.0 pg   MCHC 34.1 32.0 - 36.0 g/dL   RDW 14.3 11.5 - 14.5 %   Platelets 200 150 - 440 K/uL   Neutrophils Relative % 79% %   Neutro Abs 7.1 (H) 1.4 - 6.5 K/uL   Lymphocytes Relative 12% %   Lymphs Abs 1.1 1.0 - 3.6 K/uL   Monocytes Relative 8% %   Monocytes Absolute 0.7 0.2 - 1.0 K/uL   Eosinophils Relative 1% %   Eosinophils Absolute 0.1 0 - 0.7 K/uL   Basophils Relative 0% %   Basophils Absolute 0.0 0 - 0.1 K/uL  TSH     Status: None   Collection Time: 02/20/16 12:22 PM  Result Value Ref Range   TSH 1.516 0.350 - 4.500 uIU/mL  Ethanol     Status: None   Collection Time: 02/20/16 12:22 PM  Result Value Ref Range   Alcohol, Ethyl (B) <5 <5 mg/dL    Comment:        LOWEST DETECTABLE LIMIT FOR SERUM ALCOHOL IS 5 mg/dL FOR MEDICAL PURPOSES ONLY   Acetaminophen level     Status: Abnormal   Collection Time: 02/20/16 12:22 PM  Result Value Ref Range   Acetaminophen (Tylenol), Serum <10 (L) 10 -  30 ug/mL    Comment:        THERAPEUTIC CONCENTRATIONS VARY SIGNIFICANTLY. A RANGE OF 10-30 ug/mL MAY BE AN EFFECTIVE CONCENTRATION FOR MANY PATIENTS. HOWEVER, SOME ARE BEST TREATED AT CONCENTRATIONS OUTSIDE THIS RANGE. ACETAMINOPHEN CONCENTRATIONS >150 ug/mL AT 4 HOURS AFTER INGESTION  AND >50 ug/mL AT 12 HOURS AFTER INGESTION ARE OFTEN ASSOCIATED WITH TOXIC REACTIONS.   Salicylate level     Status: None   Collection Time: 02/20/16 12:22 PM  Result Value Ref Range   Salicylate Lvl <0.0 2.8 - 30.0 mg/dL    No current facility-administered medications for this encounter.   Current Outpatient Prescriptions  Medication Sig Dispense Refill  . buPROPion (WELLBUTRIN) 75 MG tablet Take 75 mg by mouth 2 (two) times daily.    . cetirizine (ZYRTEC) 10 MG tablet Take 10 mg by mouth daily.    Marland Kitchen donepezil (ARICEPT) 5 MG tablet Take 5 mg by mouth every morning.    . fluticasone (VERAMYST) 27.5 MCG/SPRAY nasal spray Place 1 spray into the nose daily.    . Fluticasone-Salmeterol (ADVAIR) 250-50 MCG/DOSE AEPB Inhale 1 puff into the lungs 2 (two) times daily.    Marland Kitchen gabapentin (NEURONTIN) 400 MG capsule Take 400 mg by mouth every morning.    Marland Kitchen guaiFENesin (MUCINEX) 600 MG 12 hr tablet Take 600 mg by mouth 2 (two) times daily.    . haloperidol (HALDOL) 5 MG tablet Take 5 mg by mouth 2 (two) times daily.    Marland Kitchen LORazepam (ATIVAN) 0.5 MG tablet Take 0.5 mg by mouth 2 (two) times daily.    Marland Kitchen omega-3 acid ethyl esters (LOVAZA) 1 G capsule Take 4 capsules by mouth 2 (two) times daily.    Marland Kitchen omeprazole (PRILOSEC) 20 MG capsule Take 20 mg by mouth every morning.    Marland Kitchen QUEtiapine (SEROQUEL) 400 MG tablet Take 400 mg by mouth at bedtime.    . simvastatin (ZOCOR) 20 MG tablet Take 20 mg by mouth at bedtime.    . tamsulosin (FLOMAX) 0.4 MG CAPS capsule Take 0.4 mg by mouth at bedtime.    . Vitamin D, Ergocalciferol, (DRISDOL) 50000 UNITS CAPS capsule Take 50,000 Units by mouth every 7 (seven) days.       Musculoskeletal: Strength & Muscle Tone: within normal limits Gait & Station: normal Patient leans: N/A  Psychiatric Specialty Exam: Review of Systems  Constitutional: Negative.   HENT: Negative.   Eyes: Negative.   Respiratory: Negative.   Cardiovascular: Negative.   Gastrointestinal: Negative.   Musculoskeletal: Negative.   Skin: Negative.   Neurological: Negative.   Psychiatric/Behavioral: Negative for depression, suicidal ideas, hallucinations, memory loss and substance abuse. The patient is not nervous/anxious and does not have insomnia.     Blood pressure 116/86, pulse 82, temperature 97.5 F (36.4 C), temperature source Oral, resp. rate 18, height 6' 1"  (1.854 m), weight 70.308 kg (155 lb), SpO2 95 %.Body mass index is 20.45 kg/(m^2).  General Appearance: Casual  Eye Contact::  Good  Speech:  Normal Rate  Volume:  Normal  Mood:  Euthymic  Affect:  Full Range  Thought Process:  Goal Directed  Orientation:  Full (Time, Place, and Person)  Thought Content:  Negative  Suicidal Thoughts:  No  Homicidal Thoughts:  No  Memory:  Immediate;   Good Recent;   Good Remote;   Good  Judgement:  Fair  Insight:  Fair  Psychomotor Activity:  Decreased  Concentration:  Fair  Recall:  [place] of Knowledge:Fair  Language: Fair  Akathisia:  No  Handed:  Right  AIMS (if indicated):     Assets:  Communication Skills Desire for Improvement Financial Resources/Insurance Housing Physical Health Resilience  ADL's:  Intact  Cognition: WNL  Sleep:      Treatment Plan Summary: Medication management and  Plan 65 year old man with schizophrenia. Wandered off from the group home essentially as far as I can tell because he was bored and wanted to have a [employer]. He is pleasant and does not appear to be particularly psychotic now. No evidence of him being currently dangerous. Perfectly agreeable to continuing his medicine. Patient can be returned back to his group home. He  tried to ask me if I could move into a different group home and I explained that that was not possible here but that he could speak to his care management and providers to see if they could help him with that. He accepted the plan. No need for IVC. Case reviewed with ER doctor.  Disposition: Patient does not meet criteria for psychiatric inpatient admission. Supportive therapy provided about ongoing stressors.  Alethia Berthold, MD 02/20/2016 4:15 PM

## 2016-02-20 NOTE — ED Notes (Signed)
Per Mel Almond, CSW, Nance Pear driving in from [place] to pick patient up. Pt amenable to going back to his group home at this time.

## 2016-02-20 NOTE — ED Provider Notes (Signed)
Time Seen: Approximately ----------------------------------------- 2:14 PM on 02/20/2016 -----------------------------------------    I have reviewed the triage notes  Chief Complaint: Psychiatric Evaluation   History of Present Illness: Joseph Holt. is a 65 y.o. male who was brought here by local police for assistance and finding him a place to live. Patient has long-term history of schizoaffective and schizophrenia disease. He states he has been on his medications. Patient denies any suicidal thoughts, homicidal thoughts, hallucinations. The patient states he's become disgruntled with the group home that he staying and was walking on the street. He states he elected to establish himself with another group home. He states that he would like to establish himself at a residence that he used to reside. He states that he's not been getting enough T am but denies any physical or verbal mist treatment. Patient denies any physical complaints or any recent illicit ingestions.  Past Medical History  Diagnosis Date  . Schizo affective schizophrenia ([place])   . Bipolar 1 disorder [facility])     Patient Active Problem List   Diagnosis Date Noted  . Chronic schizophrenia ([place]) 02/21/2015  . Social maladjustment 02/21/2015    Past Surgical History  Procedure Laterality Date  . Appendectomy    . Hernia repair      Past Surgical History  Procedure Laterality Date  . Appendectomy    . Hernia repair      Current Outpatient Rx  Name  Route  Sig  Dispense  Refill  . buPROPion (WELLBUTRIN) 75 MG tablet   Oral   Take 75 mg by mouth 2 (two) times daily.         . cetirizine (ZYRTEC) 10 MG tablet   Oral   Take 10 mg by mouth daily.         Marland Kitchen donepezil (ARICEPT) 5 MG tablet   Oral   Take 5 mg by mouth every morning.         . fluticasone (VERAMYST) 27.5 MCG/SPRAY nasal spray   Nasal   Place 1 spray into the nose daily.         . Fluticasone-Salmeterol (ADVAIR) 250-50 MCG/DOSE  AEPB   Inhalation   Inhale 1 puff into the lungs 2 (two) times daily.         Marland Kitchen gabapentin (NEURONTIN) 400 MG capsule   Oral   Take 400 mg by mouth every morning.         Marland Kitchen guaiFENesin (MUCINEX) 600 MG 12 hr tablet   Oral   Take 600 mg by mouth 2 (two) times daily.         . haloperidol (HALDOL) 5 MG tablet   Oral   Take 5 mg by mouth 2 (two) times daily.         Marland Kitchen LORazepam (ATIVAN) 0.5 MG tablet   Oral   Take 0.5 mg by mouth 2 (two) times daily.         Marland Kitchen omega-3 acid ethyl esters (LOVAZA) 1 G capsule   Oral   Take 4 capsules by mouth 2 (two) times daily.         Marland Kitchen omeprazole (PRILOSEC) 20 MG capsule   Oral   Take 20 mg by mouth every morning.         Marland Kitchen QUEtiapine (SEROQUEL) 400 MG tablet   Oral   Take 400 mg by mouth at bedtime.         . simvastatin (ZOCOR) 20 MG tablet   Oral  Take 20 mg by mouth at bedtime.         . tamsulosin (FLOMAX) 0.4 MG CAPS capsule   Oral   Take 0.4 mg by mouth at bedtime.         . Vitamin D, Ergocalciferol, (DRISDOL) 50000 UNITS CAPS capsule   Oral   Take 50,000 Units by mouth every 7 (seven) days.           Allergies:  Review of patient's allergies indicates no known allergies.  Family History: No family history on file.  Social History: Social History  Substance Use Topics  . Smoking status: Current Every Day Smoker  . Smokeless tobacco: None  . Alcohol Use: No     Review of Systems:   10 point review of systems was performed and was otherwise negative:  Constitutional: No fever Eyes: No visual disturbances ENT: No sore throat, ear pain Cardiac: No chest pain Respiratory: No shortness of breath, wheezing, or stridor Abdomen: No abdominal pain, no vomiting, No diarrhea Endocrine: No weight loss, No night sweats Extremities: No peripheral edema, cyanosis Skin: No rashes, easy bruising Neurologic: No focal weakness, trouble with speech or swollowing Urologic: No dysuria, Hematuria, or  urinary frequency   Physical Exam:  ED Triage Vitals  Enc Vitals Group     BP 02/20/16 1218 146/92 mmHg     Pulse Rate 02/20/16 1218 104     Resp 02/20/16 1218 20     Temp 02/20/16 1218 98.3 F (36.8 C)     Temp Source 02/20/16 1218 Oral     SpO2 --      Weight 02/20/16 1218 155 lb (70.308 kg)     Height 02/20/16 1218 '6\' 1"'$  (1.854 m)     Head Cir --      Peak Flow --      Pain Score --      Pain Loc --      Pain Edu? --      Excl. in [place]? --     General: Awake , Alert , and Oriented times 3; GCS 15 Head: Normal cephalic , atraumatic Eyes: Pupils equal , round, reactive to light Nose/Throat: No nasal drainage, patent upper airway without erythema or exudate. Poor dentition Neck: Supple, Full range of motion, No anterior adenopathy or palpable thyroid masses Lungs: Clear to ascultation without wheezes , rhonchi, or rales Heart: Regular rate, regular rhythm without murmurs , gallops , or rubs Abdomen: Soft, non tender without rebound, guarding , or rigidity; bowel sounds positive and symmetric in all 4 quadrants. No organomegaly .        Extremities: 2 plus symmetric pulses. No edema, clubbing or cyanosis Neurologic: normal ambulation, Motor symmetric without deficits, sensory intact Skin: warm, dry, no rashes   Labs:   All laboratory work was reviewed including any pertinent negatives or positives listed below:  Labs Reviewed  COMPREHENSIVE METABOLIC PANEL  CBC WITH DIFFERENTIAL/PLATELET  TSH  ETHANOL  URINE DRUG SCREEN, QUALITATIVE ([place])  ACETAMINOPHEN LEVEL  SALICYLATE LEVEL  Reviewed the patient's labs showed no significant findings    ED Course:  Patient's stay here was uneventful and the patient was seen and evaluated by psychiatry and social work services. The patient agrees to return back to his group home. He is not acutely psychotic at this time. Patient did not require any adjustments of his medications. He was referred to follow up with his  psychiatrist.   Assessment:  History of schizoaffective disease  Plan: Outpatient management The patient will be picked up by his normal group home coordinator at approximately 8 PM tonight           Daymon Larsen, MD 02/20/16 1925

## 2016-02-20 NOTE — ED Notes (Signed)
NAD noted at this time. Pt alert and semi oriented at this time, pt disoriented to the month at this time. Pt continues to intermittently sing to himself, no otherwise distress noted at this time.

## 2016-02-20 NOTE — BH Assessment (Signed)
Assessment Note  Joseph Holt. is an 65 y.o. male Who presents to the ER via Law Enforcement due to seeing him walking down the street. Per the report of the patient, he was "walking in the street and that's when they spot me." He further states, he is currently living in the [facility] and administrator is "Mr. Joseph Holt." On yesterday, he signed his self out of the home and didn't return. On last night, he slept in the park.  Patient denies SI/HI and AV/H. During the interview, he was calm, cooperative and pleasant. He reports of having no mental health concerns. He is requesting to go home.    Past Medical History:  Past Medical History  Diagnosis Date  . Schizo affective schizophrenia ([place])   . Bipolar 1 disorder [facility])     Past Surgical History  Procedure Laterality Date  . Appendectomy    . Hernia repair      Family History: No family history on file.  Social History:  reports that he has been smoking.  He does not have any smokeless tobacco history on file. He reports that he does not drink alcohol or use illicit drugs.  Additional Social History:  Alcohol / Drug Use Pain Medications: See PTA Prescriptions: See PTA Over the Counter: See PTA History of alcohol / drug use?: No history of alcohol / drug abuse Longest period of sobriety (when/how long): Reports of no use Negative Consequences of Use:  (Reports of no use) Withdrawal Symptoms:  (Reports of none)  CIWA: CIWA-Ar BP: 116/86 mmHg Pulse Rate: 82 COWS:    Allergies: No Known Allergies  Home Medications:  (Not in a hospital admission)  OB/GYN Status:  No LMP for male patient.  General Assessment Data Location of Assessment: [facility] ED TTS Assessment: In system Is this a Tele or Face-to-Face Assessment?: Face-to-Face Is this an Initial Assessment or a Re-assessment for this encounter?: Initial Assessment Marital status: Single Maiden name: n/a Is patient pregnant?: No Pregnancy Status: No Living  Arrangements: Other (Comment) (Hospice House (per patient's report)) Can pt return to current living arrangement?: Yes Admission Status: Voluntary Is patient capable of signing voluntary admission?: Yes Referral Source: Self/Family/Friend Brewing technologist) Insurance type: Medicare  Medical Screening Exam ([place]) Medical Exam completed: Yes  Crisis Care Plan Living Arrangements: Other (Comment) ([place] (per patient's report)) Legal Guardian: Other: (Reports of none) Name of Psychiatrist: Reports of none Name of Therapist: Reports of none  Education Status Is patient currently in school?: No Current Grade: n/a Highest grade of school patient has completed: Master's Degree Name of school: n/a Contact person: n/a  Risk to self with the past 6 months Suicidal Ideation: No Has patient been a risk to self within the past 6 months prior to admission? : No Suicidal Intent: No Has patient had any suicidal intent within the past 6 months prior to admission? : No Is patient at risk for suicide?: No Suicidal Plan?: No Has patient had any suicidal plan within the past 6 months prior to admission? : No Access to Means: No What has been your use of drugs/alcohol within the last 12 months?: Reports of no use Previous Attempts/Gestures: No How many times?: 0 Other Self Harm Risks: Reports of none Triggers for Past Attempts: None known Intentional Self Injurious Behavior: None Family Suicide History: No Recent stressful life event(s): Other (Comment) (Reports of none) Persecutory voices/beliefs?: No Depression: No Depression Symptoms:  (Reports of none) Substance abuse history and/or treatment  for substance abuse?: No Suicide prevention information given to non-admitted patients: Not applicable  Risk to Others within the past 6 months Homicidal Ideation: No Does patient have any lifetime risk of violence toward others beyond the six months prior to admission? :  No Thoughts of Harm to Others: No Current Homicidal Intent: No Current Homicidal Plan: No Access to Homicidal Means: No Identified Victim: Reports of none History of harm to others?: No Assessment of Violence: None Noted Violent Behavior Description: Reports of none Does patient have access to weapons?: No Criminal Charges Pending?: No Does patient have a court date: No Is patient on probation?: No  Psychosis Hallucinations: None noted Delusions: None noted  Mental Status Report Appearance/Hygiene: In hospital gown, In scrubs, Unremarkable Eye Contact: Good Motor Activity: Unremarkable, Freedom of movement Speech: Logical/coherent Level of Consciousness: Alert Mood: Pleasant Affect: Appropriate to circumstance Anxiety Level: None Thought Processes: Coherent, Relevant Judgement: Unimpaired Orientation: Place, Person, Time, Situation, Appropriate for developmental age Obsessive Compulsive Thoughts/Behaviors: None  Cognitive Functioning Concentration: Normal Memory: Recent Intact, Remote Intact IQ: Average Insight: Fair Impulse Control: Fair Appetite: Good Weight Loss: 0 Weight Gain: 0 Sleep: No Change Total Hours of Sleep: 8 Vegetative Symptoms: None  ADLScreening [facility] Assessment Services) Patient's cognitive ability adequate to safely complete daily activities?: Yes Patient able to express need for assistance with ADLs?: Yes Independently performs ADLs?: Yes (appropriate for developmental age)  Prior Inpatient Therapy Prior Inpatient Therapy: No Prior Therapy Dates: Reports of none Prior Therapy Facilty/Provider(s): Reports of none Reason for Treatment: Reports of none  Prior Outpatient Therapy Prior Outpatient Therapy: No Prior Therapy Dates: Reports of none Prior Therapy Facilty/Provider(s): Reports of none Reason for Treatment: Reports of none Does patient have an ACCT team?: No Does patient have Intensive In-House Services?  : No Does patient have  [facility] services? : No Does patient have P4CC services?: No  ADL Screening (condition at time of admission) Patient's cognitive ability adequate to safely complete daily activities?: Yes Is the patient deaf or have difficulty hearing?: No Does the patient have difficulty seeing, even when wearing glasses/contacts?: No Does the patient have difficulty concentrating, remembering, or making decisions?: No Patient able to express need for assistance with ADLs?: Yes Does the patient have difficulty dressing or bathing?: No Independently performs ADLs?: Yes (appropriate for developmental age) Does the patient have difficulty walking or climbing stairs?: No Weakness of Legs: None Weakness of Arms/Hands: None  Home Assistive Devices/Equipment Home Assistive Devices/Equipment: None  Therapy Consults (therapy consults require a physician order) PT Evaluation Needed: No OT Evalulation Needed: No SLP Evaluation Needed: No Abuse/Neglect Assessment (Assessment to be complete while patient is alone) Physical Abuse: Denies Verbal Abuse: Denies Sexual Abuse: Denies Exploitation of patient/patient's resources: Denies Self-Neglect: Denies Values / Beliefs Cultural Requests During Hospitalization: None Spiritual Requests During Hospitalization: None Consults Spiritual Care Consult Needed: No Social Work Consult Needed: No Regulatory affairs officer (For Healthcare) Does patient have an advance directive?: No Would patient like information on creating an advanced directive?: No - patient declined information    Additional Information 1:1 In Past 12 Months?: No CIRT Risk: No Elopement Risk: No Does patient have medical clearance?: Yes  Child/Adolescent Assessment Running Away Risk: Denies (Patient is adult)  Disposition:  Disposition Initial Assessment Completed for this Encounter: Yes Disposition of Patient: Other dispositions (ER MD ordered Psych Consult) Other disposition(s): Other (Comment)  (ER MD ordered Psych Consult)  On Site Evaluation by:   Reviewed with Physician:    Gunnar Fusi  MS, LCAS, LPC, NCC, CCSI Therapeutic Triage Specialist 02/20/2016 6:28 PM

## 2016-02-20 NOTE — ED Notes (Signed)
Report given to Efraim Kaufmann, RN.

## 2016-02-20 NOTE — ED Provider Notes (Signed)
-----------------------------------------   4:52 PM on 02/20/2016 -----------------------------------------  Dr. Weber Cooks has evaluated the patient and recommends discharge back to his group home. We'll DC home with return precautions.  Joanne Gavel, MD 02/20/16 [phone]

## 2016-02-20 NOTE — ED Notes (Signed)
Pt given food tray at this time. NAD noted. Pt alert and laying in bed comfortably. Denies further needs or concerns at this time. Will continue to monitor q15 min to assess for further patient needs at this time.

## 2016-02-20 NOTE — Progress Notes (Addendum)
Clinical Education officer, museum ([place]) contacted Nance Pear [phone]) group home owner of [facility] ([address] [place], [place] [address]). Per Elberta Fortis patient left the group home last night around 9 pm and was found by police today. Per Elberta Fortis he searched for patient last night and called the police. Per Elberta Fortis he will pick patient up from the ED today and patient can return to the group home.   CSW met with patient and made him aware that he is cleared for discharge and ready to return to the group home. Patient stated that he doesn't want to return to the group home. CSW explained that we don't have anywhere else for patient to go now and the best option is to return to the group home. Patient stated that he was okay with returning to the group home and agreeable for Elberta Fortis to pick him up.  Per Elberta Fortis he is driving from [place], [place] and will come straight to [facility] to pick patient up. RN aware of above. Please reconsult if future social work needs arise. CSW signing off.   Blima Rich, LCSW [phone]

## 2016-03-03 ENCOUNTER — Encounter: Payer: Self-pay | Admitting: Emergency Medicine

## 2016-03-03 ENCOUNTER — Emergency Department
Admission: EM | Admit: 2016-03-03 | Discharge: 2016-03-03 | Disposition: A | Discharge status: Other Acute Inpatient Hospital | Payer: Medicare Other | Attending: Emergency Medicine | Admitting: Emergency Medicine

## 2016-03-03 DIAGNOSIS — F0390 Unspecified dementia without behavioral disturbance: Secondary | ICD-10-CM

## 2016-03-03 DIAGNOSIS — F319 Bipolar disorder, unspecified: Secondary | ICD-10-CM | POA: Diagnosis not present

## 2016-03-03 DIAGNOSIS — F209 Schizophrenia, unspecified: Secondary | ICD-10-CM

## 2016-03-03 DIAGNOSIS — F17210 Nicotine dependence, cigarettes, uncomplicated: Secondary | ICD-10-CM | POA: Diagnosis not present

## 2016-03-03 DIAGNOSIS — Z79899 Other long term (current) drug therapy: Secondary | ICD-10-CM | POA: Diagnosis not present

## 2016-03-03 DIAGNOSIS — R4182 Altered mental status, unspecified: Secondary | ICD-10-CM | POA: Diagnosis present

## 2016-03-03 LAB — URINE DRUG SCREEN, QUALITATIVE (ARMC ONLY)
AMPHETAMINES, UR SCREEN: NOT DETECTED
Barbiturates, Ur Screen: NOT DETECTED
Benzodiazepine, Ur Scrn: POSITIVE — AB
COCAINE METABOLITE, UR ~~LOC~~: NOT DETECTED
Cannabinoid 50 Ng, Ur ~~LOC~~: NOT DETECTED
MDMA (ECSTASY) UR SCREEN: NOT DETECTED
METHADONE SCREEN, URINE: NOT DETECTED
OPIATE, UR SCREEN: NOT DETECTED
Phencyclidine (PCP) Ur S: NOT DETECTED
TRICYCLIC, UR SCREEN: POSITIVE — AB

## 2016-03-03 LAB — URINALYSIS COMPLETE WITH MICROSCOPIC (ARMC ONLY)
BACTERIA UA: NONE SEEN
Bilirubin Urine: NEGATIVE
Glucose, UA: NEGATIVE mg/dL
HGB URINE DIPSTICK: NEGATIVE
Ketones, ur: NEGATIVE mg/dL
LEUKOCYTES UA: NEGATIVE
NITRITE: NEGATIVE
PH: 6 (ref 5.0–8.0)
PROTEIN: NEGATIVE mg/dL
RBC / HPF: NONE SEEN RBC/hpf (ref 0–5)
SQUAMOUS EPITHELIAL / LPF: NONE SEEN
Specific Gravity, Urine: 1.003 — ABNORMAL LOW (ref 1.005–1.030)

## 2016-03-03 LAB — COMPREHENSIVE METABOLIC PANEL
ALT: 22 U/L (ref 17–63)
AST: 26 U/L (ref 15–41)
Albumin: 4.7 g/dL (ref 3.5–5.0)
Alkaline Phosphatase: 80 U/L (ref 38–126)
Anion gap: 8 (ref 5–15)
BUN: 15 mg/dL (ref 6–20)
CHLORIDE: 106 mmol/L (ref 101–111)
CO2: 24 mmol/L (ref 22–32)
CREATININE: 1.2 mg/dL (ref 0.61–1.24)
Calcium: 9.2 mg/dL (ref 8.9–10.3)
GFR calc non Af Amer: >60 mL/min (ref >60)
Glucose, Bld: 164 mg/dL — ABNORMAL HIGH (ref 65–99)
POTASSIUM: 3.5 mmol/L (ref 3.5–5.1)
SODIUM: 138 mmol/L (ref 135–145)
Total Bilirubin: 0.6 mg/dL (ref 0.3–1.2)
Total Protein: 7.5 g/dL (ref 6.5–8.1)

## 2016-03-03 LAB — CBC WITH DIFFERENTIAL/PLATELET
Basophils Absolute: 0 10*3/uL (ref 0–0.1)
Basophils Relative: 0 %
Eosinophils Absolute: 0.1 10*3/uL (ref 0–0.7)
HEMATOCRIT: 40.7 % (ref 40.0–52.0)
HEMOGLOBIN: 13.7 g/dL (ref 13.0–18.0)
LYMPHS ABS: 1.2 10*3/uL (ref 1.0–3.6)
MCH: 32.9 pg (ref 26.0–34.0)
MCHC: 33.7 g/dL (ref 32.0–36.0)
MCV: 97.5 fL (ref 80.0–100.0)
Monocytes Absolute: 0.6 10*3/uL (ref 0.2–1.0)
Monocytes Relative: 8 %
NEUTROS ABS: 5.8 10*3/uL (ref 1.4–6.5)
Neutrophils Relative %: 76 %
Platelets: 155 10*3/uL (ref 150–440)
RBC: 4.18 MIL/uL — AB (ref 4.40–5.90)
RDW: 13.9 % (ref 11.5–14.5)
WBC: 7.6 10*3/uL (ref 3.8–10.6)

## 2016-03-03 LAB — ETHANOL: Alcohol, Ethyl (B): <5 mg/dL (ref <5)

## 2016-03-03 LAB — ACETAMINOPHEN LEVEL: Acetaminophen (Tylenol), Serum: <10 ug/mL — ABNORMAL LOW (ref 10–30)

## 2016-03-03 LAB — SALICYLATE LEVEL: Salicylate Lvl: <4 mg/dL (ref 2.8–30.0)

## 2016-03-03 NOTE — ED Notes (Signed)
Pt talkative in room with or without company, delusional but cheerful, denies discomforts.

## 2016-03-03 NOTE — ED Notes (Signed)
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  Pt is calm and cooperative att.  Pt was picked up hitchhiking at [place], pt was trying to get to [place].  Pt is from [address] [facility] group home, owner is Nance Pear.

## 2016-03-03 NOTE — Discharge Instructions (Signed)
Schizophrenia °Schizophrenia is a mental illness. It may cause disturbed or disorganized thinking, speech, or behavior. People with schizophrenia have problems functioning in one or more areas of life: work, school, home, or relationships. People with schizophrenia are at increased risk for suicide, certain chronic physical illnesses, and unhealthy behaviors, such as smoking and drug use. °People who have family members with schizophrenia are at higher risk of developing the illness. Schizophrenia affects men and women equally but usually appears at an earlier age (teenage or early adult years) in men.  °SYMPTOMS °The earliest symptoms are often subtle (prodrome) and may go unnoticed until the illness becomes more severe (first-break psychosis). Symptoms of schizophrenia may be continuous or may come and go in severity. Episodes often are triggered by major life events, such as family stress, college, military service, marriage, pregnancy or child birth, divorce, or loss of a loved one. People with schizophrenia may see, hear, or feel things that do not exist (hallucinations). They may have false beliefs in spite of obvious proof to the contrary (delusions). Sometimes speech is incoherent or behavior is odd or withdrawn.  °DIAGNOSIS °Schizophrenia is diagnosed through an assessment by your caregiver. Your caregiver will ask questions about your thoughts, behavior, mood, and ability to function in daily life. Your caregiver may ask questions about your medical history and use of alcohol or drugs, including prescription medication. Your caregiver may also order blood tests and imaging exams. Certain medical conditions and substances can cause symptoms that resemble schizophrenia. Your caregiver may refer you to a mental health specialist for evaluation. There are three major criterion for a diagnosis of schizophrenia: °· Two or more of the following five symptoms are present for a month or longer: °¨ Delusions. Often  the delusions are that you are being attacked, harassed, cheated, persecuted or conspired against (persecutory delusions). °¨ Hallucinations.   °¨ Disorganized speech that does not make sense to others. °¨ Grossly disorganized (confused or unfocused) behavior or extremely overactive or underactive motor activity (catatonia). °¨ Negative symptoms such as bland or blunted emotions (flat affect), loss of will power (avolition), and withdrawal from social contacts (social isolation). °· Level of functioning in one or more major areas of life (work, school, relationships, or self-care) is markedly below the level of functioning before the onset of illness.   °· There are continuous signs of illness (either mild symptoms or decreased level of functioning) for at least 6 months or longer. °TREATMENT  °Schizophrenia is a long-term illness. It is best controlled with continuous treatment rather than treatment only when symptoms occur. The following treatments are used to manage schizophrenia: °· Medication--Medication is the most effective and important form of treatment for schizophrenia. Antipsychotic medications are usually prescribed to help manage schizophrenia. Other types of medication may be added to relieve any symptoms that may occur despite the use of antipsychotic medications. °· Counseling or talk therapy--Individual, group, or family counseling may be helpful in providing education, support, and guidance. Many people with schizophrenia also benefit from social skills and job skills (vocational) training. °A combination of medication and counseling is best for managing the disorder over time. A procedure in which electricity is applied to the brain through the scalp (electroconvulsive therapy) may be used to treat catatonic schizophrenia or schizophrenia in people who cannot take or do not respond to medication and counseling. °  °This information is not intended to replace advice given to you by your health  care provider. Make sure you discuss any questions you have with   your health care provider. °  °Document Released: 09/19/2000 Document Revised: 10/13/2014 Document Reviewed: 12/15/2012 °Elsevier Interactive Patient Education ©2016 [employer] ° °

## 2016-03-03 NOTE — ED Provider Notes (Signed)
[facility] Emergency Department Provider Note   ____________________________________________  Time seen: Approximately 4:57 AM  I have reviewed the triage vital signs and the nursing notes.   HISTORY  Chief Complaint Agitation; Altered Mental Status; and Manic Behavior    HPI Joseph Holt. is a 65 y.o. male who presents to the ED from group home accompanied by [employer] under IVC for dementia and psychosis. Patient has a history of schizophrenia who reportedly was agitated this evening, presenting with pressured and tangential speech. Denies active SI/HI/AH/VH. Voices no medical complaints.Denies trauma or injury. He ran away from the group home and was found hitchhiking at the [place], trying to get to [place].   Past Medical History  Diagnosis Date  . Schizo affective schizophrenia ([place])   . Bipolar 1 disorder [facility])     Patient Active Problem List   Diagnosis Date Noted  . Chronic schizophrenia ([place]) 02/21/2015  . Social maladjustment 02/21/2015    Past Surgical History  Procedure Laterality Date  . Appendectomy    . Hernia repair      Current Outpatient Rx  Name  Route  Sig  Dispense  Refill  . cetirizine (ZYRTEC) 10 MG tablet   Oral   Take 10 mg by mouth daily.         Marland Kitchen donepezil (ARICEPT) 5 MG tablet   Oral   Take 5 mg by mouth every morning.         . fluticasone (VERAMYST) 27.5 MCG/SPRAY nasal spray   Nasal   Place 1 spray into the nose daily.         . Fluticasone-Salmeterol (ADVAIR) 250-50 MCG/DOSE AEPB   Inhalation   Inhale 1 puff into the lungs 2 (two) times daily.         Marland Kitchen gabapentin (NEURONTIN) 400 MG capsule   Oral   Take 400 mg by mouth every morning.         Marland Kitchen omega-3 acid ethyl esters (LOVAZA) 1 G capsule   Oral   Take 4 capsules by mouth 2 (two) times daily.         Marland Kitchen omeprazole (PRILOSEC) 20 MG capsule   Oral   Take 20 mg by mouth every morning.         Marland Kitchen QUEtiapine (SEROQUEL) 400  MG tablet   Oral   Take 400 mg by mouth at bedtime.         . simvastatin (ZOCOR) 20 MG tablet   Oral   Take 20 mg by mouth at bedtime.         . tamsulosin (FLOMAX) 0.4 MG CAPS capsule   Oral   Take 0.4 mg by mouth at bedtime.         . Vitamin D, Ergocalciferol, (DRISDOL) 50000 UNITS CAPS capsule   Oral   Take 50,000 Units by mouth every 7 (seven) days.           Allergies Review of patient's allergies indicates no known allergies.  History reviewed. No pertinent family history.  Social History Social History  Substance Use Topics  . Smoking status: Current Every Day Smoker -- 0.20 packs/day    Types: Cigarettes  . Smokeless tobacco: None  . Alcohol Use: No    Review of Systems  Constitutional: No fever/chills. Eyes: No visual changes. ENT: No sore throat. Cardiovascular: Denies chest pain. Respiratory: Denies shortness of breath. Gastrointestinal: No abdominal pain.  No nausea, no vomiting.  No diarrhea.  No  constipation. Genitourinary: Negative for dysuria. Musculoskeletal: Negative for back pain. Skin: Negative for rash. Neurological: Negative for headaches, focal weakness or numbness. Psychiatric:Positive for agitation.  10-point ROS otherwise negative.  ____________________________________________   PHYSICAL EXAM:  VITAL SIGNS: ED Triage Vitals  Enc Vitals Group     BP 03/03/16 0317 121/75 mmHg     Pulse Rate 03/03/16 0317 106     Resp 03/03/16 0317 18     Temp 03/03/16 0317 97.8 F (36.6 C)     Temp Source 03/03/16 0317 Oral     SpO2 03/03/16 0317 96 %     Weight 03/03/16 0317 155 lb (70.308 kg)     Height 03/03/16 0317 6' 3.25" (1.911 m)     Head Cir --      Peak Flow --      Pain Score 03/03/16 0407 0     Pain Loc --      Pain Edu? --      Excl. in [place]? --     Constitutional: Alert and oriented. Well appearing and in no acute distress. Eyes: Conjunctivae are normal. PERRL. EOMI. Head: Atraumatic. Nose: No  congestion/rhinnorhea. Mouth/Throat: Mucous membranes are moist.  Oropharynx non-erythematous. Neck: No stridor.  No carotid bruits. Cardiovascular: Normal rate, regular rhythm. Grossly normal heart sounds.  Good peripheral circulation. Respiratory: Normal respiratory effort.  No retractions. Lungs CTAB. Gastrointestinal: Soft and nontender. No distention. No abdominal bruits. No CVA tenderness. Musculoskeletal: No lower extremity tenderness nor edema.  No joint effusions. Neurologic:  Normal speech and language. No gross focal neurologic deficits are appreciated. No gait instability. Skin:  Skin is warm, dry and intact. No rash noted. Psychiatric: Mood and affect are normal. Speech is pressured and tangential with racial undertones.  ____________________________________________   LABS (all labs ordered are listed, but only abnormal results are displayed)  Labs Reviewed  COMPREHENSIVE METABOLIC PANEL - Abnormal; Notable for the following:    Glucose, Bld 164 (*)    All other components within normal limits  CBC WITH DIFFERENTIAL/PLATELET - Abnormal; Notable for the following:    RBC 4.18 (*)    All other components within normal limits  ACETAMINOPHEN LEVEL - Abnormal; Notable for the following:    Acetaminophen (Tylenol), Serum <10 (*)    All other components within normal limits  ETHANOL  SALICYLATE LEVEL  URINE DRUG SCREEN, QUALITATIVE ([facility] ONLY)  URINALYSIS COMPLETEWITH MICROSCOPIC ([facility] ONLY)   ____________________________________________  EKG  ED ECG REPORT I, Lauralee Waters J, the attending physician, personally viewed and interpreted this ECG.   Date: 03/03/2016  EKG Time: 0557  Rate: 79  Rhythm: normal EKG, normal sinus rhythm  Axis: Normal  Intervals:left bundle branch block  ST&T Change: Nonspecific  ____________________________________________  RADIOLOGY  None ____________________________________________   PROCEDURES  Procedure(s) performed:  None  Critical Care performed: No  ____________________________________________   INITIAL IMPRESSION / ASSESSMENT AND PLAN / ED COURSE  Pertinent labs & imaging results that were available during my care of the patient were reviewed by me and considered in my medical decision making (see chart for details).  65 year old male with a history of schizophrenia who presents to the ED under IVC for agitation at the group home. Will maintain IVC pending TTS and psychiatry consults. ____________________________________________   FINAL CLINICAL IMPRESSION(S) / ED DIAGNOSES  Final diagnoses:  Dementia, without behavioral disturbance  Chronic schizophrenia ([place])      NEW MEDICATIONS STARTED DURING THIS VISIT:  New Prescriptions   No medications on file  Note:  This document was prepared using Dragon voice recognition software and may include unintentional dictation errors.    Paulette Blanch, MD 03/03/16 [phone]

## 2016-03-03 NOTE — ED Notes (Signed)
Pt. Brothers name is Michaeljames Milnes [phone]

## 2016-03-03 NOTE — ED Notes (Signed)
BEHAVIORAL HEALTH ROUNDING Patient sleeping: No. Patient alert and oriented: no Behavior appropriate: Yes.  ; If no, describe: Nutrition and fluids offered: yes Toileting and hygiene offered: yes Sitter present:na Law enforcement present: yes

## 2016-03-03 NOTE — BH Assessment (Signed)
Assessment Note  Joseph Holt. is an 65 y.o. male. Joseph Holt reports that he had to leave, due to his medication is making him less balanced.  He states that he would like to find somewhere else to stay.  He states that he is not to sit on the front porch and that there is a concern that the community is racist, and he does not know if he is safe.  He denied symptoms of depression.  He denied having auditory or visual hallucinations. He denied suicidal ideation or intent. He denied homicidal ideation or intent. He states that he just had to get away.  He was hoping to hitchhike, but knows that it was dangerous. Joseph Holt appeared very tangential and went through several topics in short amount of time.  He reports multiple conspiracy theories. He is currently residing at [facility].  Nance Pear [phone]) group home owner of [facility] ([address]. [place], [place] [address]). Joseph Holt was previously at [facility]  On May 17th for similar reasons.  Diagnosis: Schizophrenia  Past Medical History:  Past Medical History  Diagnosis Date  . Schizo affective schizophrenia ([place])   . Bipolar 1 disorder [facility])     Past Surgical History  Procedure Laterality Date  . Appendectomy    . Hernia repair      Family History: History reviewed. No pertinent family history.  Social History:  reports that he has been smoking Cigarettes.  He has been smoking about 0.20 packs per day. He does not have any smokeless tobacco history on file. He reports that he does not drink alcohol or use illicit drugs.  Additional Social History:  Alcohol / Drug Use History of alcohol / drug use?: No history of alcohol / drug abuse  CIWA: CIWA-Ar BP: 121/75 mmHg Pulse Rate: (!) 106 COWS:    Allergies: No Known Allergies  Home Medications:  (Not in a hospital admission)  OB/GYN Status:  No LMP for male patient.  General Assessment Data Location of Assessment: [facility] ED TTS Assessment: In system Is  this a Tele or Face-to-Face Assessment?: Face-to-Face Is this an Initial Assessment or a Re-assessment for this encounter?: Initial Assessment Marital status: Single Maiden name: n/a Is patient pregnant?: No Pregnancy Status: No Living Arrangements: Other (Comment) ([facility]) Can pt return to current living arrangement?: Yes Admission Status: Involuntary Is patient capable of signing voluntary admission?: Yes Referral Source: Self/Family/Friend Insurance type: Medicare  Medical Screening Exam ([place]) Medical Exam completed: Yes  Crisis Care Plan Living Arrangements: Other (Comment) ([facility]) Legal Guardian: Other: (Self) Name of Psychiatrist: None Name of Therapist: None  Education Status Is patient currently in school?: No Current Grade: n/a Highest grade of school patient has completed: Master's Degree Name of school: [employer]) Contact person: n/a  Risk to self with the past 6 months Suicidal Ideation: No Has patient been a risk to self within the past 6 months prior to admission? : No Suicidal Intent: No Has patient had any suicidal intent within the past 6 months prior to admission? : No Is patient at risk for suicide?: No Suicidal Plan?: No Has patient had any suicidal plan within the past 6 months prior to admission? : No Access to Means: No What has been your use of drugs/alcohol within the last 12 months?: denied Previous Attempts/Gestures: No How many times?: 0 Other Self Harm Risks: denied Triggers for Past Attempts: None known Intentional Self Injurious Behavior: None  Family Suicide History: No Recent stressful life event(s): Other (Comment) (discontent with residence) Persecutory voices/beliefs?: No Depression: No Depression Symptoms:  (Denied) Substance abuse history and/or treatment for substance abuse?: No Suicide prevention information given to non-admitted patients: Not applicable  Risk to Others within the past 6  months Homicidal Ideation: No Does patient have any lifetime risk of violence toward others beyond the six months prior to admission? : No Thoughts of Harm to Others: No Current Homicidal Intent: No Current Homicidal Plan: No Access to Homicidal Means: No Identified Victim: None identified History of harm to others?: No Assessment of Violence: None Noted Violent Behavior Description: none identified Does patient have access to weapons?: No Criminal Charges Pending?: No Does patient have a court date: No Is patient on probation?: No  Psychosis Hallucinations: None noted Delusions: None noted  Mental Status Report Appearance/Hygiene: In scrubs Eye Contact: Good Motor Activity: Restlessness Speech: Tangential Level of Consciousness: Alert Mood: Pleasant Affect: Appropriate to circumstance Anxiety Level: None Thought Processes: Tangential Judgement: Unimpaired Orientation: Person, Place, Situation Obsessive Compulsive Thoughts/Behaviors: None  Cognitive Functioning Concentration: Normal Memory: Recent Intact IQ: Average Insight: Fair Impulse Control: Fair Appetite: Fair Sleep: No Change Vegetative Symptoms: None  ADLScreening [facility] Assessment Services) Patient's cognitive ability adequate to safely complete daily activities?: Yes Patient able to express need for assistance with ADLs?: Yes Independently performs ADLs?: Yes (appropriate for developmental age)  Prior Inpatient Therapy Prior Inpatient Therapy: No Prior Therapy Dates: n/a Prior Therapy Facilty/Provider(s): n/a Reason for Treatment: n/a  Prior Outpatient Therapy Prior Outpatient Therapy: No Prior Therapy Dates: n/a Prior Therapy Facilty/Provider(s): n/a Reason for Treatment: n/a Does patient have an ACCT team?: No Does patient have Intensive In-House Services?  : No Does patient have [facility] services? : No Does patient have P4CC services?: No  ADL Screening (condition at time of  admission) Patient's cognitive ability adequate to safely complete daily activities?: Yes Patient able to express need for assistance with ADLs?: Yes Independently performs ADLs?: Yes (appropriate for developmental age)       Abuse/Neglect Assessment (Assessment to be complete while patient is alone) Physical Abuse: Denies Verbal Abuse: Denies Sexual Abuse: Denies Exploitation of patient/patient's resources: Denies Self-Neglect: Denies     Regulatory affairs officer (For Healthcare) Does patient have an advance directive?: No Would patient like information on creating an advanced directive?: No - patient declined information    Additional Information 1:1 In Past 12 Months?: No CIRT Risk: No Elopement Risk: No Does patient have medical clearance?: Yes     Disposition:  Disposition Initial Assessment Completed for this Encounter: Yes Disposition of Patient: Other dispositions  On Site Evaluation by:   Reviewed with Physician:    Elmer Bales 03/03/2016 6:30 AM

## 2016-03-03 NOTE — ED Notes (Signed)
Joseph Holt TTS attempting to call home.

## 2016-03-03 NOTE — ED Notes (Signed)
Patient up to restroom. Ambulates without difficulty. Food tray and warm blanket given to patient. NAD noted at this time.

## 2016-03-03 NOTE — ED Provider Notes (Signed)
-----------------------------------------   2:54 PM on 03/03/2016 -----------------------------------------   Blood pressure 108/81, pulse 89, temperature 97.8 F (36.6 C), temperature source Oral, resp. rate 18, height 6' 3.25" (1.911 m), weight 155 lb (70.308 kg), SpO2 97 %.  Assuming care from Dr. Dolphus Jenny.  In short, Joseph Holt. is a 65 y.o. male with a chief complaint of Agitation; Altered Mental Status; and Manic Behavior .  Refer to the original H&P for additional details.  The current plan of care is to be seen by psychiatry is currently cleared the patient at this point. IVC paperwork has been rescinded. Patient's otherwise hemodynamically stable and discharge paperwork was arranged  Daymon Larsen, MD 03/03/16 1455

## 2016-03-03 NOTE — ED Notes (Signed)
BEHAVIORAL HEALTH ROUNDING Patient sleeping: Yes.   Patient alert and oriented: no Behavior appropriate: Yes.  ; If no, describe:  Nutrition and fluids offered: Yes  Toileting and hygiene offered: Yes  Sitter present: not applicable Law enforcement present: Yes

## 2016-03-03 NOTE — ED Notes (Signed)

## 2016-03-03 NOTE — Consult Note (Signed)
[facility] Psychiatry Consult   Reason for Consult:  Agitation Referring Physician:  EDP Patient Identification: Joseph Holt. MRN:  [id number] Principal Diagnosis: Schizophrenia Diagnosis:   Patient Active Problem List   Diagnosis Date Noted  . Chronic schizophrenia ([place]) [F20.9] 02/21/2015  . Social maladjustment [Z60.9] 02/21/2015    Total Time spent with patient: 1 hour  Subjective:   Joseph Carriere. is a 65 y.o. male patient presented to the emergency room as he was having conflict with his group home. He was recently discharged from the ER.  HPI:   Patient is a 65 year old male who currently lives in a group home. He presented to the ER as he reported that he wants another place to stay. He reported that they do not feel him" and he does not like that place. He reported that he has been living there off and on for the past 10 years. He reported that they refuse to give him enough food and they keep him behind. He stated that they seem to be concerned about other aspects of the likelihood. He reported that he is [place] and they are [employer]. He reported that he does not like to be in the [place]. Patient became progressively agitated during the interview and he stated that he is not willing to return back to the same place again. He currently denied having any suicidal ideations or plans  Social history: Patient resides in a group home. The more he talked about it I was unclear whether he has been in the current one 10 years or if he had recently relocated. In either case he has a safe place to stay. Has close psychiatric follow-up in the community. He has several brothers who are his closest family but they all live in [place]  Medical history: Patient denies any significant medical problems. Denies heart disease high blood pressure diabetes  Substance abuse history: Does not drink alcohol regularly. Doesn't abuse any drugs. Denies any past alcohol or drug use  Past  Psychiatric History: Long-standing psychiatric disorder probably essentially lifelong. Several prior hospitalizations but hasn't been in a psychiatric hospital in years. Denies any history of suicide attempts    Risk to Self: Suicidal Ideation: No Suicidal Intent: No Is patient at risk for suicide?: No Suicidal Plan?: No Access to Means: No What has been your use of drugs/alcohol within the last 12 months?: denied How many times?: 0 Other Self Harm Risks: denied Triggers for Past Attempts: None known Intentional Self Injurious Behavior: None Risk to Others: Homicidal Ideation: No Thoughts of Harm to Others: No Current Homicidal Intent: No Current Homicidal Plan: No Access to Homicidal Means: No Identified Victim: None identified History of harm to others?: No Assessment of Violence: None Noted Violent Behavior Description: none identified Does patient have access to weapons?: No Criminal Charges Pending?: No Does patient have a court date: No Prior Inpatient Therapy: Prior Inpatient Therapy: No Prior Therapy Dates: n/a Prior Therapy Facilty/Provider(s): n/a Reason for Treatment: n/a Prior Outpatient Therapy: Prior Outpatient Therapy: No Prior Therapy Dates: n/a Prior Therapy Facilty/Provider(s): n/a Reason for Treatment: n/a Does patient have an ACCT team?: No Does patient have Intensive In-House Services?  : No Does patient have [facility] services? : No Does patient have P4CC services?: No  Past Medical History:  Past Medical History  Diagnosis Date  . Schizo affective schizophrenia ([place])   . Bipolar 1 disorder [facility])     Past Surgical History  Procedure Laterality Date  .  Appendectomy    . Hernia repair     Family History: History reviewed. No pertinent family history.  Social History:  History  Alcohol Use No     History  Drug Use No    Social History   Social History  . Marital Status: Single    Spouse Name: N/A  . Number of Children: N/A  . Years of  Education: N/A   Social History Main Topics  . Smoking status: Current Every Day Smoker -- 0.20 packs/day    Types: Cigarettes  . Smokeless tobacco: None  . Alcohol Use: No  . Drug Use: No  . Sexual Activity: Not Asked   Other Topics Concern  . None   Social History Narrative   Additional Social History:    Allergies:  No Known Allergies  Labs:  Results for orders placed or performed during the hospital encounter of 03/03/16 (from the past 48 hour(s))  Comprehensive metabolic panel     Status: Abnormal   Collection Time: 03/03/16  4:16 AM  Result Value Ref Range   Sodium 138 135 - 145 mmol/L   Potassium 3.5 3.5 - 5.1 mmol/L   Chloride 106 101 - 111 mmol/L   CO2 24 22 - 32 mmol/L   Glucose, Bld 164 (H) 65 - 99 mg/dL   BUN 15 6 - 20 mg/dL   Creatinine, Ser 1.20 0.61 - 1.24 mg/dL   Calcium 9.2 8.9 - 10.3 mg/dL   Total Protein 7.5 6.5 - 8.1 g/dL   Albumin 4.7 3.5 - 5.0 g/dL   AST 26 15 - 41 U/L   ALT 22 17 - 63 U/L   Alkaline Phosphatase 80 38 - 126 U/L   Total Bilirubin 0.6 0.3 - 1.2 mg/dL   GFR calc non Af Amer >60 >60 mL/min   GFR calc Af Amer >60 >60 mL/min    Comment: (NOTE) The eGFR has been calculated using the CKD EPI equation. This calculation has not been validated in all clinical situations. eGFR's persistently <60 mL/min signify possible Chronic Kidney Disease.    Anion gap 8 5 - 15  Ethanol     Status: None   Collection Time: 03/03/16  4:16 AM  Result Value Ref Range   Alcohol, Ethyl (B) <5 <5 mg/dL    Comment:        LOWEST DETECTABLE LIMIT FOR SERUM ALCOHOL IS 5 mg/dL FOR MEDICAL PURPOSES ONLY   CBC with Diff     Status: Abnormal   Collection Time: 03/03/16  4:16 AM  Result Value Ref Range   WBC 7.6 3.8 - 10.6 K/uL   RBC 4.18 (L) 4.40 - 5.90 MIL/uL   Hemoglobin 13.7 13.0 - 18.0 g/dL   HCT 40.7 40.0 - 52.0 %   MCV 97.5 80.0 - 100.0 fL   MCH 32.9 26.0 - 34.0 pg   MCHC 33.7 32.0 - 36.0 g/dL   RDW 13.9 11.5 - 14.5 %   Platelets 155 150 - 440  K/uL   Neutrophils Relative % 76% %   Neutro Abs 5.8 1.4 - 6.5 K/uL   Lymphocytes Relative 15% %   Lymphs Abs 1.2 1.0 - 3.6 K/uL   Monocytes Relative 8% %   Monocytes Absolute 0.6 0.2 - 1.0 K/uL   Eosinophils Relative 1% %   Eosinophils Absolute 0.1 0 - 0.7 K/uL   Basophils Relative 0% %   Basophils Absolute 0.0 0 - 0.1 K/uL  Salicylate level     Status: None  Collection Time: 03/03/16  4:16 AM  Result Value Ref Range   Salicylate Lvl <2.1 2.8 - 30.0 mg/dL  Acetaminophen level     Status: Abnormal   Collection Time: 03/03/16  4:16 AM  Result Value Ref Range   Acetaminophen (Tylenol), Serum <10 (L) 10 - 30 ug/mL    Comment:        THERAPEUTIC CONCENTRATIONS VARY SIGNIFICANTLY. A RANGE OF 10-30 ug/mL MAY BE AN EFFECTIVE CONCENTRATION FOR MANY PATIENTS. HOWEVER, SOME ARE BEST TREATED AT CONCENTRATIONS OUTSIDE THIS RANGE. ACETAMINOPHEN CONCENTRATIONS >150 ug/mL AT 4 HOURS AFTER INGESTION AND >50 ug/mL AT 12 HOURS AFTER INGESTION ARE OFTEN ASSOCIATED WITH TOXIC REACTIONS.   Urine Drug Screen, Qualitative ([facility] only)     Status: Abnormal   Collection Time: 03/03/16  9:05 AM  Result Value Ref Range   Tricyclic, Ur Screen POSITIVE (A) NONE DETECTED   Amphetamines, Ur Screen NONE DETECTED NONE DETECTED   MDMA (Ecstasy)Ur Screen NONE DETECTED NONE DETECTED   Cocaine Metabolite,Ur [place] NONE DETECTED NONE DETECTED   Opiate, Ur Screen NONE DETECTED NONE DETECTED   Phencyclidine (PCP) Ur S NONE DETECTED NONE DETECTED   Cannabinoid 50 Ng, Ur [place] NONE DETECTED NONE DETECTED   Barbiturates, Ur Screen NONE DETECTED NONE DETECTED   Benzodiazepine, Ur Scrn POSITIVE (A) NONE DETECTED   Methadone Scn, Ur NONE DETECTED NONE DETECTED    Comment: (NOTE) 308  Tricyclics, urine               Cutoff 1000 ng/mL 200  Amphetamines, urine             Cutoff 1000 ng/mL 300  MDMA (Ecstasy), urine           Cutoff 500 ng/mL 400  Cocaine Metabolite, urine       Cutoff 300 ng/mL 500  Opiate, urine                    Cutoff 300 ng/mL 600  Phencyclidine (PCP), urine      Cutoff 25 ng/mL 700  Cannabinoid, urine              Cutoff 50 ng/mL 800  Barbiturates, urine             Cutoff 200 ng/mL 900  Benzodiazepine, urine           Cutoff 200 ng/mL 1000 Methadone, urine                Cutoff 300 ng/mL 1100 1200 The urine drug screen provides only a preliminary, unconfirmed 1300 analytical test result and should not be used for non-medical 1400 purposes. Clinical consideration and professional judgment should 1500 be applied to any positive drug screen result due to possible 1600 interfering substances. A more specific alternate chemical method 1700 must be used in order to obtain a confirmed analytical result.  1800 Gas chromato graphy / mass spectrometry (GC/MS) is the preferred 1900 confirmatory method.   Urinalysis complete, with microscopic ([facility] only)     Status: Abnormal   Collection Time: 03/03/16  9:05 AM  Result Value Ref Range   Color, Urine STRAW (A) YELLOW   APPearance CLEAR (A) CLEAR   Glucose, UA NEGATIVE NEGATIVE mg/dL   Bilirubin Urine NEGATIVE NEGATIVE   Ketones, ur NEGATIVE NEGATIVE mg/dL   Specific Gravity, Urine 1.003 (L) 1.005 - 1.030   Hgb urine dipstick NEGATIVE NEGATIVE   pH 6.0 5.0 - 8.0   Protein, ur NEGATIVE NEGATIVE mg/dL   Nitrite NEGATIVE  NEGATIVE   Leukocytes, UA NEGATIVE NEGATIVE   RBC / HPF NONE SEEN 0 - 5 RBC/hpf   WBC, UA 0-5 0 - 5 WBC/hpf   Bacteria, UA NONE SEEN NONE SEEN   Squamous Epithelial / LPF NONE SEEN NONE SEEN    No current facility-administered medications for this encounter.   Current Outpatient Prescriptions  Medication Sig Dispense Refill  . cetirizine (ZYRTEC) 10 MG tablet Take 10 mg by mouth daily.    Marland Kitchen donepezil (ARICEPT) 5 MG tablet Take 5 mg by mouth every morning.    . fluticasone (VERAMYST) 27.5 MCG/SPRAY nasal spray Place 1 spray into the nose daily.    . Fluticasone-Salmeterol (ADVAIR) 250-50 MCG/DOSE AEPB Inhale 1 puff  into the lungs 2 (two) times daily.    Marland Kitchen gabapentin (NEURONTIN) 400 MG capsule Take 400 mg by mouth every morning.    Marland Kitchen omega-3 acid ethyl esters (LOVAZA) 1 G capsule Take 4 capsules by mouth 2 (two) times daily.    Marland Kitchen omeprazole (PRILOSEC) 20 MG capsule Take 20 mg by mouth every morning.    Marland Kitchen QUEtiapine (SEROQUEL) 400 MG tablet Take 400 mg by mouth at bedtime.    . simvastatin (ZOCOR) 20 MG tablet Take 20 mg by mouth at bedtime.    . tamsulosin (FLOMAX) 0.4 MG CAPS capsule Take 0.4 mg by mouth at bedtime.    . Vitamin D, Ergocalciferol, (DRISDOL) 50000 UNITS CAPS capsule Take 50,000 Units by mouth every 7 (seven) days.      Musculoskeletal: Strength & Muscle Tone: within normal limits Gait & Station: normal Patient leans: N/A  Psychiatric Specialty Exam: Physical Exam  ROS  Blood pressure 121/75, pulse 106, temperature 97.8 F (36.6 C), temperature source Oral, resp. rate 18, height 6' 3.25" (1.911 m), weight 155 lb (70.308 kg), SpO2 96 %.Body mass index is 19.25 kg/(m^2).  General Appearance: Fairly Groomed  Eye Contact:  Fair  Speech:  Pressured  Volume:  Increased  Mood:  Anxious  Affect:  Congruent  Thought Process:  Linear  Orientation:  Full (Time, Place, and Person)  Thought Content:  WDL  Suicidal Thoughts:  No  Homicidal Thoughts:  No  Memory:  Immediate;   Fair  Judgement:  Intact  Insight:  Lacking  Psychomotor Activity:  Decreased  Concentration:  Concentration: Fair and Attention Span: Fair  Recall:  [employer] of Knowledge:  Fair  Language:  Fair  Akathisia:  No  Handed:  Right  AIMS (if indicated):     Assets:  Armed forces logistics/support/administrative officer Physical Health Transportation  ADL's:  Intact  Cognition:  WNL  Sleep:        Treatment Plan Summary: Medication management  Disposition: Patient does not meet criteria for psychiatric inpatient admission. Discussed crisis plan, support from social network, calling 911, coming to the Emergency Department, and calling  Suicide Hotline.   Patient does not meet the criteria for involuntary commitment at this time. He will be discharged back to his group home. Case discussed with the behavioral health and they will transfer him back. He does not have any suicidal homicidal ideations or plans at this time.  Rainey Pines, MD 03/03/2016 1:47 PM

## 2016-03-03 NOTE — ED Notes (Signed)
Joseph Holt TTS called to let know that group home had been contacted and would be picking up pt in approx 1 hour.

## 2016-11-26 DIAGNOSIS — F209 Schizophrenia, unspecified: Secondary | ICD-10-CM | POA: Diagnosis not present

## 2017-01-13 DIAGNOSIS — N400 Enlarged prostate without lower urinary tract symptoms: Secondary | ICD-10-CM | POA: Diagnosis not present

## 2017-01-13 DIAGNOSIS — J449 Chronic obstructive pulmonary disease, unspecified: Secondary | ICD-10-CM | POA: Diagnosis not present

## 2017-01-13 DIAGNOSIS — R5383 Other fatigue: Secondary | ICD-10-CM | POA: Diagnosis not present

## 2017-01-13 DIAGNOSIS — I340 Nonrheumatic mitral (valve) insufficiency: Secondary | ICD-10-CM | POA: Diagnosis not present

## 2017-01-13 DIAGNOSIS — F17210 Nicotine dependence, cigarettes, uncomplicated: Secondary | ICD-10-CM | POA: Diagnosis not present

## 2017-01-13 DIAGNOSIS — F17200 Nicotine dependence, unspecified, uncomplicated: Secondary | ICD-10-CM | POA: Diagnosis not present

## 2017-01-13 DIAGNOSIS — Z716 Tobacco abuse counseling: Secondary | ICD-10-CM | POA: Diagnosis not present

## 2017-01-13 DIAGNOSIS — E559 Vitamin D deficiency, unspecified: Secondary | ICD-10-CM | POA: Diagnosis not present

## 2017-01-13 DIAGNOSIS — E784 Other hyperlipidemia: Secondary | ICD-10-CM | POA: Diagnosis not present

## 2017-03-20 DIAGNOSIS — E559 Vitamin D deficiency, unspecified: Secondary | ICD-10-CM | POA: Diagnosis not present

## 2017-03-20 DIAGNOSIS — R197 Diarrhea, unspecified: Secondary | ICD-10-CM | POA: Diagnosis not present

## 2017-03-20 DIAGNOSIS — F17200 Nicotine dependence, unspecified, uncomplicated: Secondary | ICD-10-CM | POA: Diagnosis not present

## 2017-03-20 DIAGNOSIS — J449 Chronic obstructive pulmonary disease, unspecified: Secondary | ICD-10-CM | POA: Diagnosis not present

## 2017-03-20 DIAGNOSIS — E7800 Pure hypercholesterolemia, unspecified: Secondary | ICD-10-CM | POA: Diagnosis not present

## 2017-03-20 DIAGNOSIS — N400 Enlarged prostate without lower urinary tract symptoms: Secondary | ICD-10-CM | POA: Diagnosis not present

## 2017-03-20 DIAGNOSIS — H6123 Impacted cerumen, bilateral: Secondary | ICD-10-CM | POA: Diagnosis not present

## 2017-03-20 DIAGNOSIS — R5383 Other fatigue: Secondary | ICD-10-CM | POA: Diagnosis not present

## 2017-04-10 DIAGNOSIS — F209 Schizophrenia, unspecified: Secondary | ICD-10-CM | POA: Diagnosis not present

## 2017-04-13 DIAGNOSIS — F17219 Nicotine dependence, cigarettes, with unspecified nicotine-induced disorders: Secondary | ICD-10-CM | POA: Diagnosis not present

## 2017-04-13 DIAGNOSIS — R911 Solitary pulmonary nodule: Secondary | ICD-10-CM | POA: Diagnosis not present

## 2017-04-13 DIAGNOSIS — R918 Other nonspecific abnormal finding of lung field: Secondary | ICD-10-CM | POA: Diagnosis not present

## 2017-04-13 DIAGNOSIS — Z87891 Personal history of nicotine dependence: Secondary | ICD-10-CM | POA: Diagnosis not present

## 2017-04-14 DIAGNOSIS — D519 Vitamin B12 deficiency anemia, unspecified: Secondary | ICD-10-CM | POA: Diagnosis not present

## 2017-04-14 DIAGNOSIS — D649 Anemia, unspecified: Secondary | ICD-10-CM | POA: Diagnosis not present

## 2017-04-21 DIAGNOSIS — J449 Chronic obstructive pulmonary disease, unspecified: Secondary | ICD-10-CM | POA: Diagnosis not present

## 2017-04-21 DIAGNOSIS — R911 Solitary pulmonary nodule: Secondary | ICD-10-CM | POA: Diagnosis not present

## 2017-04-21 DIAGNOSIS — R5383 Other fatigue: Secondary | ICD-10-CM | POA: Diagnosis not present

## 2017-04-21 DIAGNOSIS — R634 Abnormal weight loss: Secondary | ICD-10-CM | POA: Diagnosis not present

## 2017-04-23 DIAGNOSIS — F209 Schizophrenia, unspecified: Secondary | ICD-10-CM | POA: Diagnosis not present

## 2017-05-01 ENCOUNTER — Telehealth: Payer: Self-pay

## 2017-05-01 NOTE — Telephone Encounter (Signed)
Attempted to schedule new patient appt per paper referral from [facility] .  No ans no vm 04/23/17 [facility]    No ans no vm 05/01/17 [facility]

## 2017-07-16 DIAGNOSIS — F419 Anxiety disorder, unspecified: Secondary | ICD-10-CM | POA: Diagnosis not present

## 2017-07-16 DIAGNOSIS — E559 Vitamin D deficiency, unspecified: Secondary | ICD-10-CM | POA: Diagnosis not present

## 2017-07-16 DIAGNOSIS — D649 Anemia, unspecified: Secondary | ICD-10-CM | POA: Diagnosis not present

## 2017-07-16 DIAGNOSIS — F329 Major depressive disorder, single episode, unspecified: Secondary | ICD-10-CM | POA: Diagnosis not present

## 2017-07-16 DIAGNOSIS — J449 Chronic obstructive pulmonary disease, unspecified: Secondary | ICD-10-CM | POA: Diagnosis not present

## 2017-07-16 DIAGNOSIS — R5383 Other fatigue: Secondary | ICD-10-CM | POA: Diagnosis not present

## 2017-07-16 DIAGNOSIS — E7800 Pure hypercholesterolemia, unspecified: Secondary | ICD-10-CM | POA: Diagnosis not present

## 2017-09-16 DIAGNOSIS — F209 Schizophrenia, unspecified: Secondary | ICD-10-CM | POA: Diagnosis not present

## 2017-10-22 DIAGNOSIS — F319 Bipolar disorder, unspecified: Secondary | ICD-10-CM | POA: Diagnosis not present

## 2017-10-22 DIAGNOSIS — R5383 Other fatigue: Secondary | ICD-10-CM | POA: Diagnosis not present

## 2017-10-22 DIAGNOSIS — E559 Vitamin D deficiency, unspecified: Secondary | ICD-10-CM | POA: Diagnosis not present

## 2017-10-22 DIAGNOSIS — Z716 Tobacco abuse counseling: Secondary | ICD-10-CM | POA: Diagnosis not present

## 2017-10-22 DIAGNOSIS — F419 Anxiety disorder, unspecified: Secondary | ICD-10-CM | POA: Diagnosis not present

## 2017-10-22 DIAGNOSIS — E7800 Pure hypercholesterolemia, unspecified: Secondary | ICD-10-CM | POA: Diagnosis not present

## 2017-10-22 DIAGNOSIS — Z1389 Encounter for screening for other disorder: Secondary | ICD-10-CM | POA: Diagnosis not present

## 2017-10-22 DIAGNOSIS — D649 Anemia, unspecified: Secondary | ICD-10-CM | POA: Diagnosis not present

## 2017-11-27 ENCOUNTER — Ambulatory Visit (INDEPENDENT_AMBULATORY_CARE_PROVIDER_SITE_OTHER): Payer: Medicare Other | Admitting: Physician Assistant

## 2017-11-27 ENCOUNTER — Encounter: Payer: Self-pay | Admitting: Physician Assistant

## 2017-11-27 VITALS — BP 100/80 | HR 78 | Temp 97.6°F | Resp 16 | Wt 152.0 lb

## 2017-11-27 DIAGNOSIS — J418 Mixed simple and mucopurulent chronic bronchitis: Secondary | ICD-10-CM | POA: Diagnosis not present

## 2017-11-27 DIAGNOSIS — G629 Polyneuropathy, unspecified: Secondary | ICD-10-CM | POA: Diagnosis not present

## 2017-11-27 DIAGNOSIS — F209 Schizophrenia, unspecified: Secondary | ICD-10-CM

## 2017-11-27 DIAGNOSIS — E7800 Pure hypercholesterolemia, unspecified: Secondary | ICD-10-CM

## 2017-11-27 DIAGNOSIS — F0281 Dementia in other diseases classified elsewhere with behavioral disturbance: Secondary | ICD-10-CM

## 2017-11-27 DIAGNOSIS — J302 Other seasonal allergic rhinitis: Secondary | ICD-10-CM

## 2017-11-27 DIAGNOSIS — Z7689 Persons encountering health services in other specified circumstances: Secondary | ICD-10-CM | POA: Diagnosis not present

## 2017-11-27 DIAGNOSIS — R351 Nocturia: Secondary | ICD-10-CM

## 2017-11-27 DIAGNOSIS — F02818 Dementia in other diseases classified elsewhere, unspecified severity, with other behavioral disturbance: Secondary | ICD-10-CM

## 2017-11-27 DIAGNOSIS — E441 Mild protein-calorie malnutrition: Secondary | ICD-10-CM | POA: Diagnosis not present

## 2017-11-27 MED ORDER — LORAZEPAM 1 MG PO TABS
1.0000 mg | ORAL_TABLET | Freq: Two times a day (BID) | ORAL | 1 refills | Status: DC
Start: 2017-11-27 — End: 2018-01-13

## 2017-11-27 NOTE — Progress Notes (Signed)
Patient: Joseph Holt. Male    DOB: 19-Feb-1951   67 y.o.   MRN: [id number] Visit Date: 11/27/2017  Today's Provider: Mar Daring, PA-C   Chief Complaint  Patient presents with  . New Patient (Initial Visit)   Subjective:    HPI Patient here to establish care transferring from [facility], due to insurance. Patient here today with Nance Pear (care giver). Patient reports feeling well denies any symptoms.  Care giver reports that patient is in need of refill for Ativan 1 mg tablet BID.   Mr. Eisner is a 67 yr old male with PMHx of schizophrenia, dementia, COPD, hypercholesterolemia, neuropathy and seasonal allergies presenting to establish care. He is currently residing at [facility]. He is stable on all current medications. He needs to re-establish with new provider as is previous provider was unable to right for some of his medications and then that office stopped taking medicare insurance as well. He has no complaints today.     No Known Allergies   Current Outpatient Medications:  .  benztropine (COGENTIN) 0.5 MG tablet, Take 0.5 mg by mouth 2 (two) times daily., Disp: , Rfl:  .  cetirizine (ZYRTEC) 10 MG tablet, Take 10 mg by mouth daily., Disp: , Rfl:  .  chlorproMAZINE (THORAZINE) 25 MG tablet, Take 25 mg by mouth 3 (three) times daily., Disp: , Rfl:  .  donepezil (ARICEPT) 23 MG TABS tablet, Take 23 mg by mouth at bedtime. , Disp: , Rfl:  .  Fluticasone-Salmeterol (ADVAIR) 250-50 MCG/DOSE AEPB, Inhale 1 puff into the lungs 2 (two) times daily., Disp: , Rfl:  .  gabapentin (NEURONTIN) 400 MG capsule, Take 400 mg by mouth every morning., Disp: , Rfl:  .  guaiFENesin (MUCINEX) 600 MG 12 hr tablet, Take 600 mg by mouth 2 (two) times daily as needed., Disp: , Rfl:  .  LORazepam (ATIVAN) 1 MG tablet, Take 1 mg by mouth 2 (two) times daily., Disp: , Rfl:  .  memantine (NAMENDA XR) 28 MG CP24 24 hr capsule, Take 28 mg by mouth daily., Disp: ,  Rfl:  .  mirtazapine (REMERON) 30 MG tablet, Take 30 mg by mouth at bedtime., Disp: , Rfl:  .  Nutritional Supplements (NUTRITIONAL SUPPLEMENT PO), Take 1 Can by mouth 3 (three) times daily., Disp: , Rfl:  .  omega-3 acid ethyl esters (LOVAZA) 1 G capsule, Take 4 capsules by mouth 2 (two) times daily., Disp: , Rfl:  .  omeprazole (PRILOSEC) 20 MG capsule, Take 20 mg by mouth every morning., Disp: , Rfl:  .  promethazine (PHENERGAN) 25 MG tablet, Take 25 mg by mouth every 6 (six) hours as needed for nausea or vomiting., Disp: , Rfl:  .  Psyllium (METAMUCIL) WAFR, Take 1 Wafer by mouth at bedtime., Disp: , Rfl:  .  QUEtiapine (SEROQUEL) 400 MG tablet, Take 400 mg by mouth 2 (two) times daily. , Disp: , Rfl:  .  simvastatin (ZOCOR) 20 MG tablet, Take 20 mg by mouth at bedtime., Disp: , Rfl:  .  tamsulosin (FLOMAX) 0.4 MG CAPS capsule, Take 0.4 mg by mouth at bedtime., Disp: , Rfl:  .  Vitamin D, Ergocalciferol, (DRISDOL) 50000 UNITS CAPS capsule, Take 50,000 Units by mouth every 7 (seven) days., Disp: , Rfl:   Review of Systems  Unable to perform ROS: Dementia  when asked verbally patient declined all ROS   Social History   Tobacco Use  .  Smoking status: Current Every Day Smoker    Packs/day: 0.20    Types: Cigarettes  . Smokeless tobacco: Never Used  Substance Use Topics  . Alcohol use: No   Objective:   BP 100/80 (BP Location: Left Arm, Patient Position: Sitting, Cuff Size: Normal)   Pulse 78   Temp 97.6 F (36.4 C) (Oral)   Resp 16   Wt 152 lb (68.9 kg)   SpO2 99%   BMI 18.87 kg/m  Vitals:   11/27/17 1445  BP: 100/80  Pulse: 78  Resp: 16  Temp: 97.6 F (36.4 C)  TempSrc: Oral  SpO2: 99%  Weight: 152 lb (68.9 kg)     Physical Exam  Constitutional: He is oriented to person, place, and time. He appears well-developed and well-nourished.  HENT:  Head: Normocephalic and atraumatic.  Right Ear: External ear normal.  Left Ear: External ear normal.  Nose: Nose normal.    Mouth/Throat: Oropharynx is clear and moist.  Eyes: Conjunctivae and EOM are normal. Pupils are equal, round, and reactive to light. Right eye exhibits no discharge.  Neck: Normal range of motion. Neck supple. No tracheal deviation present. No thyromegaly present.  Cardiovascular: Normal rate, regular rhythm, normal heart sounds and intact distal pulses.  No murmur heard. Pulmonary/Chest: Effort normal and breath sounds normal. No respiratory distress. He has no wheezes. He has no rales. He exhibits no tenderness.  Abdominal: Soft. He exhibits no distension and no mass. There is no tenderness. There is no rebound and no guarding.  Musculoskeletal: Normal range of motion. He exhibits no edema or tenderness.  Lymphadenopathy:    He has no cervical adenopathy.  Neurological: He is alert and oriented to person, place, and time. He has normal reflexes. No cranial nerve deficit. He exhibits normal muscle tone. Coordination normal.  Skin: Skin is warm and dry. No rash noted. No erythema.  Psychiatric: He has a normal mood and affect. His behavior is normal. His speech is tangential. Cognition and memory are impaired.       Assessment & Plan:     1. Establishing care with new doctor, encounter for Establishing from [facility]. Records requested.  2. Chronic schizophrenia ([place]) Stable. Lorazepam refilled as below. Continue Seroquel 400mg  BID, Cholropromazine 25mg  TID, and Benztropine 0.5mg  BID.  - LORazepam (ATIVAN) 1 MG tablet; Take 1 tablet (1 mg total) by mouth 2 (two) times daily.  Dispense: 180 tablet; Refill: 1  3. Dementia associated with other underlying disease with behavioral disturbance Stable. Continue Memantine XR 28mg  daily and Donepezil 23mg  daily.   4. Mixed simple and mucopurulent chronic bronchitis (HCC) Stable. Continue Advair 250-50 BID. Stop smoking. Patient declines.   5. Nocturia Stable on tamsulosin 0.4mg .   6. Hypercholesterolemia Stable on  Simvastatin 20mg . Once records obtained will f/u labs as needed.   7. Mild protein-calorie malnutrition ([place]) Continue nutritional supplementation one can between meals prn.   8. Neuropathy Stable. Continue Gabapentin 400mg  daily.   9. Seasonal allergies Stable. Continue Zyrtec prn.        Mar Daring, PA-C  [facility]

## 2017-11-30 ENCOUNTER — Encounter: Payer: Self-pay | Admitting: Physician Assistant

## 2017-12-08 ENCOUNTER — Telehealth: Payer: Self-pay | Admitting: Physician Assistant

## 2017-12-08 DIAGNOSIS — E781 Pure hyperglyceridemia: Secondary | ICD-10-CM

## 2017-12-08 DIAGNOSIS — E7800 Pure hypercholesterolemia, unspecified: Secondary | ICD-10-CM

## 2017-12-08 NOTE — Telephone Encounter (Signed)
Joseph Holt pt's caregiver is requesting a discontinue order for omega-3 acid ethyl esters (LOVAZA) 1 G capsule because Medicaid won't pay for the medication and an order for the medication that is going to replace that medication. Fax# [phone]. Please advise. Thanks TNP

## 2017-12-08 NOTE — Telephone Encounter (Signed)
Please review. Thanks!  

## 2017-12-09 NOTE — Telephone Encounter (Signed)
LMTCB  Thanks  -Hedwig Mcfall

## 2017-12-09 NOTE — Telephone Encounter (Signed)
Ok to discontinue lovaza 1g. The only other similar medicine is Vascepa 1g. If this is covered can give verbal order to switch to this. It is dosed take 2 1 g capsules twice daily with meals. #360 capsules 3 refills

## 2017-12-11 NOTE — Telephone Encounter (Signed)
LMTCB  Thanks,  -Joseline 

## 2017-12-11 NOTE — Telephone Encounter (Signed)
Nance Pear states he needs this new medication faxed to him as a hard copy. (Fax # [phone].) This needs to say to D/C Lovaza, and needs the new sig, as well as provider's signature. This also needs to be sent to [facility].

## 2017-12-14 MED ORDER — ICOSAPENT ETHYL 1 G PO CAPS: 2.0000 | ORAL_CAPSULE | Freq: Two times a day (BID) | ORAL | 1 refills | Status: DC

## 2017-12-14 NOTE — Telephone Encounter (Signed)
Written order. Please fax to [place] at number below. Written Rx handed to [place].

## 2017-12-14 NOTE — Telephone Encounter (Signed)
Prescription faxed

## 2017-12-16 ENCOUNTER — Telehealth: Payer: Self-pay | Admitting: Physician Assistant

## 2017-12-16 DIAGNOSIS — F0281 Dementia in other diseases classified elsewhere with behavioral disturbance: Secondary | ICD-10-CM

## 2017-12-16 DIAGNOSIS — F02818 Dementia in other diseases classified elsewhere, unspecified severity, with other behavioral disturbance: Secondary | ICD-10-CM

## 2017-12-16 NOTE — Telephone Encounter (Signed)
Please review. Thanks!  

## 2017-12-16 NOTE — Telephone Encounter (Signed)
Joseph Holt ins. Will not cover Donepezil 23 mg. But will cover Donepezil 10 mg. 1 po twice daily.    The group home needs this change for their records as well as a new rx sent to [facility]

## 2017-12-17 MED ORDER — MEMANTINE HCL-DONEPEZIL HCL 28-10 MG PO CP24
1.0000 | ORAL_CAPSULE | Freq: Every day | ORAL | 3 refills | Status: DC
Start: 2017-12-17 — End: 2018-01-13

## 2017-12-17 NOTE — Telephone Encounter (Signed)
Spoke with Marcello Moores pharmacist who is in charge of the group home and advised as directed below.  Thanks,  -Asaad Gulley

## 2017-12-17 NOTE — Telephone Encounter (Signed)
Call [facility] and cancel donepezil and memantine. Will start Namzaric so it is only one pill he has to take. I will send written order to Mr. Laurance Flatten.

## 2017-12-22 ENCOUNTER — Telehealth: Payer: Self-pay | Admitting: Physician Assistant

## 2017-12-22 NOTE — Telephone Encounter (Signed)
Thomas from [facility] called to ask that you place an electronic note to [place] to  d/c the individual medications, Namenda and Donepezil since he has now been placed on Namzaric 28-10 mg.

## 2017-12-23 ENCOUNTER — Telehealth: Payer: Self-pay

## 2017-12-23 NOTE — Telephone Encounter (Signed)
Mr. Joseph Holt advised that we have faxed discontinued medication list and new medication list to fax #[phone].

## 2017-12-23 NOTE — Telephone Encounter (Signed)
I think we faxed over a written Rx when I switched him.  Josie did you keep a copy of this?

## 2017-12-23 NOTE — Telephone Encounter (Signed)
Yes this was done when I called the pharmacy and fax the prescription also to Mr. Laurance Flatten.  Thanks,  -Joseline

## 2017-12-29 ENCOUNTER — Telehealth: Payer: Self-pay | Admitting: Physician Assistant

## 2017-12-29 NOTE — Telephone Encounter (Signed)
Please review. Thanks!  

## 2017-12-29 NOTE — Telephone Encounter (Signed)
Nance Pear with assisted living home for pt states they received a fax for DC orders and what was to be put in place for pt.  Elberta Fortis states they can not read the fax that was sent.  Please write clearer and make sure it is ledgable.  Please re fax.  His call back is [phone]

## 2017-12-30 ENCOUNTER — Telehealth: Payer: Self-pay | Admitting: Physician Assistant

## 2017-12-30 NOTE — Telephone Encounter (Signed)
Written and will give to Josie to fax

## 2017-12-30 NOTE — Telephone Encounter (Signed)
Prescription to D/C Donesepil 23mg .  D/C Memantine 28mg . Start Memantine-Donepesil(Namzaric) 28-10mg  capsule as prescribed faxed to [phone]. LM for Mr. Laurance Flatten that it has been faxed and if still unable to read is probably best to come by the office and get a copy of the prescriptions.

## 2018-01-01 ENCOUNTER — Emergency Department
Admission: EM | Admit: 2018-01-01 | Discharge: 2018-01-02 | Disposition: A | Discharge status: Other Acute Inpatient Hospital | Payer: Medicare Other | Attending: Emergency Medicine | Admitting: Emergency Medicine

## 2018-01-01 ENCOUNTER — Other Ambulatory Visit: Payer: Self-pay

## 2018-01-01 DIAGNOSIS — F17200 Nicotine dependence, unspecified, uncomplicated: Secondary | ICD-10-CM | POA: Diagnosis present

## 2018-01-01 DIAGNOSIS — F209 Schizophrenia, unspecified: Secondary | ICD-10-CM

## 2018-01-01 DIAGNOSIS — F200 Paranoid schizophrenia: Secondary | ICD-10-CM | POA: Diagnosis not present

## 2018-01-01 DIAGNOSIS — Z79899 Other long term (current) drug therapy: Secondary | ICD-10-CM | POA: Insufficient documentation

## 2018-01-01 DIAGNOSIS — R419 Unspecified symptoms and signs involving cognitive functions and awareness: Secondary | ICD-10-CM | POA: Insufficient documentation

## 2018-01-01 DIAGNOSIS — F17210 Nicotine dependence, cigarettes, uncomplicated: Secondary | ICD-10-CM | POA: Insufficient documentation

## 2018-01-01 LAB — URINE DRUG SCREEN, QUALITATIVE (ARMC ONLY)
Amphetamines, Ur Screen: NOT DETECTED
BENZODIAZEPINE, UR SCRN: POSITIVE — AB
Barbiturates, Ur Screen: NOT DETECTED
CANNABINOID 50 NG, UR ~~LOC~~: NOT DETECTED
Cocaine Metabolite,Ur ~~LOC~~: NOT DETECTED
MDMA (Ecstasy)Ur Screen: POSITIVE — AB
Methadone Scn, Ur: NOT DETECTED
Opiate, Ur Screen: NOT DETECTED
PHENCYCLIDINE (PCP) UR S: NOT DETECTED
Tricyclic, Ur Screen: POSITIVE — AB

## 2018-01-01 LAB — COMPREHENSIVE METABOLIC PANEL
ALT: 18 U/L (ref 17–63)
ANION GAP: 10 (ref 5–15)
AST: 25 U/L (ref 15–41)
Albumin: 3.9 g/dL (ref 3.5–5.0)
Alkaline Phosphatase: 64 U/L (ref 38–126)
BUN: 19 mg/dL (ref 6–20)
CO2: 23 mmol/L (ref 22–32)
Calcium: 9.2 mg/dL (ref 8.9–10.3)
Chloride: 105 mmol/L (ref 101–111)
Creatinine, Ser: 1.1 mg/dL (ref 0.61–1.24)
Glucose, Bld: 121 mg/dL — ABNORMAL HIGH (ref 65–99)
POTASSIUM: 4.2 mmol/L (ref 3.5–5.1)
Sodium: 138 mmol/L (ref 135–145)
Total Bilirubin: 0.4 mg/dL (ref 0.3–1.2)
Total Protein: 8.2 g/dL — ABNORMAL HIGH (ref 6.5–8.1)

## 2018-01-01 LAB — CBC
HCT: 39.6 % — ABNORMAL LOW (ref 40.0–52.0)
Hemoglobin: 12.9 g/dL — ABNORMAL LOW (ref 13.0–18.0)
MCH: 30.9 pg (ref 26.0–34.0)
MCHC: 32.7 g/dL (ref 32.0–36.0)
MCV: 94.6 fL (ref 80.0–100.0)
PLATELETS: 350 10*3/uL (ref 150–440)
RBC: 4.18 MIL/uL — ABNORMAL LOW (ref 4.40–5.90)
RDW: 13.9 % (ref 11.5–14.5)
WBC: 8.3 10*3/uL (ref 3.8–10.6)

## 2018-01-01 LAB — ETHANOL: Alcohol, Ethyl (B): <10 mg/dL (ref <10)
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
## 2018-01-01 NOTE — ED Notes (Signed)
Patient Placed in the middle triage holding area with [employer] officer.

## 2018-01-01 NOTE — ED Notes (Signed)
TTS with patient.

## 2018-01-01 NOTE — ED Notes (Signed)
Pt very reluctant to dress out in front of male staff. Requesting males. RN for room 21 notified and to sent male tech for dress out.

## 2018-01-01 NOTE — ED Notes (Signed)
Sigmund Hazel from [facility]) stated they would be willing to take patient back to facility when cleared by hospital.

## 2018-01-01 NOTE — ED Notes (Signed)
Pt. States he no longer wants to live at current facility.  Pt. States he had been living there for 15 years.  Pt. Denies SI/HI AV/H.
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
## 2018-01-02 ENCOUNTER — Other Ambulatory Visit: Payer: Self-pay

## 2018-01-02 ENCOUNTER — Inpatient Hospital Stay
Admission: AD | Admit: 2018-01-02 | Discharge: 2018-01-14 | DRG: 885 | Disposition: A | Discharge status: Home / Self Care | Payer: Medicare Other | Attending: Psychiatry | Admitting: Psychiatry

## 2018-01-02 ENCOUNTER — Encounter: Payer: Self-pay | Admitting: Psychiatry

## 2018-01-02 DIAGNOSIS — Z79899 Other long term (current) drug therapy: Secondary | ICD-10-CM

## 2018-01-02 DIAGNOSIS — G4700 Insomnia, unspecified: Secondary | ICD-10-CM | POA: Diagnosis present

## 2018-01-02 DIAGNOSIS — F200 Paranoid schizophrenia: Principal | ICD-10-CM | POA: Diagnosis present

## 2018-01-02 DIAGNOSIS — F0280 Dementia in other diseases classified elsewhere without behavioral disturbance: Secondary | ICD-10-CM | POA: Diagnosis present

## 2018-01-02 DIAGNOSIS — Z01810 Encounter for preprocedural cardiovascular examination: Secondary | ICD-10-CM | POA: Diagnosis not present

## 2018-01-02 DIAGNOSIS — R251 Tremor, unspecified: Secondary | ICD-10-CM | POA: Diagnosis not present

## 2018-01-02 DIAGNOSIS — F02818 Dementia in other diseases classified elsewhere, unspecified severity, with other behavioral disturbance: Secondary | ICD-10-CM

## 2018-01-02 DIAGNOSIS — F209 Schizophrenia, unspecified: Secondary | ICD-10-CM

## 2018-01-02 DIAGNOSIS — R4189 Other symptoms and signs involving cognitive functions and awareness: Secondary | ICD-10-CM | POA: Diagnosis not present

## 2018-01-02 DIAGNOSIS — Z7951 Long term (current) use of inhaled steroids: Secondary | ICD-10-CM | POA: Diagnosis not present

## 2018-01-02 DIAGNOSIS — N400 Enlarged prostate without lower urinary tract symptoms: Secondary | ICD-10-CM | POA: Diagnosis present

## 2018-01-02 DIAGNOSIS — F17210 Nicotine dependence, cigarettes, uncomplicated: Secondary | ICD-10-CM | POA: Diagnosis present

## 2018-01-02 DIAGNOSIS — G309 Alzheimer's disease, unspecified: Secondary | ICD-10-CM | POA: Diagnosis present

## 2018-01-02 DIAGNOSIS — R42 Dizziness and giddiness: Secondary | ICD-10-CM | POA: Diagnosis not present

## 2018-01-02 DIAGNOSIS — F319 Bipolar disorder, unspecified: Secondary | ICD-10-CM | POA: Diagnosis present

## 2018-01-02 DIAGNOSIS — L509 Urticaria, unspecified: Secondary | ICD-10-CM | POA: Diagnosis present

## 2018-01-02 DIAGNOSIS — M810 Age-related osteoporosis without current pathological fracture: Secondary | ICD-10-CM | POA: Diagnosis present

## 2018-01-02 DIAGNOSIS — I959 Hypotension, unspecified: Secondary | ICD-10-CM | POA: Diagnosis not present

## 2018-01-02 DIAGNOSIS — K219 Gastro-esophageal reflux disease without esophagitis: Secondary | ICD-10-CM | POA: Diagnosis present

## 2018-01-02 DIAGNOSIS — F17200 Nicotine dependence, unspecified, uncomplicated: Secondary | ICD-10-CM | POA: Diagnosis present

## 2018-01-02 DIAGNOSIS — F0281 Dementia in other diseases classified elsewhere with behavioral disturbance: Secondary | ICD-10-CM

## 2018-01-02 MED ORDER — TAMSULOSIN HCL 0.4 MG PO CAPS
0.4000 mg | ORAL_CAPSULE | Freq: Every day | ORAL | Status: DC
Start: 2018-01-03 — End: 2018-01-14
  Administered 2018-01-03 – 2018-01-14 (×12): 0.4 mg via ORAL
  Filled 2018-01-02 (×12): qty 1

## 2018-01-02 MED ORDER — GABAPENTIN 100 MG PO CAPS
400.0000 mg | ORAL_CAPSULE | Freq: Three times a day (TID) | ORAL | Status: DC
  Administered 2018-01-03 – 2018-01-04 (×4): 400 mg via ORAL
  Filled 2018-01-02 (×4): qty 4

## 2018-01-02 MED ORDER — HYDROXYZINE HCL 25 MG PO TABS: 25.0000 mg | ORAL_TABLET | Freq: Three times a day (TID) | ORAL | Status: DC | PRN

## 2018-01-02 MED ORDER — SIMVASTATIN 10 MG PO TABS: 20.0000 mg | ORAL_TABLET | Freq: Every day | ORAL | Status: DC

## 2018-01-02 MED ORDER — MAGNESIUM HYDROXIDE 400 MG/5ML PO SUSP: 30.0000 mL | Freq: Every day | ORAL | Status: DC | PRN

## 2018-01-02 MED ORDER — TAMSULOSIN HCL 0.4 MG PO CAPS
0.4000 mg | ORAL_CAPSULE | Freq: Every day | ORAL | Status: DC
  Administered 2018-01-02: 0.4 mg via ORAL
  Filled 2018-01-02: qty 1

## 2018-01-02 MED ORDER — ACETAMINOPHEN 325 MG PO TABS: 650.0000 mg | ORAL_TABLET | Freq: Four times a day (QID) | ORAL | Status: DC | PRN

## 2018-01-02 MED ORDER — TRAZODONE HCL 100 MG PO TABS
100.0000 mg | ORAL_TABLET | Freq: Every evening | ORAL | Status: DC | PRN
  Administered 2018-01-02 – 2018-01-05 (×2): 100 mg via ORAL
  Filled 2018-01-02 (×2): qty 1

## 2018-01-02 MED ORDER — QUETIAPINE FUMARATE 200 MG PO TABS: 400.0000 mg | ORAL_TABLET | Freq: Every day | ORAL | Status: DC

## 2018-01-02 MED ORDER — CHLORPROMAZINE HCL 25 MG PO TABS
25.0000 mg | ORAL_TABLET | Freq: Three times a day (TID) | ORAL | Status: DC
  Administered 2018-01-02: 25 mg via ORAL
  Filled 2018-01-02 (×2): qty 1

## 2018-01-02 MED ORDER — SIMVASTATIN 40 MG PO TABS
20.0000 mg | ORAL_TABLET | Freq: Every day | ORAL | Status: DC
  Administered 2018-01-02 – 2018-01-13 (×12): 20 mg via ORAL
  Filled 2018-01-02 (×12): qty 1

## 2018-01-02 MED ORDER — QUETIAPINE FUMARATE 200 MG PO TABS
400.0000 mg | ORAL_TABLET | Freq: Every day | ORAL | Status: DC
Start: 2018-01-02 — End: 2018-01-02

## 2018-01-02 MED ORDER — DIVALPROEX SODIUM 250 MG PO DR TAB
250.0000 mg | DELAYED_RELEASE_TABLET | Freq: Three times a day (TID) | ORAL | Status: DC
  Administered 2018-01-02 – 2018-01-04 (×5): 250 mg via ORAL
  Filled 2018-01-02 (×5): qty 1

## 2018-01-02 MED ORDER — DIVALPROEX SODIUM 250 MG PO DR TAB
250.0000 mg | DELAYED_RELEASE_TABLET | Freq: Three times a day (TID) | ORAL | Status: DC
  Administered 2018-01-02: 250 mg via ORAL
  Filled 2018-01-02: qty 1

## 2018-01-02 MED ORDER — ALUM & MAG HYDROXIDE-SIMETH 200-200-20 MG/5ML PO SUSP
30.0000 mL | ORAL | Status: DC | PRN
  Administered 2018-01-04: 30 mL via ORAL
  Filled 2018-01-02: qty 30

## 2018-01-02 MED ORDER — CHLORPROMAZINE HCL 25 MG PO TABS
25.0000 mg | ORAL_TABLET | Freq: Three times a day (TID) | ORAL | Status: DC
  Administered 2018-01-03 – 2018-01-14 (×33): 25 mg via ORAL
  Filled 2018-01-02 (×37): qty 1

## 2018-01-02 MED ORDER — GABAPENTIN 100 MG PO CAPS
400.0000 mg | ORAL_CAPSULE | Freq: Three times a day (TID) | ORAL | Status: DC
  Administered 2018-01-02: 400 mg via ORAL
  Filled 2018-01-02 (×2): qty 4

## 2018-01-02 MED ORDER — NICOTINE 21 MG/24HR TD PT24
21.0000 mg | MEDICATED_PATCH | Freq: Every day | TRANSDERMAL | Status: DC
  Administered 2018-01-02 – 2018-01-14 (×12): 21 mg via TRANSDERMAL
  Filled 2018-01-02 (×12): qty 1

## 2018-01-02 MED ORDER — QUETIAPINE FUMARATE 100 MG PO TABS
100.0000 mg | ORAL_TABLET | Freq: Every day | ORAL | Status: DC
  Administered 2018-01-02: 100 mg via ORAL
  Filled 2018-01-02: qty 1

## 2018-01-02 NOTE — ED Notes (Addendum)
Per CSW patient is to return to current living facility.

## 2018-01-02 NOTE — ED Notes (Signed)
Pt. Up and using bathroom.  Pt. Able to perform without assistance, pt. Returned to room with steady gait.

## 2018-01-02 NOTE — Tx Team (Signed)
Initial Treatment Plan 01/02/2018 6:10 PM [place]. VOH:[id number]    PATIENT STRESSORS: Loss of communication with his brothers Other: Being homeless upon discharge   PATIENT STRENGTHS: Ability for insight Active sense of humor Communication skills Religious Affiliation   PATIENT IDENTIFIED PROBLEMS:                      DISCHARGE CRITERIA:  Adequate post-discharge living arrangements Improved stabilization in mood, thinking, and/or behavior Safe-care adequate arrangements made  PRELIMINARY DISCHARGE PLAN: Outpatient therapy  PATIENT/FAMILY INVOLVEMENT: This treatment plan has been presented to and reviewed with the patient, Joseph Curb., and/or family member.  The patient and family have been given the opportunity to ask questions and make suggestions.  Joseph Rings Koleson Reifsteck, RN 01/02/2018, 6:10 PM

## 2018-01-02 NOTE — Progress Notes (Signed)
LCSW consulted EDP and ED RN charge nurse and Chyrl Civatte and reviewed patients discharge options. Patient stated he refused to go back to Group home and will go to [facility]. LCSW provided cab voucher ED RN.  LCSW awaiting EDP to discharge patient.   LCSW will give Group home a courtesy call to inform them patient is discharged and is choosing not to return.  LCSW provided patient some additional clothing and he will make his own arrangements to pick up his personal effects later.  No further needs at this time   Billiejean Schimek LCSW

## 2018-01-02 NOTE — ED Notes (Signed)
Pt. Up and using bathroom.

## 2018-01-02 NOTE — Progress Notes (Signed)
LCSW consulted with ED RN and EDP and ED Charge Nurse. This patient will need to be assessed and have his IVC rescinded.  Dr Bary Leriche on her way  No further needs   [employer] LCSW [phone]

## 2018-01-02 NOTE — ED Provider Notes (Signed)
I was contacted by TTS at the patient is not willing to go back to his current group home.  She is requested to place a social work consult.   Darel Hong, MD 01/02/18 [phone]
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
## 2018-01-02 NOTE — ED Notes (Signed)
Patient resting quietly in room. No noted distress or abnormal behaviors noted. Will continue 15 minute checks and observation by security camera for safety. 

## 2018-01-02 NOTE — Plan of Care (Signed)
Patient newly admitted to the unit, goals not yet met. 
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
## 2018-01-02 NOTE — BH Assessment (Signed)
Pt is requesting to move to a different group home, as he is refusing to return to the one he is currently residing in. Pt provided names of several group homes he would be interested in transferring to.  Joseph Holt of [facility] in [place], [name] is apparently the name of someone who runs a group home called [facility]   Clinician attempted to identify these locations via Google but was unable to find either.
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
## 2018-01-02 NOTE — BHH Group Notes (Signed)
[facility] Group Notes:  (Nursing/MHT/Case Management/Adjunct)  Date:  01/02/2018  Time:  10:52 PM  Type of Therapy:  Group Therapy  Participation Level:  Did Not Attend  mmary of Progress/Problems:  Nehemiah Settle 01/02/2018, 10:52 PM

## 2018-01-02 NOTE — BH Assessment (Signed)
Patient is to be admitted to [facility] by Dr. Bary Leriche.  Attending Physician will be Dr. Bary Leriche.   Patient has been assigned to room 311B, by [place] Nurse T'Ywan.   ER staff is aware of the admission:  Ronnie, ER Sectary   Dr. Jacqualine Code, ER MD   Amy B., Patient's Nurse   Levada Dy, Patient Access.

## 2018-01-02 NOTE — Progress Notes (Signed)
Patient ID: Joseph Curb., male   DOB: 1951/02/27, 67 y.o.   MRN: [id number]  Per State regulations 482.30 this chart was reviewed for medical necessity with respect to the patient's admission/duration of stay.    Next review date: 01/06/18  Debarah Crape, BSN, RN-BC  Case Manager

## 2018-01-03 DIAGNOSIS — F200 Paranoid schizophrenia: Principal | ICD-10-CM

## 2018-01-03 LAB — HEMOGLOBIN A1C
HEMOGLOBIN A1C: 5.3 % (ref 4.8–5.6)
Mean Plasma Glucose: 105.41 mg/dL

## 2018-01-03 LAB — LIPID PANEL
CHOL/HDL RATIO: 3.6 ratio
Cholesterol: 94 mg/dL (ref 0–200)
HDL: 26 mg/dL — AB (ref >40)
LDL CALC: 55 mg/dL (ref 0–99)
Triglycerides: 65 mg/dL (ref <150)
VLDL: 13 mg/dL (ref 0–40)

## 2018-01-03 LAB — TSH: TSH: 1.629 u[IU]/mL (ref 0.350–4.500)

## 2018-01-03 MED ORDER — QUETIAPINE FUMARATE 200 MG PO TABS
200.0000 mg | ORAL_TABLET | Freq: Every day | ORAL | Status: DC
  Administered 2018-01-03: 200 mg via ORAL
  Filled 2018-01-03: qty 1

## 2018-01-03 NOTE — Progress Notes (Signed)
D- Patient alert and oriented. Patient presents in a pleasant mood on assessment stating that he didn't sleep well last night because he is "hyped up, and I haven't been eating a lot. Patient denies SI, HI, AVH, and pain at this time. Patient also denies any signs of depression/anxiety at this time stating to this writer "nothing out of the ordinary". Patient's goals for today is to "watch out for myself, be on my p's and q's, and keep to myself, proceed with caution".   A- Scheduled medications administered to patient, per MD orders. Support and encouragement provided.  Routine safety checks conducted every 15 minutes.  Patient informed to notify staff with problems or concerns.  R- No adverse drug reactions noted. Patient contracts for safety at this time. Patient compliant with medications and treatment plan. Patient receptive, calm, and cooperative. Patient interacts well with others on the unit. Patient remains safe at this time.
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
## 2018-01-03 NOTE — BHH Group Notes (Signed)
LCSW Group Therapy Note 01/03/2018 1:15pm  Type of Therapy and Topic: Group Therapy: Feelings Around Returning Home & Establishing a Supportive Framework and Supporting Oneself When Supports Not Available  Participation Level: Did Not Attend  Description of Group:  Patients first processed thoughts and feelings about upcoming discharge. These included fears of upcoming changes, lack of change, new living environments, judgements and expectations from others and overall stigma of mental health issues. The group then discussed the definition of a supportive framework, what that looks and feels like, and how do to discern it from an unhealthy non-supportive network. The group identified different types of supports as well as what to do when your family/friends are less than helpful or unavailable  Therapeutic Goals  1. Patient will identify one healthy supportive network that they can use at discharge. 2. Patient will identify one factor of a supportive framework and how to tell it from an unhealthy network. 3. Patient able to identify one coping skill to use when they do not have positive supports from others. 4. Patient will demonstrate ability to communicate their needs through discussion and/or role plays.  Summary of Patient Progress:    Therapeutic Modalities Cognitive Behavioral Therapy Motivational Interviewing   [place], LCSW 01/03/2018 11:07 AM

## 2018-01-03 NOTE — B[facility] Suicide Risk Assessment (Signed)
[facility] Admission Suicide Risk Assessment   Nursing information obtained from:    Demographic factors:    Current Mental Status:    Loss Factors:    Historical Factors:    Risk Reduction Factors:     Total Time spent with patient: 30 minutes Principal Problem: <principal problem not specified> Diagnosis:   Patient Active Problem List   Diagnosis Date Noted  . Tobacco use disorder [F17.200] 01/02/2018  . Dementia [F03.90]   . Paranoid schizophrenia ([place]) [F20.0] 02/21/2015  . Social maladjustment [Z60.9] 02/21/2015   Subjective Data: psychotic break  Continued Clinical Symptoms:  Alcohol Use Disorder Identification Test Final Score (AUDIT): 0 The "Alcohol Use Disorders Identification Test", Guidelines for Use in Primary Care, Second Edition.  World Pharmacologist [facility]). Score between 0-7:  no or low risk or alcohol related problems. Score between 8-15:  moderate risk of alcohol related problems. Score between 16-19:  high risk of alcohol related problems. Score 20 or above:  warrants further diagnostic evaluation for alcohol dependence and treatment.   CLINICAL FACTORS:   Schizophrenia:   Paranoid or undifferentiated type Currently Psychotic   Musculoskeletal: Strength & Muscle Tone: within normal limits Gait & Station: normal Patient leans: N/A  Psychiatric Specialty Exam: Physical Exam  Nursing note and vitals reviewed. Psychiatric: He has a normal mood and affect. His speech is normal and behavior is normal. Thought content is paranoid and delusional. Cognition and memory are normal. He expresses impulsivity.    Review of Systems  Neurological: Negative.   Psychiatric/Behavioral: The patient has insomnia.   All other systems reviewed and are negative.   Blood pressure 105/81, pulse (!) 114, temperature 97.8 F (36.6 C), temperature source Oral, resp. rate 18, height 6\' 1"  (1.854 m), weight 64 kg (141 lb), SpO2 94 %.Body mass index is 18.6 kg/m.  General  Appearance: Casual  Eye Contact:  Good  Speech:  Clear and Coherent  Volume:  Normal  Mood:  Dysphoric and Irritable  Affect:  Congruent  Thought Process:  Goal Directed and Descriptions of Associations: Intact  Orientation:  Full (Time, Place, and Person)  Thought Content:  Illogical, Delusions and Paranoid Ideation  Suicidal Thoughts:  No  Homicidal Thoughts:  No  Memory:  Immediate;   Fair Recent;   Fair Remote;   Fair  Judgement:  Poor  Insight:  Lacking  Psychomotor Activity:  Normal  Concentration:  Concentration: Fair and Attention Span: Fair  Recall:  [employer] of Knowledge:  Fair  Language:  Fair  Akathisia:  No  Handed:  Right  AIMS (if indicated):     Assets:  Communication Skills Desire for Improvement Financial Resources/Insurance Housing Physical Health Resilience Social Support  ADL's:  Intact  Cognition:  WNL  Sleep:  Number of Hours: 3      COGNITIVE FEATURES THAT CONTRIBUTE TO RISK:  None    SUICIDE RISK:   Moderate:  Frequent suicidal ideation with limited intensity, and duration, some specificity in terms of plans, no associated intent, good self-control, limited dysphoria/symptomatology, some risk factors present, and identifiable protective factors, including available and accessible social support.  PLAN OF CARE: hospital admission, medication management, discharge planning.  Joseph Holt is a 67 year old male with a history of schizophrenia admitted for psychotic break.  #Psychosis -we restarted Thorazine 25 mg TID -Seroquel 100 mg nightly -Trazodone 100 mg nightly PRN -Depakote 250 mg TID  #Smoking cessation -nicotine patch is available  # BPH -Flomax 0.4 mg daily  #Dislipidemia -Zocor 20  mg   #Metabolic syndrome monitoring -Lipid panel, TSH and HgbA1C are pending -EKG  #Disposition -patient will return to his group home -follow up with his psychiatrist      I certify that inpatient services furnished can reasonably be  expected to improve the patient's condition.   Orson Slick, MD 01/03/2018, 2:40 PM

## 2018-01-03 NOTE — Plan of Care (Signed)
Patient states that he didn't sleep well last night and "I haven't been eating a whole lot because I've been hyped up". Patient's goal for today was to "watch out for myself, be on my p's and q's, and keep to myself proceed with caution". Patient denies any signs/symptoms of anxiety stating "nothing out of the ordinary". Patient is safe on the unit at this time.  Problem: Education: Goal: Ability to state activities that reduce stress will improve Outcome: Progressing   Problem: Coping: Goal: Ability to identify and develop effective coping behavior will improve Outcome: Progressing   Problem: Self-Concept: Goal: Ability to identify factors that promote anxiety will improve Outcome: Progressing Goal: Level of anxiety will decrease Outcome: Progressing Goal: Ability to modify response to factors that promote anxiety will improve Outcome: Progressing

## 2018-01-03 NOTE — Progress Notes (Signed)
Joseph Holt pleasantly manic, and hyper vebal, he was medication compliant although on shift. He is still up talking to himself in the dayroom at this time. Will continue to monitor behavior and ensure safety.
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
## 2018-01-04 MED ORDER — DIVALPROEX SODIUM 500 MG PO DR TAB
500.0000 mg | DELAYED_RELEASE_TABLET | Freq: Two times a day (BID) | ORAL | Status: DC
  Administered 2018-01-04 – 2018-01-14 (×20): 500 mg via ORAL
  Filled 2018-01-04 (×20): qty 1

## 2018-01-04 MED ORDER — MIRTAZAPINE 15 MG PO TABS
30.0000 mg | ORAL_TABLET | Freq: Every day | ORAL | Status: DC
  Administered 2018-01-04 – 2018-01-13 (×10): 30 mg via ORAL
  Filled 2018-01-04 (×10): qty 2

## 2018-01-04 MED ORDER — DONEPEZIL HCL 5 MG PO TABS
10.0000 mg | ORAL_TABLET | Freq: Every day | ORAL | Status: DC
  Administered 2018-01-04 – 2018-01-05 (×2): 10 mg via ORAL
  Filled 2018-01-04 (×2): qty 2

## 2018-01-04 MED ORDER — LORAZEPAM 1 MG PO TABS
1.0000 mg | ORAL_TABLET | Freq: Two times a day (BID) | ORAL | Status: DC
  Administered 2018-01-04 – 2018-01-06 (×4): 1 mg via ORAL
  Filled 2018-01-04 (×4): qty 1

## 2018-01-04 MED ORDER — DONEPEZIL HCL 23 MG PO TABS: 23.0000 mg | ORAL_TABLET | Freq: Every day | ORAL | Status: DC

## 2018-01-04 MED ORDER — BENZTROPINE MESYLATE 1 MG PO TABS
0.5000 mg | ORAL_TABLET | Freq: Two times a day (BID) | ORAL | Status: DC
Start: 2018-01-04 — End: 2018-01-05
  Administered 2018-01-04 – 2018-01-05 (×2): 0.5 mg via ORAL
  Filled 2018-01-04 (×2): qty 1

## 2018-01-04 MED ORDER — QUETIAPINE FUMARATE 200 MG PO TABS
400.0000 mg | ORAL_TABLET | Freq: Every day | ORAL | Status: DC
  Administered 2018-01-04: 400 mg via ORAL
  Filled 2018-01-04: qty 2

## 2018-01-04 MED ORDER — GABAPENTIN 400 MG PO CAPS
400.0000 mg | ORAL_CAPSULE | Freq: Every day | ORAL | Status: DC
  Administered 2018-01-05 – 2018-01-14 (×10): 400 mg via ORAL
  Filled 2018-01-04 (×10): qty 1

## 2018-01-04 MED ORDER — QUETIAPINE FUMARATE 200 MG PO TABS
400.0000 mg | ORAL_TABLET | Freq: Two times a day (BID) | ORAL | Status: DC
Start: 2018-01-04 — End: 2018-01-04

## 2018-01-04 MED ORDER — MEMANTINE HCL ER 28 MG PO CP24
28.0000 mg | ORAL_CAPSULE | Freq: Every day | ORAL | Status: DC
  Administered 2018-01-04 – 2018-01-14 (×11): 28 mg via ORAL
  Filled 2018-01-04 (×11): qty 1

## 2018-01-04 NOTE — Tx Team (Signed)
Interdisciplinary Treatment and Diagnostic Plan Update  01/04/2018 Time of Session: 11:00am [name]. MRN: [id number]  Principal Diagnosis: Paranoid schizophrenia ([place])  Secondary Diagnoses: Principal Problem:   Paranoid schizophrenia ([place])   Current Medications:  Current Facility-Administered Medications  Medication Dose Route Frequency Provider Last Rate Last Dose  . acetaminophen (TYLENOL) tablet 650 mg  650 mg Oral Q6H PRN Pucilowska, Jolanta B, MD      . alum & mag hydroxide-simeth (MAALOX/MYLANTA) 200-200-20 MG/5ML suspension 30 mL  30 mL Oral Q4H PRN Pucilowska, Jolanta B, MD      . benztropine (COGENTIN) tablet 0.5 mg  0.5 mg Oral BID McNew, Holly R, MD      . chlorproMAZINE (THORAZINE) tablet 25 mg  25 mg Oral TID Pucilowska, Jolanta B, MD   25 mg at 01/04/18 0842  . divalproex (DEPAKOTE) DR tablet 500 mg  500 mg Oral Q12H McNew, Holly R, MD      . donepezil (ARICEPT) tablet 10 mg  10 mg Oral QHS McNew, Tyson Babinski, MD      . Derrill Memo ON 01/05/2018] gabapentin (NEURONTIN) capsule 400 mg  400 mg Oral Daily McNew, Tyson Babinski, MD      . hydrOXYzine (ATARAX/VISTARIL) tablet 25 mg  25 mg Oral TID PRN Pucilowska, Jolanta B, MD      . LORazepam (ATIVAN) tablet 1 mg  1 mg Oral BID McNew, Holly R, MD      . magnesium hydroxide (MILK OF MAGNESIA) suspension 30 mL  30 mL Oral Daily PRN Pucilowska, Jolanta B, MD      . memantine (NAMENDA XR) 24 hr capsule 28 mg  28 mg Oral Daily McNew, Holly R, MD      . mirtazapine (REMERON) tablet 30 mg  30 mg Oral QHS McNew, Holly R, MD      . nicotine (NICODERM CQ - dosed in mg/24 hours) patch 21 mg  21 mg Transdermal Q0600 Pucilowska, Jolanta B, MD   21 mg at 01/04/18 0648  . QUEtiapine (SEROQUEL) tablet 400 mg  400 mg Oral QHS McNew, Holly R, MD      . simvastatin (ZOCOR) tablet 20 mg  20 mg Oral q1800 Pucilowska, Jolanta B, MD   20 mg at 01/03/18 1747  . tamsulosin (FLOMAX) capsule 0.4 mg  0.4 mg Oral Daily Pucilowska, Jolanta B, MD   0.4 mg at  01/04/18 0841  . traZODone (DESYREL) tablet 100 mg  100 mg Oral QHS PRN Pucilowska, Jolanta B, MD   100 mg at 01/02/18 2132   PTA Medications: Medications Prior to Admission  Medication Sig Dispense Refill Last Dose  . benztropine (COGENTIN) 0.5 MG tablet Take 0.5 mg by mouth 2 (two) times daily.   Taking  . cetirizine (ZYRTEC) 10 MG tablet Take 10 mg by mouth daily.   Taking  . chlorproMAZINE (THORAZINE) 25 MG tablet Take 25 mg by mouth 3 (three) times daily.   Taking  . Fluticasone-Salmeterol (ADVAIR) 250-50 MCG/DOSE AEPB Inhale 1 puff into the lungs 2 (two) times daily.   Taking  . gabapentin (NEURONTIN) 400 MG capsule Take 400 mg by mouth every morning.   Taking  . guaiFENesin (MUCINEX) 600 MG 12 hr tablet Take 600 mg by mouth 2 (two) times daily as needed.   Taking  . Icosapent Ethyl (VASCEPA) 1 g CAPS Take 2 capsules (2 g total) by mouth 2 (two) times daily. 180 capsule 1   . LORazepam (ATIVAN) 1 MG tablet Take 1 tablet (1 mg total)  by mouth 2 (two) times daily. 180 tablet 1   . Memantine HCl-Donepezil HCl (NAMZARIC) 28-10 MG CP24 Take 1 capsule by mouth daily. 90 capsule 3   . mirtazapine (REMERON) 30 MG tablet Take 30 mg by mouth at bedtime.   Taking  . Nutritional Supplements (NUTRITIONAL SUPPLEMENT PO) Take 1 Can by mouth 3 (three) times daily.   Taking  . omeprazole (PRILOSEC) 20 MG capsule Take 20 mg by mouth every morning.   Taking  . promethazine (PHENERGAN) 25 MG tablet Take 25 mg by mouth every 6 (six) hours as needed for nausea or vomiting.   Taking  . Psyllium (METAMUCIL) WAFR Take 1 Wafer by mouth at bedtime.   Taking  . QUEtiapine (SEROQUEL) 400 MG tablet Take 400 mg by mouth 2 (two) times daily.    Taking  . simvastatin (ZOCOR) 20 MG tablet Take 20 mg by mouth at bedtime.   Taking  . tamsulosin (FLOMAX) 0.4 MG CAPS capsule Take 0.4 mg by mouth at bedtime.   Taking  . Vitamin D, Ergocalciferol, (DRISDOL) 50000 UNITS CAPS capsule Take 50,000 Units by mouth every 7 (seven)  days.   Taking    Patient Stressors: Loss of communication with his brothers Other: Being homeless upon discharge  Patient Strengths: Ability for insight Active sense of humor Communication skills Religious Affiliation  Treatment Modalities: Medication Management, Group therapy, Case management,  1 to 1 session with clinician, Psychoeducation, Recreational therapy.   Physician Treatment Plan for Primary Diagnosis: Paranoid schizophrenia ([place]) Long Term Goal(s): Improvement in symptoms so as ready for discharge NA   Short Term Goals: Ability to identify changes in lifestyle to reduce recurrence of condition will improve Ability to verbalize feelings will improve Ability to disclose and discuss suicidal ideas Ability to demonstrate self-control will improve Ability to identify and develop effective coping behaviors will improve Ability to maintain clinical measurements within normal limits will improve Compliance with prescribed medications will improve Ability to identify triggers associated with substance abuse/mental health issues will improve NA  Medication Management: Evaluate patient's response, side effects, and tolerance of medication regimen.  Therapeutic Interventions: 1 to 1 sessions, Unit Group sessions and Medication administration.  Evaluation of Outcomes: Progressing  Physician Treatment Plan for Secondary Diagnosis: Principal Problem:   Paranoid schizophrenia ([place])  Long Term Goal(s): Improvement in symptoms so as ready for discharge NA   Short Term Goals: Ability to identify changes in lifestyle to reduce recurrence of condition will improve Ability to verbalize feelings will improve Ability to disclose and discuss suicidal ideas Ability to demonstrate self-control will improve Ability to identify and develop effective coping behaviors will improve Ability to maintain clinical measurements within normal limits will improve Compliance with prescribed  medications will improve Ability to identify triggers associated with substance abuse/mental health issues will improve NA     Medication Management: Evaluate patient's response, side effects, and tolerance of medication regimen.  Therapeutic Interventions: 1 to 1 sessions, Unit Group sessions and Medication administration.  Evaluation of Outcomes: Progressing   RN Treatment Plan for Primary Diagnosis: Paranoid schizophrenia ([place]) Long Term Goal(s): Knowledge of disease and therapeutic regimen to maintain health will improve  Short Term Goals: Ability to identify and develop effective coping behaviors will improve and Compliance with prescribed medications will improve  Medication Management: RN will administer medications as ordered by provider, will assess and evaluate patient's response and provide education to patient for prescribed medication. RN will report any adverse and/or side effects to prescribing provider.  Therapeutic Interventions: 1 on 1 counseling sessions, Psychoeducation, Medication administration, Evaluate responses to treatment, Monitor vital signs and CBGs as ordered, Perform/monitor CIWA, COWS, AIMS and Fall Risk screenings as ordered, Perform wound care treatments as ordered.  Evaluation of Outcomes: Progressing   LCSW Treatment Plan for Primary Diagnosis: Paranoid schizophrenia ([place]) Long Term Goal(s): Safe transition to appropriate next level of care at discharge, Engage patient in therapeutic group addressing interpersonal concerns.  Short Term Goals: Engage patient in aftercare planning with referrals and resources, Identify triggers associated with mental health/substance abuse issues and Increase skills for wellness and recovery  Therapeutic Interventions: Assess for all discharge needs, 1 to 1 time with Social worker, Explore available resources and support systems, Assess for adequacy in community support network, Educate family and significant other(s) on  suicide prevention, Complete Psychosocial Assessment, Interpersonal group therapy.  Evaluation of Outcomes: Progressing   Progress in Treatment: Attending groups: Yes. Participating in groups: Yes. Taking medication as prescribed: Yes. Toleration medication: Yes. Family/Significant other contact made: No, will contact:    Patient understands diagnosis: Yes. Discussing patient identified problems/goals with staff: Yes. Medical problems stabilized or resolved: Yes. Denies suicidal/homicidal ideation: Yes. Issues/concerns per patient self-inventory: No. Other:    New problem(s) identified: No, Describe:     New Short Term/Long Term Goal(s):"get meds straightened out"  Discharge Plan or Barriers: Discharge back home to group home and follow up with Dr[facility]Ward at [facility]  Reason for Continuation of Hospitalization: Delusions  [place] Medication stabilization  Estimated Length of Stay: 5-7 days  Attendees: Patient: Joseph Holt 01/04/2018 3:37 PM  Physician: Amador Cunas, MD 01/04/2018 3:37 PM  Nursing: Polly Cobia, MD 01/04/2018 3:37 PM  RN Care Manager: 01/04/2018 3:37 PM  Social Worker: Dossie Arbour, [place] 01/04/2018 3:37 PM  Recreational Therapist:  01/04/2018 3:37 PM  Other: Alden Hipp, [place] 01/04/2018 3:37 PM  Other:  01/04/2018 3:37 PM  Other: 01/04/2018 3:37 PM    Scribe for Treatment Team: August Saucer, LCSW 01/04/2018 3:37 PM

## 2018-01-04 NOTE — Progress Notes (Signed)
Patient was in bed at the beginning of this shift. Remained in bed and did not participate in before bedtime activities. Appeared to be lethargic but denied medical/health concerns. Denied thoughts of self harm Patient requested medications to be taken to him in room. Received his medications and was pleasant. Was encouraged to call staff as needed. Currently sleeping and safety maintained.

## 2018-01-04 NOTE — Progress Notes (Addendum)
Recreation Therapy Notes  Date: 04.01.2019  Time: 9:30 am  Location: Craft Room  Behavioral response: N/A  Intervention Topic: Problem Solving  Discussion/Intervention: Patient did not attend group.  Clinical Observations/Feedback:  Patient did not attend group.  Darnisha Vernet LRT/CTRS         Neelam Tiggs 01/04/2018 1:18 PM

## 2018-01-04 NOTE — Plan of Care (Signed)
Pt stayed in bed stating that he has not been able to get enough rest. Compliant with medications.
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
## 2018-01-05 MED ORDER — AMANTADINE HCL 100 MG PO CAPS
100.0000 mg | ORAL_CAPSULE | Freq: Two times a day (BID) | ORAL | Status: DC
  Administered 2018-01-05 – 2018-01-14 (×18): 100 mg via ORAL
  Filled 2018-01-05 (×18): qty 1

## 2018-01-05 MED ORDER — PANTOPRAZOLE SODIUM 40 MG PO TBEC
40.0000 mg | DELAYED_RELEASE_TABLET | Freq: Every day | ORAL | Status: DC
  Administered 2018-01-05 – 2018-01-14 (×10): 40 mg via ORAL
  Filled 2018-01-05 (×10): qty 1

## 2018-01-05 MED ORDER — QUETIAPINE FUMARATE ER 200 MG PO TB24
400.0000 mg | ORAL_TABLET | Freq: Every day | ORAL | Status: DC
  Administered 2018-01-05: 400 mg via ORAL
  Filled 2018-01-05: qty 2

## 2018-01-05 MED ORDER — ENSURE ENLIVE PO LIQD
237.0000 mL | Freq: Two times a day (BID) | ORAL | Status: DC
  Administered 2018-01-06 – 2018-01-14 (×14): 237 mL via ORAL

## 2018-01-05 NOTE — Progress Notes (Signed)
Recreation Therapy Notes  Date: 04.02.2019  Time: 9:30 am  Location: Craft Room  Behavioral response: N/A  Intervention Topic: Communication  Discussion/Intervention: Patient did not attend group.  Clinical Observations/Feedback:  Patient did not attend group.  Zoriana Oats LRT/CTRS          Jayliana Valencia 01/05/2018 10:48 AM

## 2018-01-05 NOTE — Plan of Care (Signed)
  Problem: Education: Goal: Ability to state activities that reduce stress will improve Outcome: Progressing   Problem: Coping: Goal: Ability to identify and develop effective coping behavior will improve Outcome: Progressing   Problem: Self-Concept: Goal: Level of anxiety will decrease Outcome: Progressing
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
## 2018-01-05 NOTE — B[facility] Group Notes (Signed)
01/05/2018 1PM  Type of Therapy/Topic:  Group Therapy:  Feelings about Diagnosis  Participation Level:  Active   Description of Group:   This group will allow patients to explore their thoughts and feelings about diagnoses they have received. Patients will be guided to explore their level of understanding and acceptance of these diagnoses. Facilitator will encourage patients to process their thoughts and feelings about the reactions of others to their diagnosis and will guide patients in identifying ways to discuss their diagnosis with significant others in their lives. This group will be process-oriented, with patients participating in exploration of their own experiences, giving and receiving support, and processing challenge from other group members.   Therapeutic Goals: 1. Patient will demonstrate understanding of diagnosis as evidenced by identifying two or more symptoms of the disorder 2. Patient will be able to express two feelings regarding the diagnosis 3. Patient will demonstrate their ability to communicate their needs through discussion and/or role play  Summary of Patient Progress: Actively and appropriately engaged in the group. Mcihael says "I need to be honest with myself and be sure to take my medications because they help." Patient was able to provide support and validation to other group members. Patient practiced active listening when interacting with the facilitator and other group members Patient in still in the process of obtaining treatment goals.        Therapeutic Modalities:   Cognitive Behavioral Therapy Brief Therapy Feelings Identification    Darin Engels, LCSW 01/05/2018 1:56 PM

## 2018-01-05 NOTE — Progress Notes (Signed)
Patient ID: [name]., male   DOB: 07/19/1951, 67 y.o.   MRN: [id number] Isolative to room but very pleasant on approach, deep voice, questionable HOH; enjoy talking, curious to know more about others, evasively answered my question by "What brought me here? The usual thing that brings people here..." Significant tremors validated during medication administration as he was unable to hold a cup of water and medication cup even when he tries to use both hands. Complained of indigestion and received Maalox with relief on reassessment.

## 2018-01-05 NOTE — Plan of Care (Signed)
Patient slept for Estimated Hours of 4; Pain addressed, Short term and Long term Memory Intact; Precautionary checks every 15 minutes for safety maintained, room free of safety hazards, patient sustains no injury or falls during this shift.  Problem: Education: Goal: Ability to state activities that reduce stress will improve Outcome: Progressing   Problem: Coping: Goal: Ability to identify and develop effective coping behavior will improve Outcome: Progressing   Problem: Self-Concept: Goal: Ability to identify factors that promote anxiety will improve Outcome: Progressing Goal: Level of anxiety will decrease Outcome: Progressing Goal: Ability to modify response to factors that promote anxiety will improve Outcome: Progressing

## 2018-01-05 NOTE — [facility] Group Notes (Signed)
  01/05/2018  Time: 0900  Type of Therapy and Topic: Group Therapy: Goals Group: SMART Goals   Participation Level:  Did Not Attend   Description of Group:   The purpose of a daily goals group is to assist and guide patients in setting recovery/wellness-related goals. The objective is to set goals as they relate to the crisis in which they were admitted. Patients will be using SMART goal modalities to set measurable goals. Characteristics of realistic goals will be discussed and patients will be assisted in setting and processing how one will reach their goal. Facilitator will also assist patients in applying interventions and coping skills learned in psycho-education groups to the SMART goal and process how one will achieve defined goal.   Therapeutic Goals:  -Patients will develop and document one goal related to or their crisis in which brought them into treatment.  -Patients will be guided by LCSW using SMART goal setting modality in how to set a measurable, attainable, realistic and time sensitive goal.  -Patients will process barriers in reaching goal.  -Patients will process interventions in how to overcome and successful in reaching goal.   Patient's Goal:  Pt was invited to attend group but chose not to attend. CSW will continue to encourage pt to attend group throughout their admission.   Therapeutic Modalities:  Motivational Interviewing  Cognitive Behavioral Therapy  Crisis Intervention Model  SMART goals setting  Alden Hipp, MSW, LCSW Clinical Social Worker 01/05/2018 9:38 AM

## 2018-01-06 DIAGNOSIS — R4189 Other symptoms and signs involving cognitive functions and awareness: Secondary | ICD-10-CM

## 2018-01-06 MED ORDER — DONEPEZIL HCL 5 MG PO TABS
10.0000 mg | ORAL_TABLET | ORAL | Status: DC
  Administered 2018-01-07 – 2018-01-14 (×8): 10 mg via ORAL
  Filled 2018-01-06 (×6): qty 2
  Filled 2018-01-06: qty 1
  Filled 2018-01-06 (×3): qty 2

## 2018-01-06 MED ORDER — LORAZEPAM 0.5 MG PO TABS
0.5000 mg | ORAL_TABLET | Freq: Two times a day (BID) | ORAL | Status: DC
  Administered 2018-01-06 – 2018-01-11 (×10): 0.5 mg via ORAL
  Filled 2018-01-06 (×10): qty 1

## 2018-01-06 MED ORDER — QUETIAPINE FUMARATE ER 200 MG PO TB24
400.0000 mg | ORAL_TABLET | Freq: Every day | ORAL | Status: DC
  Administered 2018-01-06: 400 mg via ORAL
  Filled 2018-01-06: qty 2

## 2018-01-06 MED ORDER — QUETIAPINE FUMARATE ER 300 MG PO TB24
500.0000 mg | ORAL_TABLET | Freq: Every day | ORAL | Status: DC
  Filled 2018-01-06: qty 1

## 2018-01-06 NOTE — Progress Notes (Signed)
Patient's goal for the day is to "get into a routine." Patient is complaining about not sleeping since he is in a different setting and is not used to his surroundings. Depression rated as 1, anxiety rated as 0. Says he is not worried about placement after hospital discharge. Patient is heard from the hallway talking to himself and having conversations. Patient denies SI, HI, auditory and visual hallucinations. Patient stays in bed except for when it is time to eat. When patient was asked if he was going to attend group today, he said his socks were too tight and he needs shoes. Bigger socks were given to patient and support to attend group and encouragement was provided. Patient requested to speak with MD and CSW sometime today. Pleasant and cooperative. Compliant with medications, but would not walk to med room; meds given in patient room. Denies pain.   Patient safety maintained with safety checks Q 15 minutes.

## 2018-01-06 NOTE — Plan of Care (Addendum)
Patient found in bed sleeping upon my arrival. Patient neither visible nor social this evening. Cooperative with assessment. Denies all complaints. Denies SI/HI/AVH. Patient is minimally verbal upon waking but pleasant and cooperative. Reports eating and voiding adequately. Compliant with HS medications and staff direction. Q 15 minute checks maintained. Will continue to monitor throughout the shift. Patient slept 7.5 hours. No apparent distress. Will endorse care to oncoming shift.  Problem: Coping: Goal: Ability to identify and develop effective coping behavior will improve Outcome: Progressing   Problem: Elimination: Goal: Will not experience complications related to bowel motility Outcome: Progressing

## 2018-01-06 NOTE — BHH Group Notes (Signed)
LCSW Group Therapy Note  01/06/2018 1:00 pm  Type of Therapy/Topic:  Group Therapy:  Emotion Regulation  Participation Level:  Did Not Attend   Description of Group:    The purpose of this group is to assist patients in learning to regulate negative emotions and experience positive emotions. Patients will be guided to discuss ways in which they have been vulnerable to their negative emotions. These vulnerabilities will be juxtaposed with experiences of positive emotions or situations, and patients will be challenged to use positive emotions to combat negative ones. Special emphasis will be placed on coping with negative emotions in conflict situations, and patients will process healthy conflict resolution skills.  Therapeutic Goals: 1. Patient will identify two positive emotions or experiences to reflect on in order to balance out negative emotions 2. Patient will label two or more emotions that they find the most difficult to experience 3. Patient will demonstrate positive conflict resolution skills through discussion and/or role plays  Summary of Patient Progress:  Joseph Holt was invited to today's group, but chose not to attend.     Therapeutic Modalities:   Cognitive Behavioral Therapy Feelings Identification Dialectical Behavioral Therapy

## 2018-01-06 NOTE — Progress Notes (Signed)
Patient's gait has become unsteady since the morning. Patient walked with nurse from room to lunch. Claims he has never used a Medical laboratory scientific officer. Precautions initiated by informing staff of fall precaution and yellow arm band placed on patient. Safety maintained with 15 minute checks.

## 2018-01-06 NOTE — BHH Suicide Risk Assessment (Signed)
[place] INPATIENT:  Family/Significant Other Suicide Prevention Education  Suicide Prevention Education:  Education Completed; Nance Pear ([phone] Fax: [phone] ) the owner of [facility] has been identified by the patient as the family member/significant other with whom the patient will be residing, and identified as the person(s) who will aid the patient in the event of a mental health crisis (suicidal ideations/suicide attempt).  With written consent from the patient, the family member/significant other has been provided the following suicide prevention education, prior to the and/or following the discharge of the patient.  The suicide prevention education provided includes the following:  Suicide risk factors  Suicide prevention and interventions  National Suicide Hotline telephone number  [facility] assessment telephone number  [facility] Emergency Assistance [place] and/or Residential Mobile Crisis Unit telephone number  Request made of family/significant other to:  Remove weapons (e.g., guns, rifles, knives), all items previously/currently identified as safety concern.    Remove drugs/medications (over-the-counter, prescriptions, illicit drugs), all items previously/currently identified as a safety concern.  The family member/significant other verbalizes understanding of the suicide prevention education information provided.  The family member/significant other agrees to remove the items of safety concern listed above.  Elberta Fortis states that the patient can return back to the group home. He understands that the patient has these "episodes" at times. He plans to come and speak with the patient today at 4:30pm to reassure him that he can return. No guns/weapons reported.   Darin Engels 01/06/2018, 9:57 AM

## 2018-01-06 NOTE — Plan of Care (Addendum)
Patient found in bed awake in the dark upon my arrival. Patient is isolative to his room except when he came out for medication and snack. Patient is neither visible nor social with peers but cooperative and pleasant with staff. Describes his mood as "better." Continues to state he will be here until he finds placement in a different group home. Insinuates that his last group home was not treating him well. Denies pain. Denies depression and anxiety. Denies SI/HI/AVH. Compliant with HS medications and staff direction. Given Trazodone for sleep with positive results. Q 15 minute checks maintained. Will continue to monitor throughout the shift. Patient slept 6 hours. No apparent distress. Compliant with am VS. VSS. Will endorse care to oncoming shift.  Problem: Self-Concept: Goal: Level of anxiety will decrease Outcome: Progressing   Problem: Elimination: Goal: Will not experience complications related to bowel motility Outcome: Progressing

## 2018-01-06 NOTE — Plan of Care (Deleted)
Patient states level of anxiety is 0. Education provided and support given.

## 2018-01-06 NOTE — Progress Notes (Addendum)
[facility] MD Progress Note  01/06/2018 11:38 AM [place].  MRN:  [id number]   Subjective:  Pt states that he is feeling "queasy" today. He did eat breakfast and states that his appetite is improving. He is a bit more disorganized today compared to yesterday. HE is still very adamant that he does not want to return to his care home. HE does not give any reasons to why other than "I wore out my stay. He is married and has kids." He states that if he discharges back there then he will "stand on the highway and walk to [place]." He states that he is trying to catch up on rest today. HE has been very calm on the unit. Denies SI, HI, AH, VH.   Principal Problem: Paranoid schizophrenia ([place]) Diagnosis:   Patient Active Problem List   Diagnosis Date Noted  . Tobacco use disorder [F17.200] 01/02/2018  . Dementia [F03.90]   . Paranoid schizophrenia ([place]) [F20.0] 02/21/2015  . Social maladjustment [Z60.9] 02/21/2015   Total Time spent with patient: 20 minutes  Past Psychiatric History: See H&P  Past Medical History:  Past Medical History:  Diagnosis Date  . Bipolar 1 disorder ([place])   . Schizo affective schizophrenia [facility])     Past Surgical History:  Procedure Laterality Date  . APPENDECTOMY    . HERNIA REPAIR     Family History: History reviewed. No pertinent family history. Family Psychiatric  History: See H&P Social History:  Social History   Substance and Sexual Activity  Alcohol Use No     Social History   Substance and Sexual Activity  Drug Use No    Social History   Socioeconomic History  . Marital status: Single    Spouse name: Not on file  . Number of children: Not on file  . Years of education: Not on file  . Highest education level: Not on file  Occupational History  . Not on file  Social Needs  . Financial resource strain: Not on file  . Food insecurity:    Worry: Not on file    Inability: Not on file  . Transportation needs:    Medical: Not on file     Non-medical: Not on file  Tobacco Use  . Smoking status: Current Every Day Smoker    Packs/day: 0.20    Types: Cigarettes  . Smokeless tobacco: Never Used  . Tobacco comment: pt smokes 1 cigarette a day  Substance and Sexual Activity  . Alcohol use: No  . Drug use: No  . Sexual activity: Not on file  Lifestyle  . Physical activity:    Days per week: Not on file    Minutes per session: Not on file  . Stress: Not on file  Relationships  . Social connections:    Talks on phone: Not on file    Gets together: Not on file    Attends religious service: Not on file    Active member of club or organization: Not on file    Attends meetings of clubs or organizations: Not on file    Relationship status: Not on file  Other Topics Concern  . Not on file  Social History Narrative  . Not on file   Additional Social History:    Pain Medications: Please see MAR Prescriptions: Please see MAR Over the Counter: Please see MAR History of alcohol / drug use?: No history of alcohol / drug abuse Longest period of sobriety (when/how long): N/A  Sleep: Fair  Appetite:  Fair  Current Medications: Current Facility-Administered Medications  Medication Dose Route Frequency Provider Last Rate Last Dose  . acetaminophen (TYLENOL) tablet 650 mg  650 mg Oral Q6H PRN Pucilowska, Jolanta B, MD      . alum & mag hydroxide-simeth (MAALOX/MYLANTA) 200-200-20 MG/5ML suspension 30 mL  30 mL Oral Q4H PRN Pucilowska, Jolanta B, MD   30 mL at 01/04/18 2109  . amantadine (SYMMETREL) capsule 100 mg  100 mg Oral BID Marylin Crosby, MD   100 mg at 01/06/18 0815  . chlorproMAZINE (THORAZINE) tablet 25 mg  25 mg Oral TID Pucilowska, Jolanta B, MD   25 mg at 01/06/18 0816  . divalproex (DEPAKOTE) DR tablet 500 mg  500 mg Oral Q12H Delores Edelstein, Tyson Babinski, MD   500 mg at 01/06/18 0815  . [START ON 01/07/2018] donepezil (ARICEPT) tablet 10 mg  10 mg Oral BH-q7a Aman Bonet, Tyson Babinski, MD      . feeding  supplement (ENSURE ENLIVE) (ENSURE ENLIVE) liquid 237 mL  237 mL Oral BID BM Ethleen Lormand R, MD      . gabapentin (NEURONTIN) capsule 400 mg  400 mg Oral Daily Jaspreet Hollings R, MD   400 mg at 01/06/18 0815  . hydrOXYzine (ATARAX/VISTARIL) tablet 25 mg  25 mg Oral TID PRN Pucilowska, Jolanta B, MD      . LORazepam (ATIVAN) tablet 1 mg  1 mg Oral BID Aber[place] Hafen, Tyson Babinski, MD   1 mg at 01/06/18 0815  . magnesium hydroxide (MILK OF MAGNESIA) suspension 30 mL  30 mL Oral Daily PRN Pucilowska, Jolanta B, MD      . memantine (NAMENDA XR) 24 hr capsule 28 mg  28 mg Oral Daily Jamiyah Dingley, Tyson Babinski, MD   28 mg at 01/06/18 0815  . mirtazapine (REMERON) tablet 30 mg  30 mg Oral QHS Marializ Ferrebee, Tyson Babinski, MD   30 mg at 01/05/18 2106  . nicotine (NICODERM CQ - dosed in mg/24 hours) patch 21 mg  21 mg Transdermal Q0600 Pucilowska, Jolanta B, MD   21 mg at 01/06/18 0818  . pantoprazole (PROTONIX) EC tablet 40 mg  40 mg Oral Daily Laquilla Dault, Tyson Babinski, MD   40 mg at 01/06/18 0815  . QUEtiapine (SEROQUEL XR) 24 hr tablet 500 mg  500 mg Oral QHS Etha Stambaugh R, MD      . simvastatin (ZOCOR) tablet 20 mg  20 mg Oral q1800 Pucilowska, Jolanta B, MD   20 mg at 01/05/18 2126  . tamsulosin (FLOMAX) capsule 0.4 mg  0.4 mg Oral Daily Pucilowska, Jolanta B, MD   0.4 mg at 01/06/18 0815  . traZODone (DESYREL) tablet 100 mg  100 mg Oral QHS PRN Pucilowska, Jolanta B, MD   100 mg at 01/05/18 2106    Lab Results: No results found for this or any previous visit (from the past 48 hour(s)).  Blood Alcohol level:  Lab Results  Component Value Date   ETH <10 01/01/2018   ETH <5 19/37/9024    Metabolic Disorder Labs: Lab Results  Component Value Date   HGBA1C 5.3 01/01/2018   MPG 105.41 01/01/2018   No results found for: PROLACTIN Lab Results  Component Value Date   CHOL 94 01/01/2018   TRIG 65 01/01/2018   HDL 26 (L) 01/01/2018   CHOLHDL 3.6 01/01/2018   VLDL 13 01/01/2018   LDLCALC 55 01/01/2018    Physical Findings: AIMS: Facial and  Oral Movements Muscles of Facial Expression: None, normal  Lips and Perioral Area: None, normal Jaw: None, normal Tongue: None, normal,Extremity Movements Upper (arms, wrists, hands, fingers): None, normal Lower (legs, knees, ankles, toes): None, normal, Trunk Movements Neck, shoulders, hips: None, normal, Overall Severity Severity of abnormal movements (highest score from questions above): None, normal Incapacitation due to abnormal movements: None, normal Patient's awareness of abnormal movements (rate only patient's report): No Awareness, Dental Status Current problems with teeth and/or dentures?: No Does patient usually wear dentures?: No  CIWA:  CIWA-Ar Total: 3 COWS:     Musculoskeletal: Strength & Muscle Tone: within normal limits Gait & Station: normal Patient leans: N/A  Psychiatric Specialty Exam: Physical Exam  Nursing note and vitals reviewed.   Review of Systems  All other systems reviewed and are negative.   Blood pressure 100/65, pulse (!) 105, temperature 98 F (36.7 C), temperature source Oral, resp. rate 20, height 6\' 1"  (1.854 m), weight 64 kg (141 lb), SpO2 97 %.Body mass index is 18.6 kg/m.  General Appearance: Casual  Eye Contact:  Good  Speech:  Clear and Coherent  Volume:  Normal  Mood:  Euthymic  Affect:  Congruent  Thought Process:  Coherent and Disorganized  Orientation:  Full (Time, Place, and Person)  Thought Content:  Illogical  Suicidal Thoughts:  No  Homicidal Thoughts:  No  Memory:  Immediate;   Poor  Judgement:  Impaired  Insight:  Lacking  Psychomotor Activity:  Normal  Concentration:  Concentration: Poor  Recall:  [employer] of Knowledge:  Fair  Language:  Fair  Akathisia:  No      Assets:  Resilience  ADL's:  Intact  Cognition:  Impaired,  Mild  Sleep:  Number of Hours: 6     Treatment Plan Summary: 67 yo male admitted due to running away from group home. He does have history of cognitive impairment. He is a little more  confused and disorganized today but able to redirect. HE is still adamant that he wants to go to another care home. The group home owner is coming today to speak with patient as he is welcome to return there but patient states that he feels like a burden. Hopefully, this will help reassure patient to want to go back there as they know him very well. Otherwise, CSW will look into other group home options. Pt is caring well for his ADLs and has good hygiene.   Plan:  Schizophrenia -Continue thorazine 25 mg TID -Continue amantadine 100 mg BID -Continue Seroquel XR 400 mg qhs. Consider increasing dose. Will watch BP.  -Continue Depakote 500 mg BID. Check level tomorrow -Decrease Ativan to 0.5 mg BID due to cognitive impairment. Plan to taper off completely -Remeron 30 mg qhs  Cognitive impairment -Aricept 10 mg and Namenda 28 mg  Will check UA to r/o UTI, check Depakote level tomorrow and CMP, also will check B12, folate, RPR  Dispo -Pt will meet with group home owner today to try to reassure patient about returning there. He is his own guardian  Marylin Crosby, MD 01/06/2018, 11:38 AM

## 2018-01-06 NOTE — Progress Notes (Signed)
Patient ID: Joseph Curb., male   DOB: July 03, 1951, 67 y.o.   MRN: [id number] PER STATE REGULATIONS 482.30  THIS CHART WAS REVIEWED FOR MEDICAL NECESSITY WITH RESPECT TO THE PATIENT'S ADMISSION/ DURATION OF STAY.  NEXT REVIEW DATE: 01/10/18  Chauncy Lean, RN, BSN CASE MANAGER

## 2018-01-06 NOTE — NC FL2 (Addendum)
[place] LEVEL OF CARE SCREENING TOOL     IDENTIFICATION  Patient Name: Joseph Holt. Birthdate: [birth year] Sex: male Admission Date (Current Location): 01/02/2018  [place] and [place] Number:  Engineering geologist and Address:  [facility], [address] [place], [place] [address]      Provider Number: [id number]  Attending Physician Name and Address:  Marylin Crosby, MD  Relative Name and Phone Number:  Paxon Propes- Brother- [phone]    Current Level of Care: Hospital Recommended Level of Care: [facility] Prior Approval Number:    Date Approved/Denied:   PASRR Number:    Discharge Plan: Domiciliary (Rest home)    Current Diagnoses: Patient Active Problem List   Diagnosis Date Noted  . Tobacco use disorder 01/02/2018  . Dementia   . Paranoid schizophrenia ([place]) 02/21/2015  . Social maladjustment 02/21/2015    Orientation RESPIRATION BLADDER Height & Weight     Self, Time, Situation, Place  Normal Continent Weight: 141 lb (64 kg) Height:  6\' 1"  (185.4 cm)  BEHAVIORAL SYMPTOMS/MOOD NEUROLOGICAL BOWEL NUTRITION STATUS  (N/A) (N/A) Continent Diet(Regualr)  AMBULATORY STATUS COMMUNICATION OF NEEDS Skin   Independent Verbally Normal                       Personal Care Assistance Level of Assistance  Bathing, Feeding, Dressing, Total care Bathing Assistance: Independent Feeding assistance: Independent Dressing Assistance: Independent Total Care Assistance: Independent   Functional Limitations Info  Sight, Hearing, Speech Sight Info: Adequate Hearing Info: Adequate Speech Info: Adequate    SPECIAL CARE FACTORS FREQUENCY  Mild Cognitive Impairment                    Contractures Contractures Info: Not present    Additional Factors Info  Psychotropic     Psychotropic Info: Psycotropic medications         Current Medications (01/06/2018):  This is the current hospital active  medication list Current Facility-Administered Medications  Medication Dose Route Frequency Provider Last Rate Last Dose  . acetaminophen (TYLENOL) tablet 650 mg  650 mg Oral Q6H PRN Pucilowska, Jolanta B, MD      . alum & mag hydroxide-simeth (MAALOX/MYLANTA) 200-200-20 MG/5ML suspension 30 mL  30 mL Oral Q4H PRN Pucilowska, Jolanta B, MD   30 mL at 01/04/18 2109  . amantadine (SYMMETREL) capsule 100 mg  100 mg Oral BID Marylin Crosby, MD   100 mg at 01/06/18 0815  . chlorproMAZINE (THORAZINE) tablet 25 mg  25 mg Oral TID Pucilowska, Jolanta B, MD   25 mg at 01/06/18 1158  . divalproex (DEPAKOTE) DR tablet 500 mg  500 mg Oral Q12H McNew, Tyson Babinski, MD   500 mg at 01/06/18 0815  . [START ON 01/07/2018] donepezil (ARICEPT) tablet 10 mg  10 mg Oral BH-q7a McNew, Tyson Babinski, MD      . feeding supplement (ENSURE ENLIVE) (ENSURE ENLIVE) liquid 237 mL  237 mL Oral BID BM McNew, Holly R, MD      . gabapentin (NEURONTIN) capsule 400 mg  400 mg Oral Daily McNew, Holly R, MD   400 mg at 01/06/18 0815  . hydrOXYzine (ATARAX/VISTARIL) tablet 25 mg  25 mg Oral TID PRN Pucilowska, Jolanta B, MD      . LORazepam (ATIVAN) tablet 0.5 mg  0.5 mg Oral BID McNew, Holly R, MD      . magnesium hydroxide (MILK OF MAGNESIA) suspension 30  mL  30 mL Oral Daily PRN Pucilowska, Jolanta B, MD      . memantine (NAMENDA XR) 24 hr capsule 28 mg  28 mg Oral Daily McNew, Tyson Babinski, MD   28 mg at 01/06/18 0815  . mirtazapine (REMERON) tablet 30 mg  30 mg Oral QHS McNew, Tyson Babinski, MD   30 mg at 01/05/18 2106  . nicotine (NICODERM CQ - dosed in mg/24 hours) patch 21 mg  21 mg Transdermal Q0600 Pucilowska, Jolanta B, MD   21 mg at 01/06/18 0818  . pantoprazole (PROTONIX) EC tablet 40 mg  40 mg Oral Daily McNew, Tyson Babinski, MD   40 mg at 01/06/18 0815  . QUEtiapine (SEROQUEL XR) 24 hr tablet 400 mg  400 mg Oral QHS McNew, Holly R, MD      . simvastatin (ZOCOR) tablet 20 mg  20 mg Oral q1800 Pucilowska, Jolanta B, MD   20 mg at 01/05/18 2126  .  tamsulosin (FLOMAX) capsule 0.4 mg  0.4 mg Oral Daily Pucilowska, Jolanta B, MD   0.4 mg at 01/06/18 0815     Discharge Medications: Please see discharge summary for a list of discharge medications.  Relevant Imaging Results:  Relevant Lab Results:   Additional Information N/A  Darin Engels, LCSW

## 2018-01-06 NOTE — BHH Counselor (Signed)
CSW spoke with Nance Pear (Phone:[phone] Fax: [phone] ) the owner of [facility] about discharge planning. Elberta Fortis says that he will come and speak with the patient today at 4:30pm to discuss him coming back to the group home and reassuring the patient that he can return back there. CSW will inform patients care team.  Darin Engels, MSW, [place], [place] 01/06/2018 9:56 AM

## 2018-01-06 NOTE — BHH Counselor (Signed)
CSW called the following Family Care homes to inquire about male beds for the patient:  . [place] (6) [place]; [place], [place] [address] [phone] Fax: [phone] [id number]- No beds available  . [place] (6) [place], [facility] [address] [place], [place] [phone] Fax: [phone] Mechanic referred to Aliene Beams- [phone]- CSW left a voicemail . [facility] and [facility] (6) [place] [facility] and [facility]. [address]; [place], [place] [address] [phone] Fax: [phone]-  Faxed over [place] . [facility] (5) Stella B. [facility] [address]; [place], [place] [address] [phone] Fax: [phone] FCL-[id number]- They said no.  [place] [facility] (6) A. Toy Baker- [address]; [place], [place] [address] [phone] Fax: [phone] FCL-[id number], [phone], [place] [facility] or Elvis Coil- Number busy . [facility] (6) [place]; [place], [place] [address] [phone] Fax: [phone] FCL-[id number]- No male beds available.  . [facility] (5) [facility] [place]; [place], [place] [address] [phone] Fax: ( ) - [phone]- Number not working . [facility] (4) Olegario Messier [address] [place], [place] [address] [phone] Fax: [phone]- Number not working . [facility] (6) [place]; [place], [place] [address] [phone] Fax: [phone]- CSW left a voicemail . [place] [facility] (3) [place]; [place], [place] [address] [phone] Fax: [phone] FCL-[id number]- Number not working . [place] (6) Feliciana Rossetti [address] [place], [place] [address] [phone] Fax: [phone]. - CSW left a voicemail . [facility] (6) [facility] [place] [address] [place], [place] [address] [phone] Fax: [phone] FCL-[id number] Schwartz, No male beds available . [facility] (6) [place] [address], [place] [address] [phone] Fax: [phone] - Not at this place, call Lewanda Rife- [place] down his name and will be coming to visit him, faxed over [facility]- [phone],   Darin Engels, MSW, [place], LCASA 01/06/2018 1:45 PM

## 2018-01-06 NOTE — Progress Notes (Signed)
Recreation Therapy Notes   Date: 01/06/2018  Time: 9:30 am  Location: Craft Room  Behavioral response: N/A  Intervention Topic: Coping Skills  Discussion/Intervention: Patient did not attend group.   Clinical Observations/Feedback:  Patient did not attend group. Cythia Bachtel LRT/CTRS            [place] Mielke 01/06/2018 12:36 PM

## 2018-01-06 NOTE — BHH Counselor (Signed)
CSW called the following [facility] to inquire about male beds for the patient:  . [facility] (6) Toney Sang [address] [place], [place] [address] [phone] Fax: [phone] [id number]- No male beds available. [facility] (4) Berenice Bouton [address]; [place], [place] [address] [phone] Fax: [phone]- No working number. . [place] (6) Mertis C. Wilson [address]; [place], [place] [address] [phone] Fax: [phone] [id number], No male beds available . [facility] (6) Jannette Fogo [address] [place], [place] [address] [phone] Fax: [phone]-  [place] [address] . [place] (6) [place] [phone] [address]; [place], [place] [address] [phone] Fax: [phone]- Not a working number.  Marguarite Arbour (6) [address]; [place], [place] [address] [phone] Fax: [phone] -No male beds available . Modesta Messing-   [phone]- CSW left a voicemail . [place] (6) Clara S. Vicente Males and [address] [place], [place] [address] [phone] Fax: [phone] [id number] CSW left a voicemail  Darin Engels, MSW, [place], [place] 01/06/2018 2:41 PM

## 2018-01-07 LAB — FOLATE: Folate: 10.2 ng/mL (ref >5.9)

## 2018-01-07 LAB — VALPROIC ACID LEVEL: Valproic Acid Lvl: 55 ug/mL (ref 50.0–100.0)

## 2018-01-07 LAB — URINALYSIS, COMPLETE (UACMP) WITH MICROSCOPIC
BILIRUBIN URINE: NEGATIVE
Bacteria, UA: NONE SEEN
Glucose, UA: NEGATIVE mg/dL
Hgb urine dipstick: NEGATIVE
KETONES UR: 5 mg/dL — AB
LEUKOCYTES UA: NEGATIVE
NITRITE: NEGATIVE
PH: 7 (ref 5.0–8.0)
Protein, ur: NEGATIVE mg/dL
RBC / HPF: NONE SEEN RBC/hpf (ref 0–5)
Specific Gravity, Urine: 1.014 (ref 1.005–1.030)
Squamous Epithelial / LPF: NONE SEEN

## 2018-01-07 LAB — COMPREHENSIVE METABOLIC PANEL
ALBUMIN: 3.8 g/dL (ref 3.5–5.0)
ALK PHOS: 76 U/L (ref 38–126)
ALT: 24 U/L (ref 17–63)
ANION GAP: 10 (ref 5–15)
AST: 29 U/L (ref 15–41)
BUN: 24 mg/dL — ABNORMAL HIGH (ref 6–20)
CO2: 24 mmol/L (ref 22–32)
CREATININE: 1.27 mg/dL — AB (ref 0.61–1.24)
Calcium: 8.9 mg/dL (ref 8.9–10.3)
Chloride: 101 mmol/L (ref 101–111)
GFR calc non Af Amer: 57 mL/min — ABNORMAL LOW (ref >60)
GLUCOSE: 100 mg/dL — AB (ref 65–99)
Potassium: 4.4 mmol/L (ref 3.5–5.1)
Sodium: 135 mmol/L (ref 135–145)
TOTAL PROTEIN: 7.4 g/dL (ref 6.5–8.1)
Total Bilirubin: 0.4 mg/dL (ref 0.3–1.2)

## 2018-01-07 LAB — VITAMIN B12: Vitamin B-12: 357 pg/mL (ref 180–914)

## 2018-01-07 MED ORDER — TUBERCULIN PPD 5 UNIT/0.1ML ID SOLN
5.0000 [IU] | Freq: Once | INTRADERMAL | Status: AC
  Administered 2018-01-07: 5 [IU] via INTRADERMAL
  Filled 2018-01-07: qty 0.1

## 2018-01-07 MED ORDER — QUETIAPINE FUMARATE 200 MG PO TABS
300.0000 mg | ORAL_TABLET | Freq: Every day | ORAL | Status: DC
  Administered 2018-01-07: 300 mg via ORAL
  Filled 2018-01-07: qty 1

## 2018-01-07 MED ORDER — QUETIAPINE FUMARATE ER 300 MG PO TB24
300.0000 mg | ORAL_TABLET | Freq: Every day | ORAL | Status: DC
  Filled 2018-01-07: qty 1

## 2018-01-07 NOTE — Plan of Care (Addendum)
Patient found in bed, dressed and lying in the dark. Patient is neither visible nor social this evening. Continues to isolate himself from peers. Denies SI/HI/AVH. No bizarre behavior noted. Reports he is hopeful about the new group home he interviewed with today. Reports that he will get $66 spending money per month. Reports he has not been given spending money for "almost 20 years." Patient is adamant that he does not want to return to current group home and had refused to speak with them today. Denies depression and anxiety. Reports eating and voiding adequately. Denies pain. Compliant with HS medications and staff direction. Q 15 minute checks maintained. Will continue to monitor throughout the shift.  Patient slept 6.5 hours. No apparent distress. Will endorse care to oncoming shift.   Problem: Education: Goal: Ability to state activities that reduce stress will improve Outcome: Progressing   Problem: Coping: Goal: Ability to identify and develop effective coping behavior will improve Outcome: Progressing   Problem: Elimination: Goal: Will not experience complications related to bowel motility Outcome: Progressing

## 2018-01-07 NOTE — Progress Notes (Signed)
[place] rounding on unit. CH provided silent prayer outside patients room. Patient did not answer when the [facility] knocked.

## 2018-01-07 NOTE — Progress Notes (Addendum)
Patient ate breakfast and went straight back to his room. Refused to walk by the medication room in order for medications to be brought to his room. Cooperative with taking medications in room. Patient's goal for the day is to "smooth his bones." When asked what that meant, patient said "to make my bones not so rough." Patient insisted on social worker calling [facility] to ask about admission. Still refuses to return to old group home.Patient complains of not getting enough rest. Support given. Patient also refused to have blood drawn before breakfast; voiced concerns of vein collapsing and shakiness in arm. Nurse suggested trying again after lunch or in the evening. Patient denies SI/HI/AVH. Safety maintained with checks Q 15 minutes.

## 2018-01-07 NOTE — BHH Group Notes (Signed)
LCSW Group Therapy Note 01/07/2018 9:00 AM  Type of Therapy and Topic:  Group Therapy:  Setting Goals  Participation Level:  Did Not Attend  Description of Group: In this process group, patients discussed using strengths to work toward goals and address challenges.  Patients identified two positive things about themselves and one goal they were working on.  Patients were given the opportunity to share openly and support each other's plan for self-empowerment.  The group discussed the value of gratitude and were encouraged to have a daily reflection of positive characteristics or circumstances.  Patients were encouraged to identify a plan to utilize their strengths to work on current challenges and goals.  Therapeutic Goals 1. Patient will verbalize personal strengths/positive qualities and relate how these can assist with achieving desired personal goals 2. Patients will verbalize affirmation of peers plans for personal change and goal setting 3. Patients will explore the value of gratitude and positive focus as related to successful achievement of goals 4. Patients will verbalize a plan for regular reinforcement of personal positive qualities and circumstances.  Summary of Patient Progress:  Joseph Holt was invited to today's group, but chose not to attend.     Therapeutic Modalities Cognitive Behavioral Therapy Motivational Interviewing    Joseph Holt, [name] 01/07/2018 10:24 AM

## 2018-01-07 NOTE — BHH Group Notes (Signed)
[place] Group Notes:  (Nursing/MHT/Case Management/Adjunct)  Date:  01/07/2018  Time:  9:38 PM  Type of Therapy:  Group Therapy  Participation Level:  Did Not Attend   Nehemiah Settle 01/07/2018, 9:38 PM

## 2018-01-07 NOTE — Progress Notes (Signed)
Recreation Therapy Notes  Date: 01/07/2018  Time: 9:30 am  Location: Craft Room  Behavioral response: N/A  Intervention Topic: Team Work  Discussion/Intervention: Patient did not attend group.   Clinical Observations/Feedback:  Patient did not attend group.   Doni Widmer LRT/CTRS          Linnie Delgrande 01/07/2018 11:51 AM

## 2018-01-07 NOTE — BHH Group Notes (Signed)
01/07/2018  Time: 1PM  Type of Therapy/Topic:  Group Therapy:  Balance in Life  Participation Level:  Did Not Attend  Description of Group:   This group will address the concept of balance and how it feels and looks when one is unbalanced. Patients will be encouraged to process areas in their lives that are out of balance and identify reasons for remaining unbalanced. Facilitators will guide patients in utilizing problem-solving interventions to address and correct the stressor making their life unbalanced. Understanding and applying boundaries will be explored and addressed for obtaining and maintaining a balanced life. Patients will be encouraged to explore ways to assertively make their unbalanced needs known to significant others in their lives, using other group members and facilitator for support and feedback.  Therapeutic Goals: 1. Patient will identify two or more emotions or situations they have that consume much of in their lives. 2. Patient will identify signs/triggers that life has become out of balance:  3. Patient will identify two ways to set boundaries in order to achieve balance in their lives:  4. Patient will demonstrate ability to communicate their needs through discussion and/or role plays  Summary of Patient Progress: Pt was invited to attend group but chose not to attend. CSW will continue to encourage pt to attend group throughout their admission.    Therapeutic Modalities:   Cognitive Behavioral Therapy Solution-Focused Therapy Assertiveness Training  Alden Hipp, MSW, LCSW Clinical Social Worker 01/07/2018 1:52 PM

## 2018-01-07 NOTE — BHH Counselor (Signed)
CSW spoke with Ann Lions from [facility] regarding the patients discharge plans. She provided the CSW with a new [place] contact who has male beds available- Antionio Lennox Grumbles [phone]. CSW called Mr.Miles to inquire about available beds for the patient. CSW faxed over the [facility] and scheduled an appointment for today for Mr. Lennox Grumbles to interview the patient in the hospital. Mr. Lennox Grumbles will be here today between 4:30-5pm.   CSW called Nance Pear from the patients old [facility] to check and see how his visit went yesterday afternoon. Mr. Laurance Flatten was unable to see the patient yesterday. He scheduled to come and see the patient today at 3pm.   CSW informed the patient, his nurse and his psychiatrist about both visits today.   Darin Engels, MSW, [place], LCASA 01/07/2018 11:12 AM

## 2018-01-07 NOTE — Progress Notes (Addendum)
[facility] MD Progress Note  01/07/2018 1:12 PM [place].  MRN:  [id number] Subjective:  Pt states that he feeling okay today. He ate a great lunch and is feeling full. HE reports a good mood. He is sleeping okay. He is overall organized but does make some bizzare comments at times but redirects well. He is fully oriented. HE is taking care of his ADLs and hygiene well. He was able to shave and is going to file his toe nails tonight. He denies SI, HI, AH, VH. He is excited to have an interview with a new care home this afternoon and asks for advice so it goes well. He denies feeling dizzy at this time but when he stands up he feels dizzy. He has been sitting on the side of the bed for a few minutes before standing.   Principal Problem: Paranoid schizophrenia ([place]) Diagnosis:   Patient Active Problem List   Diagnosis Date Noted  . Paranoid schizophrenia ([place]) [F20.0] 02/21/2015    Priority: High  . Cognitive impairment [R41.89] 01/06/2018  . Tobacco use disorder [F17.200] 01/02/2018  . Social maladjustment [Z60.9] 02/21/2015   Total Time spent with patient: 20 minutes  Past Psychiatric History: See H&P  Past Medical History:  Past Medical History:  Diagnosis Date  . Bipolar 1 disorder ([place])   . Schizo affective schizophrenia [facility])     Past Surgical History:  Procedure Laterality Date  . APPENDECTOMY    . HERNIA REPAIR     Family History: History reviewed. No pertinent family history. Family Psychiatric  History: See H&P Social History:  Social History   Substance and Sexual Activity  Alcohol Use No     Social History   Substance and Sexual Activity  Drug Use No    Social History   Socioeconomic History  . Marital status: Single    Spouse name: Not on file  . Number of children: Not on file  . Years of education: Not on file  . Highest education level: Not on file  Occupational History  . Not on file  Social Needs  . Financial resource strain: Not on file  .  Food insecurity:    Worry: Not on file    Inability: Not on file  . Transportation needs:    Medical: Not on file    Non-medical: Not on file  Tobacco Use  . Smoking status: Current Every Day Smoker    Packs/day: 0.20    Types: Cigarettes  . Smokeless tobacco: Never Used  . Tobacco comment: pt smokes 1 cigarette a day  Substance and Sexual Activity  . Alcohol use: No  . Drug use: No  . Sexual activity: Not on file  Lifestyle  . Physical activity:    Days per week: Not on file    Minutes per session: Not on file  . Stress: Not on file  Relationships  . Social connections:    Talks on phone: Not on file    Gets together: Not on file    Attends religious service: Not on file    Active member of club or organization: Not on file    Attends meetings of clubs or organizations: Not on file    Relationship status: Not on file  Other Topics Concern  . Not on file  Social History Narrative  . Not on file   Additional Social History:    Pain Medications: Please see MAR Prescriptions: Please see MAR Over the Counter: Please see MAR History  of alcohol / drug use?: No history of alcohol / drug abuse Longest period of sobriety (when/how long): N/A                    Sleep: Good  Appetite:  Good  Current Medications: Current Facility-Administered Medications  Medication Dose Route Frequency Provider Last Rate Last Dose  . acetaminophen (TYLENOL) tablet 650 mg  650 mg Oral Q6H PRN Pucilowska, Jolanta B, MD      . alum & mag hydroxide-simeth (MAALOX/MYLANTA) 200-200-20 MG/5ML suspension 30 mL  30 mL Oral Q4H PRN Pucilowska, Jolanta B, MD   30 mL at 01/04/18 2109  . amantadine (SYMMETREL) capsule 100 mg  100 mg Oral BID Marylin Crosby, MD   100 mg at 01/07/18 0825  . chlorproMAZINE (THORAZINE) tablet 25 mg  25 mg Oral TID Pucilowska, Jolanta B, MD   25 mg at 01/07/18 1205  . divalproex (DEPAKOTE) DR tablet 500 mg  500 mg Oral Q12H Gary Bultman, Tyson Babinski, MD   500 mg at 01/07/18  0825  . donepezil (ARICEPT) tablet 10 mg  10 mg Oral Redmond School, MD   10 mg at 01/07/18 [phone]  . feeding supplement (ENSURE ENLIVE) (ENSURE ENLIVE) liquid 237 mL  237 mL Oral BID BM Brinda Focht R, MD   237 mL at 01/07/18 1000  . gabapentin (NEURONTIN) capsule 400 mg  400 mg Oral Daily Yaacov Koziol R, MD   400 mg at 01/07/18 0165  . hydrOXYzine (ATARAX/VISTARIL) tablet 25 mg  25 mg Oral TID PRN Pucilowska, Jolanta B, MD      . LORazepam (ATIVAN) tablet 0.5 mg  0.5 mg Oral BID Nanako Stopher, Tyson Babinski, MD   0.5 mg at 01/07/18 0826  . magnesium hydroxide (MILK OF MAGNESIA) suspension 30 mL  30 mL Oral Daily PRN Pucilowska, Jolanta B, MD      . memantine (NAMENDA XR) 24 hr capsule 28 mg  28 mg Oral Daily Raetta Agostinelli, Tyson Babinski, MD   28 mg at 01/07/18 0826  . mirtazapine (REMERON) tablet 30 mg  30 mg Oral QHS Jullian Previti R, MD   30 mg at 01/06/18 2116  . nicotine (NICODERM CQ - dosed in mg/24 hours) patch 21 mg  21 mg Transdermal Q0600 Pucilowska, Jolanta B, MD   21 mg at 01/07/18 0640  . pantoprazole (PROTONIX) EC tablet 40 mg  40 mg Oral Daily Kennet Mccort, Tyson Babinski, MD   40 mg at 01/07/18 0825  . QUEtiapine (SEROQUEL XR) 24 hr tablet 300 mg  300 mg Oral QHS Cosandra Plouffe R, MD      . QUEtiapine (SEROQUEL) tablet 300 mg  300 mg Oral QHS Rece Zechman R, MD      . simvastatin (ZOCOR) tablet 20 mg  20 mg Oral q1800 Pucilowska, Jolanta B, MD   20 mg at 01/06/18 1721  . tamsulosin (FLOMAX) capsule 0.4 mg  0.4 mg Oral Daily Pucilowska, Jolanta B, MD   0.4 mg at 01/07/18 0825    Lab Results:  Results for orders placed or performed during the hospital encounter of 01/02/18 (from the past 48 hour(s))  Valproic acid level     Status: None   Collection Time: 01/07/18 11:06 AM  Result Value Ref Range   Valproic Acid Lvl 55 50.0 - 100.0 ug/mL    Comment: Performed at [facility], [address], [place], [place] [address]  Comprehensive metabolic panel     Status: Abnormal   Collection Time: 01/07/18  11:06 AM   Result Value Ref Range   Sodium 135 135 - 145 mmol/L   Potassium 4.4 3.5 - 5.1 mmol/L   Chloride 101 101 - 111 mmol/L   CO2 24 22 - 32 mmol/L   Glucose, Bld 100 (H) 65 - 99 mg/dL   BUN 24 (H) 6 - 20 mg/dL   Creatinine, Ser 1.27 (H) 0.61 - 1.24 mg/dL   Calcium 8.9 8.9 - 10.3 mg/dL   Total Protein 7.4 6.5 - 8.1 g/dL   Albumin 3.8 3.5 - 5.0 g/dL   AST 29 15 - 41 U/L   ALT 24 17 - 63 U/L   Alkaline Phosphatase 76 38 - 126 U/L   Total Bilirubin 0.4 0.3 - 1.2 mg/dL   GFR calc non Af Amer 57 (L) >60 mL/min   GFR calc Af Amer >60 >60 mL/min    Comment: (NOTE) The eGFR has been calculated using the CKD EPI equation. This calculation has not been validated in all clinical situations. eGFR's persistently <60 mL/min signify possible Chronic Kidney Disease.    Anion gap 10 5 - 15    Comment: Performed at [facility], [address] [place], [place] [address]  Folate     Status: None   Collection Time: 01/07/18 11:06 AM  Result Value Ref Range   Folate 10.2 >5.9 ng/mL    Comment: Performed at [facility], [address] [place],  [address]    Blood Alcohol level:  Lab Results  Component Value Date   [facility] <10 01/01/2018   ETH <5 76/72/0947    Metabolic Disorder Labs: Lab Results  Component Value Date   HGBA1C 5.3 01/01/2018   MPG 105.41 01/01/2018   No results found for: PROLACTIN Lab Results  Component Value Date   CHOL 94 01/01/2018   TRIG 65 01/01/2018   HDL 26 (L) 01/01/2018   CHOLHDL 3.6 01/01/2018   VLDL 13 01/01/2018   LDLCALC 55 01/01/2018    Physical Findings: AIMS: Facial and Oral Movements Muscles of Facial Expression: None, normal Lips and Perioral Area: None, normal Jaw: None, normal Tongue: None, normal,Extremity Movements Upper (arms, wrists, hands, fingers): None, normal Lower (legs, knees, ankles, toes): None, normal, Trunk Movements Neck, shoulders, hips: None, normal, Overall Severity Severity of abnormal movements  (highest score from questions above): None, normal Incapacitation due to abnormal movements: None, normal Patient's awareness of abnormal movements (rate only patient's report): No Awareness, Dental Status Current problems with teeth and/or dentures?: No Does patient usually wear dentures?: No  CIWA:  CIWA-Ar Total: 2 COWS:     Musculoskeletal: Strength & Muscle Tone: within normal limits Gait & Station: normal Patient leans: N/A  Psychiatric Specialty Exam: Physical Exam  Nursing note and vitals reviewed.   Review of Systems  All other systems reviewed and are negative.   Blood pressure (!) 124/109, pulse (!) 117, temperature 98 F (36.7 C), temperature source Oral, resp. rate 20, height _0  (1.854 m), weight 64 kg (141 lb), SpO2 96 %.Body mass index is 18.6 kg/m.  General Appearance: Casual  Eye Contact:  Good  Speech:  Clear and Coherent  Volume:  Normal  Mood:  Euthymic  Affect:  Congruent  Thought Process:  Coherent and Goal Directed  Orientation:  Full (Time, Place, and Person)  Thought Content:  Logical  Suicidal Thoughts:  No  Homicidal Thoughts:  No  Memory:  Immediate;   Fair  Judgement:  Impaired  Insight:  Lacking  Psychomotor Activity:  Normal  Concentration:  Concentration: Fair  Recall:  [employer] of Knowledge:  Fair  Language:  Fair  Akathisia:  No      Assets:  Resilience  ADL's:  Intact  Cognition:  Mild cognitive impairment  Sleep:  Number of Hours: 7.5     Treatment Plan Summary: 67 yo male admitted due to running away from group home. He has been very calm and pleasant on the unit. He is overall organized in conversation. He is fully oriented and caring for his ADL's well. He does have some orthostatic hypotension. Will try to adjust Seroquel to see if this improves.    Plan:  Schizophrenia -Continue thorazine 25 mg TID -Continue amantadine 100 mg BID -Decrease Seroquel to 300 mg qhs and switch to IR to see if this helps with  BP -Continue Depakote 500 mg BID. Level 55 -Continue lower dose of Ativan 0.5 mg BID. Goal will be to taper off completely -Continue Remeron 30 mg qhs  Cognitive impairment -Aricept 10 mg and Nameda 28 mg  UA has not been collected yet, B12 pending, Folate normal, RPR pending  Dispo -He will meet with a group home owner today to look into acceptance into new group home. Will order PPD   Marylin Crosby, MD 01/07/2018, 1:12 PM

## 2018-01-08 LAB — RPR: RPR: NONREACTIVE

## 2018-01-08 MED ORDER — QUETIAPINE FUMARATE ER 300 MG PO TB24
300.0000 mg | ORAL_TABLET | Freq: Every day | ORAL | Status: DC
  Administered 2018-01-08 – 2018-01-10 (×3): 300 mg via ORAL
  Filled 2018-01-08 (×4): qty 1

## 2018-01-08 NOTE — BHH Counselor (Signed)
Butch Penny and Calton Golds from [facility] - [phone] or [phone] will be coming to interview Joseph Holt tomorrow for a possible family care home placement. She says that she will be here before 1pm to complete the interview. CSW told her I'd check back in with her on Monday to see how things went.   Darin Engels, MSW, [place], LCASA 01/08/2018 3:40 PM

## 2018-01-08 NOTE — Progress Notes (Signed)
[facility] MD Progress Note  01/08/2018 10:06 AM [place].  MRN:  [id number]   Subjective:  Pt is in his room this morning resting. He states that his interview with the group home went very well and is hoping he gets accepted. He is excited taht he will get 66 dollars of spending money a month. HE liked the group home owners and feel they will be a good fit for him. He feels less dizzy this morning. He is fully oriented and organized in conversation. He is feeling tired this morning. He did eat breakfast and appetite is good.   Principal Problem: Paranoid schizophrenia ([place]) Diagnosis:   Patient Active Problem List   Diagnosis Date Noted  . Paranoid schizophrenia ([place]) [F20.0] 02/21/2015    Priority: High  . Cognitive impairment [R41.89] 01/06/2018  . Tobacco use disorder [F17.200] 01/02/2018  . Social maladjustment [Z60.9] 02/21/2015   Total Time spent with patient: 20 minutes  Past Psychiatric History: See H&P  Past Medical History:  Past Medical History:  Diagnosis Date  . Bipolar 1 disorder ([place])   . Schizo affective schizophrenia [facility])     Past Surgical History:  Procedure Laterality Date  . APPENDECTOMY    . HERNIA REPAIR     Family History: History reviewed. No pertinent family history. Family Psychiatric  History: See H&P Social History:  Social History   Substance and Sexual Activity  Alcohol Use No     Social History   Substance and Sexual Activity  Drug Use No    Social History   Socioeconomic History  . Marital status: Single    Spouse name: Not on file  . Number of children: Not on file  . Years of education: Not on file  . Highest education level: Not on file  Occupational History  . Not on file  Social Needs  . Financial resource strain: Not on file  . Food insecurity:    Worry: Not on file    Inability: Not on file  . Transportation needs:    Medical: Not on file    Non-medical: Not on file  Tobacco Use  . Smoking status: Current  Every Day Smoker    Packs/day: 0.20    Types: Cigarettes  . Smokeless tobacco: Never Used  . Tobacco comment: pt smokes 1 cigarette a day  Substance and Sexual Activity  . Alcohol use: No  . Drug use: No  . Sexual activity: Not on file  Lifestyle  . Physical activity:    Days per week: Not on file    Minutes per session: Not on file  . Stress: Not on file  Relationships  . Social connections:    Talks on phone: Not on file    Gets together: Not on file    Attends religious service: Not on file    Active member of club or organization: Not on file    Attends meetings of clubs or organizations: Not on file    Relationship status: Not on file  Other Topics Concern  . Not on file  Social History Narrative  . Not on file   Additional Social History:    Pain Medications: Please see MAR Prescriptions: Please see MAR Over the Counter: Please see MAR History of alcohol / drug use?: No history of alcohol / drug abuse Longest period of sobriety (when/how long): N/A                    Sleep: Fair  Appetite:  Good  Current Medications: Current Facility-Administered Medications  Medication Dose Route Frequency Provider Last Rate Last Dose  . acetaminophen (TYLENOL) tablet 650 mg  650 mg Oral Q6H PRN Pucilowska, Jolanta B, MD      . alum & mag hydroxide-simeth (MAALOX/MYLANTA) 200-200-20 MG/5ML suspension 30 mL  30 mL Oral Q4H PRN Pucilowska, Jolanta B, MD   30 mL at 01/04/18 2109  . amantadine (SYMMETREL) capsule 100 mg  100 mg Oral BID Marylin Crosby, MD   100 mg at 01/08/18 0850  . chlorproMAZINE (THORAZINE) tablet 25 mg  25 mg Oral TID Pucilowska, Jolanta B, MD   25 mg at 01/08/18 0850  . divalproex (DEPAKOTE) DR tablet 500 mg  500 mg Oral Q12H Cliffie Gingras, Tyson Babinski, MD   500 mg at 01/08/18 0850  . donepezil (ARICEPT) tablet 10 mg  10 mg Oral Redmond School, MD   10 mg at 01/07/18 [phone]  . feeding supplement (ENSURE ENLIVE) (ENSURE ENLIVE) liquid 237 mL  237 mL Oral BID  BM Rivan Siordia, Tyson Babinski, MD   237 mL at 01/07/18 1833  . gabapentin (NEURONTIN) capsule 400 mg  400 mg Oral Daily Bhargav Barbaro, Tyson Babinski, MD   400 mg at 01/08/18 0850  . hydrOXYzine (ATARAX/VISTARIL) tablet 25 mg  25 mg Oral TID PRN Pucilowska, Jolanta B, MD      . LORazepam (ATIVAN) tablet 0.5 mg  0.5 mg Oral BID Bernadean Saling, Tyson Babinski, MD   0.5 mg at 01/08/18 0850  . magnesium hydroxide (MILK OF MAGNESIA) suspension 30 mL  30 mL Oral Daily PRN Pucilowska, Jolanta B, MD      . memantine (NAMENDA XR) 24 hr capsule 28 mg  28 mg Oral Daily Halie Gass, Tyson Babinski, MD   28 mg at 01/08/18 0850  . mirtazapine (REMERON) tablet 30 mg  30 mg Oral QHS Lido Maske, Tyson Babinski, MD   30 mg at 01/07/18 2118  . nicotine (NICODERM CQ - dosed in mg/24 hours) patch 21 mg  21 mg Transdermal Q0600 Pucilowska, Jolanta B, MD   21 mg at 01/08/18 0608  . pantoprazole (PROTONIX) EC tablet 40 mg  40 mg Oral Daily Athalie Newhard, Tyson Babinski, MD   40 mg at 01/08/18 0850  . QUEtiapine (SEROQUEL) tablet 300 mg  300 mg Oral QHS Sabrinia Prien, Tyson Babinski, MD   300 mg at 01/07/18 2119  . simvastatin (ZOCOR) tablet 20 mg  20 mg Oral q1800 Pucilowska, Jolanta B, MD   20 mg at 01/07/18 1825  . tamsulosin (FLOMAX) capsule 0.4 mg  0.4 mg Oral Daily Pucilowska, Jolanta B, MD   0.4 mg at 01/08/18 0850  . tuberculin injection 5 Units  5 Units Intradermal Once Marylin Crosby, MD   5 Units at 01/07/18 1517    Lab Results:  Results for orders placed or performed during the hospital encounter of 01/02/18 (from the past 48 hour(s))  Urinalysis, Complete w Microscopic     Status: Abnormal   Collection Time: 01/06/18  1:35 PM  Result Value Ref Range   Color, Urine YELLOW (A) YELLOW   APPearance CLEAR (A) CLEAR   Specific Gravity, Urine 1.014 1.005 - 1.030   pH 7.0 5.0 - 8.0   Glucose, UA NEGATIVE NEGATIVE mg/dL   Hgb urine dipstick NEGATIVE NEGATIVE   Bilirubin Urine NEGATIVE NEGATIVE   Ketones, ur 5 (A) NEGATIVE mg/dL   Protein, ur NEGATIVE NEGATIVE mg/dL   Nitrite NEGATIVE NEGATIVE    Leukocytes, UA NEGATIVE NEGATIVE  RBC / HPF NONE SEEN 0 - 5 RBC/hpf   WBC, UA 0-5 0 - 5 WBC/hpf   Bacteria, UA NONE SEEN NONE SEEN   Squamous Epithelial / LPF NONE SEEN NONE SEEN    Comment: Performed at [facility], [address] [place], [place] [address]  Valproic acid level     Status: None   Collection Time: 01/07/18 11:06 AM  Result Value Ref Range   Valproic Acid Lvl 55 50.0 - 100.0 ug/mL    Comment: Performed at [facility], [address] [place], [place] [address]  Comprehensive metabolic panel     Status: Abnormal   Collection Time: 01/07/18 11:06 AM  Result Value Ref Range   Sodium 135 135 - 145 mmol/L   Potassium 4.4 3.5 - 5.1 mmol/L   Chloride 101 101 - 111 mmol/L   CO2 24 22 - 32 mmol/L   Glucose, Bld 100 (H) 65 - 99 mg/dL   BUN 24 (H) 6 - 20 mg/dL   Creatinine, Ser 1.27 (H) 0.61 - 1.24 mg/dL   Calcium 8.9 8.9 - 10.3 mg/dL   Total Protein 7.4 6.5 - 8.1 g/dL   Albumin 3.8 3.5 - 5.0 g/dL   AST 29 15 - 41 U/L   ALT 24 17 - 63 U/L   Alkaline Phosphatase 76 38 - 126 U/L   Total Bilirubin 0.4 0.3 - 1.2 mg/dL   GFR calc non Af Amer 57 (L) >60 mL/min   GFR calc Af Amer >60 >60 mL/min    Comment: (NOTE) The eGFR has been calculated using the CKD EPI equation. This calculation has not been validated in all clinical situations. eGFR's persistently <60 mL/min signify possible Chronic Kidney Disease.    Anion gap 10 5 - 15    Comment: Performed at [facility], [address] [place], [place] [address]  Vitamin B12     Status: None   Collection Time: 01/07/18 11:06 AM  Result Value Ref Range   Vitamin B-12 357 180 - 914 pg/mL    Comment: (NOTE) This assay is not validated for testing neonatal or myeloproliferative syndrome specimens for Vitamin B12 levels. Performed at [facility], [place] [address], [place], [place] [address]   Folate     Status: None   Collection Time: 01/07/18 11:06 AM  Result Value Ref Range   Folate  10.2 >5.9 ng/mL    Comment: Performed at [facility], [address] [place], [place] [address]  RPR     Status: None   Collection Time: 01/07/18 11:06 AM  Result Value Ref Range   RPR Ser Ql Non Reactive Non Reactive    Comment: (NOTE) Performed At: [facility] [place], [place] [id number] Rush Farmer MD PP:[id number] Performed at [facility], [address] [place], [place], [place] [address]     Blood Alcohol level:  Lab Results  Component Value Date   [facility] <10 01/01/2018   ETH <5 60/07/9322    Metabolic Disorder Labs: Lab Results  Component Value Date   HGBA1C 5.3 01/01/2018   MPG 105.41 01/01/2018   No results found for: PROLACTIN Lab Results  Component Value Date   CHOL 94 01/01/2018   TRIG 65 01/01/2018   HDL 26 (L) 01/01/2018   CHOLHDL 3.6 01/01/2018   VLDL 13 01/01/2018   LDLCALC 55 01/01/2018    Physical Findings: AIMS: Facial and Oral Movements Muscles of Facial Expression: None, normal Lips and Perioral Area:  None, normal Jaw: None, normal Tongue: None, normal,Extremity Movements Upper (arms, wrists, hands, fingers): None, normal Lower (legs, knees, ankles, toes): None, normal, Trunk Movements Neck, shoulders, hips: None, normal, Overall Severity Severity of abnormal movements (highest score from questions above): None, normal Incapacitation due to abnormal movements: None, normal Patient's awareness of abnormal movements (rate only patient's report): No Awareness, Dental Status Current problems with teeth and/or dentures?: No Does patient usually wear dentures?: No  CIWA:  CIWA-Ar Total: 2 COWS:     Musculoskeletal: Strength & Muscle Tone: within normal limits Gait & Station: normal Patient leans: N/A  Psychiatric Specialty Exam: Physical Exam  ROS  Blood pressure 92/64, pulse 93, temperature (!) 97.5 F (36.4 C), temperature source Oral, resp. rate 18, height _0  (1.854 m), weight 64 kg (141 lb),  SpO2 96 %.Body mass index is 18.6 kg/m.  General Appearance: Casual  Eye Contact:  Good  Speech:  Clear and Coherent  Volume:  Normal  Mood:  Euthymic  Affect:  Congruent  Thought Process:  Coherent and Goal Directed  Orientation:  Full (Time, Place, and Person)  Thought Content:  Logical  Suicidal Thoughts:  No  Homicidal Thoughts:  No  Memory:  Immediate;   Fair  Judgement:  Fair  Insight:  Lacking  Psychomotor Activity:  Normal  Concentration:  Concentration: Fair  Recall:  [employer] of Knowledge:  Fair  Language:  Fair  Akathisia:  No      Assets:  Resilience  ADL's:  Intact  Cognition:  Impaired,  Mild  Sleep:  Number of Hours: 6.5     Treatment Plan Summary: 66 yo male admitted due to running away from group home. HE has been very calm and very pleasant on the unit. He is organized in conversation. Hygiene is good. HE feels less dizzy this morning with decreased dose of Seroquel.   Plan:  Schizophrenia -Continue Thorazine 25 mg TID -Continue amantadine 100 mg BID for tremor -Continue lower dose of Seroquel 300 mg. Switch back to XR version -Continue Depakote 500 mg BID. Level 55 -Continue lower dose of Ativan 0.5 mg BID. Goal will be to taper off completely.  -Continue Remeron 34mg qhs  Cognitive impairment -Aricept and Namenda  UA negative for UTI, B12 normal, Folate and RPR normal  Dispo -He had interview with group home yesterday. CSW will follow up with this.   HMarylin Crosby MD 01/08/2018, 10:06 AM

## 2018-01-08 NOTE — Plan of Care (Signed)
Pt. Verbalizes he can remain safe while on the unit. Pt. Denies SI/HI. Contracts for safety.   Problem: Safety: Goal: Periods of time without injury will increase Outcome: Progressing

## 2018-01-08 NOTE — BHH Counselor (Signed)
CSW called the following Family Care homes to inquire about male beds for the patient:    . [facility] (6) [place]; [place], [place] [address] [phone] Fax: [phone] - Says that they won't be able to take him at the moment due to their acuity already.  [facility] [place]- Not set up for someone with dementia.  . [facility]-[phone]- [place]- Will send [place] over- Fax- [phone] . [facility] (5) [address] [place]; [place], [place] [address] [phone] Fax: [phone]- CSW left a voicemail.  [facility] C[facility]er at [facility] (6) [place]; [place], [place] [address] [phone] Fax: [phone]- Declined due to wandering.  [facility] (6) [place], [place] [address] [place], [place] [address] [phone] Fax: [phone]- Don't take [place] . [facility] (6) [address] [place] [address] [place], [place] [address] [phone] Fax: [phone]- [phone]- [facility] Bamberg or queen- CSW called and left a voicemail. Marland [facility] (6) [place]; [place], [place] [address] [phone] Fax: [phone]- Mailbox full, can't leave a message . [facility]- Phone- [phone]- (Mr. Annabell Howells [phone]).  Called Mr. Madelynn Done and left a voicemail with a call back number.   Darin Engels, MSW, [place], LCASA 01/08/2018 3:11 PM

## 2018-01-08 NOTE — BHH Group Notes (Signed)

## 2018-01-08 NOTE — Plan of Care (Signed)
Patient is pleasant and cooperative.  Denies SI/HI/AVH.  Verbalizes that he is tired and is going to catch up on his sleep.  Further stated that he did not want any visitors today because he wanted to sleep.  Appetite fair.  No inappropriate behavior noted.  Support and encouragement offered.  Safety rounds maintained.  Problem: Education: Goal: Ability to state activities that reduce stress will improve Outcome: Progressing   Problem: Coping: Goal: Ability to identify and develop effective coping behavior will improve Outcome: Progressing   Problem: Elimination: Goal: Will not experience complications related to bowel motility Outcome: Progressing

## 2018-01-08 NOTE — BHH Counselor (Signed)
CSW called Joseph Holt [phone]. CSW called Mr.Miles to follow up on the patients interview yesterday for placement at the [facility]. CSW will continue to follow up with [name].  CSW spoke to [facility]. He stated that they only have [facility] available at the [facility] and that the patients benefits are not enough to cover that amount. They don't have any double rooms available right now.     Darin Engels, MSW, [place], LCASA 01/08/2018 10:17 AM

## 2018-01-08 NOTE — Progress Notes (Signed)
Recreation Therapy Notes  Date: 01/08/2018  Time: 9:30 am  Location: Craft Room  Behavioral response: Appropriate  Intervention Topic: Leisure  Discussion/Intervention: Patient did not attend group. Clinical Observations/Feedback:  Patient did not attend group. Ladonya Jerkins LRT/CTRS          Barn[place] Shieh 01/08/2018 12:24 PM

## 2018-01-08 NOTE — BHH Counselor (Signed)
CSW called [facility] to inquire about the criteria for bed placement. CSW left a voicemail with both places with a callback number.  Darin Engels, MSW, [place], LCASA 01/08/2018 2:06 PM'

## 2018-01-08 NOTE — Progress Notes (Signed)
D:Pt denies SI/HI/AVH. Pt is pleasant and cooperative, but isolative this evening stating, "I just want to sleep". Pt. has no Complaints.  Patient Interaction minimal.  A: Q x 15 minute observation checks were completed for safety. Patient was provided with education. Patient was given scheduled medications. Patient  was encourage to attend groups, participate in unit activities and continue with plan of care.   R:Patient is complaint with medication and unit procedures. Does not attend group.              Precautionary checks every 15 minutes for safety maintained, room free of safety hazards, patient sustains no injury or falls during this shift.

## 2018-01-08 NOTE — Tx Team (Signed)
Interdisciplinary Treatment and Diagnostic Plan Update  01/08/2018 Time of Session: 11:00am Joseph Holt. MRN: [id number]  Principal Diagnosis: Paranoid schizophrenia ([place])  Secondary Diagnoses: Principal Problem:   Paranoid schizophrenia ([place]) Active Problems:   Cognitive impairment   Current Medications:  Current Facility-Administered Medications  Medication Dose Route Frequency Provider Last Rate Last Dose  . acetaminophen (TYLENOL) tablet 650 mg  650 mg Oral Q6H PRN Pucilowska, Jolanta B, MD      . alum & mag hydroxide-simeth (MAALOX/MYLANTA) 200-200-20 MG/5ML suspension 30 mL  30 mL Oral Q4H PRN Pucilowska, Jolanta B, MD   30 mL at 01/04/18 2109  . amantadine (SYMMETREL) capsule 100 mg  100 mg Oral BID Marylin Crosby, MD   100 mg at 01/08/18 0850  . chlorproMAZINE (THORAZINE) tablet 25 mg  25 mg Oral TID Pucilowska, Jolanta B, MD   25 mg at 01/08/18 0850  . divalproex (DEPAKOTE) DR tablet 500 mg  500 mg Oral Q12H McNew, Tyson Babinski, MD   500 mg at 01/08/18 0850  . donepezil (ARICEPT) tablet 10 mg  10 mg Oral [employer], MD   10 mg at 01/07/18 [phone]  . feeding supplement (ENSURE ENLIVE) (ENSURE ENLIVE) liquid 237 mL  237 mL Oral BID BM McNew, Tyson Babinski, MD   237 mL at 01/07/18 1833  . gabapentin (NEURONTIN) capsule 400 mg  400 mg Oral Daily McNew, Tyson Babinski, MD   400 mg at 01/08/18 0850  . hydrOXYzine (ATARAX/VISTARIL) tablet 25 mg  25 mg Oral TID PRN Pucilowska, Jolanta B, MD      . LORazepam (ATIVAN) tablet 0.5 mg  0.5 mg Oral BID McNew, Tyson Babinski, MD   0.5 mg at 01/08/18 0850  . magnesium hydroxide (MILK OF MAGNESIA) suspension 30 mL  30 mL Oral Daily PRN Pucilowska, Jolanta B, MD      . memantine (NAMENDA XR) 24 hr capsule 28 mg  28 mg Oral Daily McNew, Tyson Babinski, MD   28 mg at 01/08/18 0850  . mirtazapine (REMERON) tablet 30 mg  30 mg Oral QHS McNew, Tyson Babinski, MD   30 mg at 01/07/18 2118  . nicotine (NICODERM CQ - dosed in mg/24 hours) patch 21 mg  21 mg Transdermal Q0600  Pucilowska, Jolanta B, MD   21 mg at 01/08/18 0608  . pantoprazole (PROTONIX) EC tablet 40 mg  40 mg Oral Daily McNew, Tyson Babinski, MD   40 mg at 01/08/18 0850  . QUEtiapine (SEROQUEL XR) 24 hr tablet 300 mg  300 mg Oral QHS McNew, Holly R, MD      . simvastatin (ZOCOR) tablet 20 mg  20 mg Oral q1800 Pucilowska, Jolanta B, MD   20 mg at 01/07/18 1825  . tamsulosin (FLOMAX) capsule 0.4 mg  0.4 mg Oral Daily Pucilowska, Jolanta B, MD   0.4 mg at 01/08/18 0850  . tuberculin injection 5 Units  5 Units Intradermal Once Marylin Crosby, MD   5 Units at 01/07/18 1828   PTA Medications: Medications Prior to Admission  Medication Sig Dispense Refill Last Dose  . benztropine (COGENTIN) 0.5 MG tablet Take 0.5 mg by mouth 2 (two) times daily.   Taking  . cetirizine (ZYRTEC) 10 MG tablet Take 10 mg by mouth daily.   Taking  . chlorproMAZINE (THORAZINE) 25 MG tablet Take 25 mg by mouth 3 (three) times daily.   Taking  . Fluticasone-Salmeterol (ADVAIR) 250-50 MCG/DOSE AEPB Inhale 1 puff into the lungs 2 (two) times daily.  Taking  . gabapentin (NEURONTIN) 400 MG capsule Take 400 mg by mouth every morning.   Taking  . guaiFENesin (MUCINEX) 600 MG 12 hr tablet Take 600 mg by mouth 2 (two) times daily as needed.   Taking  . Icosapent Ethyl (VASCEPA) 1 g CAPS Take 2 capsules (2 g total) by mouth 2 (two) times daily. 180 capsule 1   . LORazepam (ATIVAN) 1 MG tablet Take 1 tablet (1 mg total) by mouth 2 (two) times daily. 180 tablet 1   . Memantine HCl-Donepezil HCl (NAMZARIC) 28-10 MG CP24 Take 1 capsule by mouth daily. 90 capsule 3   . mirtazapine (REMERON) 30 MG tablet Take 30 mg by mouth at bedtime.   Taking  . Nutritional Supplements (NUTRITIONAL SUPPLEMENT PO) Take 1 Can by mouth 3 (three) times daily.   Taking  . omeprazole (PRILOSEC) 20 MG capsule Take 20 mg by mouth every morning.   Taking  . promethazine (PHENERGAN) 25 MG tablet Take 25 mg by mouth every 6 (six) hours as needed for nausea or vomiting.    Taking  . Psyllium (METAMUCIL) WAFR Take 1 Wafer by mouth at bedtime.   Taking  . QUEtiapine (SEROQUEL) 400 MG tablet Take 400 mg by mouth 2 (two) times daily.    Taking  . simvastatin (ZOCOR) 20 MG tablet Take 20 mg by mouth at bedtime.   Taking  . tamsulosin (FLOMAX) 0.4 MG CAPS capsule Take 0.4 mg by mouth at bedtime.   Taking  . Vitamin D, Ergocalciferol, (DRISDOL) 50000 UNITS CAPS capsule Take 50,000 Units by mouth every 7 (seven) days.   Taking    Patient Stressors: Loss of communication with his brothers Other: Being homeless upon discharge  Patient Strengths: Ability for insight Active sense of humor Communication skills Religious Affiliation  Treatment Modalities: Medication Management, Group therapy, Case management,  1 to 1 session with clinician, Psychoeducation, Recreational therapy.   Physician Treatment Plan for Primary Diagnosis: Paranoid schizophrenia ([place]) Long Term Goal(s): Improvement in symptoms so as ready for discharge NA   Short Term Goals: Ability to identify changes in lifestyle to reduce recurrence of condition will improve Ability to verbalize feelings will improve Ability to disclose and discuss suicidal ideas Ability to demonstrate self-control will improve Ability to identify and develop effective coping behaviors will improve Ability to maintain clinical measurements within normal limits will improve Compliance with prescribed medications will improve Ability to identify triggers associated with substance abuse/mental health issues will improve NA  Medication Management: Evaluate patient's response, side effects, and tolerance of medication regimen.  Therapeutic Interventions: 1 to 1 sessions, Unit Group sessions and Medication administration.  Evaluation of Outcomes: Progressing  Physician Treatment Plan for Secondary Diagnosis: Principal Problem:   Paranoid schizophrenia ([place]) Active Problems:   Cognitive impairment  Long Term Goal(s):  Improvement in symptoms so as ready for discharge NA   Short Term Goals: Ability to identify changes in lifestyle to reduce recurrence of condition will improve Ability to verbalize feelings will improve Ability to disclose and discuss suicidal ideas Ability to demonstrate self-control will improve Ability to identify and develop effective coping behaviors will improve Ability to maintain clinical measurements within normal limits will improve Compliance with prescribed medications will improve Ability to identify triggers associated with substance abuse/mental health issues will improve NA     Medication Management: Evaluate patient's response, side effects, and tolerance of medication regimen.  Therapeutic Interventions: 1 to 1 sessions, Unit Group sessions and Medication administration.  Evaluation of Outcomes:  Progressing   RN Treatment Plan for Primary Diagnosis: Paranoid schizophrenia ([place]) Long Term Goal(s): Knowledge of disease and therapeutic regimen to maintain health will improve  Short Term Goals: Ability to identify and develop effective coping behaviors will improve and Compliance with prescribed medications will improve  Medication Management: RN will administer medications as ordered by provider, will assess and evaluate patient's response and provide education to patient for prescribed medication. RN will report any adverse and/or side effects to prescribing provider.  Therapeutic Interventions: 1 on 1 counseling sessions, Psychoeducation, Medication administration, Evaluate responses to treatment, Monitor vital signs and CBGs as ordered, Perform/monitor CIWA, COWS, AIMS and Fall Risk screenings as ordered, Perform wound care treatments as ordered.  Evaluation of Outcomes: Progressing   LCSW Treatment Plan for Primary Diagnosis: Paranoid schizophrenia ([place]) Long Term Goal(s): Safe transition to appropriate next level of care at discharge, Engage patient in  therapeutic group addressing interpersonal concerns.  Short Term Goals: Engage patient in aftercare planning with referrals and resources, Identify triggers associated with mental health/substance abuse issues and Increase skills for wellness and recovery  Therapeutic Interventions: Assess for all discharge needs, 1 to 1 time with Social worker, Explore available resources and support systems, Assess for adequacy in community support network, Educate family and significant other(s) on suicide prevention, Complete Psychosocial Assessment, Interpersonal group therapy.  Evaluation of Outcomes: Progressing   Progress in Treatment: Attending groups: Yes. Participating in groups: Yes. Taking medication as prescribed: Yes. Toleration medication: Yes. Family/Significant other contact made: Yes, individual(s) contacted:  Nance Pear (Phone:[phone]  Patient understands diagnosis: Yes. Discussing patient identified problems/goals with staff: Yes. Medical problems stabilized or resolved: Yes. Denies suicidal/homicidal ideation: Yes. Issues/concerns per patient self-inventory: No. Other:    New problem(s) identified: No, Describe:     New Short Term/Long Term Goal(s):"get meds straightened out"  Discharge Plan or Barriers: Discharge back home to group home and follow up with Dr[facility]Ward at [facility]  Reason for Continuation of Hospitalization: Delusions  [place] Medication stabilization  Estimated Length of Stay: 5-7 days  Attendees: Patient:  01/08/2018 11:22 AM  Physician: Amador Cunas, MD 01/08/2018 11:22 AM  Nursing:  01/08/2018 11:22 AM  RN Care Manager: 01/08/2018 11:22 AM  Social Worker: Darin Engels, [place] 01/08/2018 11:22 AM  Recreational Therapist:  01/08/2018 11:22 AM  Other: Alden Hipp, LCSW 01/08/2018 11:22 AM  Other: Derrek Gu, LCSW 01/08/2018 11:22 AM  Other: 01/08/2018 11:22 AM    Scribe for Treatment Team: Darin Engels, LCSW 01/08/2018 11:22 AM

## 2018-01-09 DIAGNOSIS — R4189 Other symptoms and signs involving cognitive functions and awareness: Secondary | ICD-10-CM

## 2018-01-09 NOTE — Progress Notes (Signed)
[facility] MD Progress Note  01/09/2018 6:29 PM Matthews.  MRN:  [id number]   Subjective:   The patient has been fairly isolative to his room throughout the daytime. He has come out for meals and did attend groups. Mood has been fairly stable and he denies any severe depressive symptoms or suicidal thoughts. No active psychosis. He does have some cognitive decline but is fully oriented and able to carry on a lucid conversation. He says his meeting with the group home went fairly well.  Principal Problem: Paranoid schizophrenia (Joseph Holt) Diagnosis:   Patient Active Problem List   Diagnosis Date Noted  . Cognitive impairment [R41.89] 01/06/2018  . Tobacco use disorder [F17.200] 01/02/2018  . Paranoid schizophrenia ([place]) [F20.0] 02/21/2015  . Social maladjustment [Z60.9] 02/21/2015   Total Time spent with patient: 20 minutes    Past Medical History:  Past Medical History:  Diagnosis Date  . Bipolar 1 disorder ([place])   . Schizo affective schizophrenia [facility])     Past Surgical History:  Procedure Laterality Date  . APPENDECTOMY    . HERNIA REPAIR      Social History:  Social History   Substance and Sexual Activity  Alcohol Use No     Social History   Substance and Sexual Activity  Drug Use No    Social History   Socioeconomic History  . Marital status: Single    Spouse name: Not on file  . Number of children: Not on file  . Years of education: Not on file  . Highest education level: Not on file  Occupational History  . Not on file  Social Needs  . Financial resource strain: Not on file  . Food insecurity:    Worry: Not on file    Inability: Not on file  . Transportation needs:    Medical: Not on file    Non-medical: Not on file  Tobacco Use  . Smoking status: Current Every Day Smoker    Packs/day: 0.20    Types: Cigarettes  . Smokeless tobacco: Never Used  . Tobacco comment: pt smokes 1 cigarette a day  Substance and Sexual Activity  . Alcohol use: No  .  Drug use: No  . Sexual activity: Not on file  Lifestyle  . Physical activity:    Days per week: Not on file    Minutes per session: Not on file  . Stress: Not on file  Relationships  . Social connections:    Talks on phone: Not on file    Gets together: Not on file    Attends religious service: Not on file    Active member of club or organization: Not on file    Attends meetings of clubs or organizations: Not on file    Relationship status: Not on file  Other Topics Concern  . Not on file  Social History Narrative  . Not on file   Additional Social History:    Pain Medications: Please see MAR Prescriptions: Please see MAR Over the Counter: Please see MAR History of alcohol / drug use?: No history of alcohol / drug abuse Longest period of sobriety (when/how long): N/A                    Sleep: Fair  Appetite:  Good  Current Medications: Current Facility-Administered Medications  Medication Dose Route Frequency Provider Last Rate Last Dose  . acetaminophen (TYLENOL) tablet 650 mg  650 mg Oral Q6H PRN Pucilowska, Jolanta B, MD      .  alum & mag hydroxide-simeth (MAALOX/MYLANTA) 200-200-20 MG/5ML suspension 30 mL  30 mL Oral Q4H PRN Pucilowska, Jolanta B, MD   30 mL at 01/04/18 2109  . amantadine (SYMMETREL) capsule 100 mg  100 mg Oral BID Marylin Crosby, MD   100 mg at 01/09/18 1720  . chlorproMAZINE (THORAZINE) tablet 25 mg  25 mg Oral TID Pucilowska, Jolanta B, MD   25 mg at 01/09/18 1720  . divalproex (DEPAKOTE) DR tablet 500 mg  500 mg Oral Q12H McNew, Tyson Babinski, MD   500 mg at 01/09/18 0817  . donepezil (ARICEPT) tablet 10 mg  10 mg Oral Roxy Manns, Tyson Babinski, MD   10 mg at 01/09/18 0604  . feeding supplement (ENSURE ENLIVE) (ENSURE ENLIVE) liquid 237 mL  237 mL Oral BID BM McNew, Holly R, MD   237 mL at 01/09/18 1047  . gabapentin (NEURONTIN) capsule 400 mg  400 mg Oral Daily McNew, Holly R, MD   400 mg at 01/09/18 0272  . hydrOXYzine (ATARAX/VISTARIL) tablet 25  mg  25 mg Oral TID PRN Pucilowska, Jolanta B, MD      . LORazepam (ATIVAN) tablet 0.5 mg  0.5 mg Oral BID McNew, Tyson Babinski, MD   0.5 mg at 01/09/18 1720  . magnesium hydroxide (MILK OF MAGNESIA) suspension 30 mL  30 mL Oral Daily PRN Pucilowska, Jolanta B, MD      . memantine (NAMENDA XR) 24 hr capsule 28 mg  28 mg Oral Daily McNew, Tyson Babinski, MD   28 mg at 01/09/18 1144  . mirtazapine (REMERON) tablet 30 mg  30 mg Oral QHS McNew, Tyson Babinski, MD   30 mg at 01/08/18 2103  . nicotine (NICODERM CQ - dosed in mg/24 hours) patch 21 mg  21 mg Transdermal Q0600 Pucilowska, Jolanta B, MD   21 mg at 01/09/18 0817  . pantoprazole (PROTONIX) EC tablet 40 mg  40 mg Oral Daily McNew, Tyson Babinski, MD   40 mg at 01/09/18 0817  . QUEtiapine (SEROQUEL XR) 24 hr tablet 300 mg  300 mg Oral QHS McNew, Tyson Babinski, MD   300 mg at 01/08/18 2103  . simvastatin (ZOCOR) tablet 20 mg  20 mg Oral q1800 Pucilowska, Jolanta B, MD   20 mg at 01/09/18 1721  . tamsulosin (FLOMAX) capsule 0.4 mg  0.4 mg Oral Daily Pucilowska, Jolanta B, MD   0.4 mg at 01/09/18 5366    Lab Results:  No results found for this or any previous visit (from the past 48 hour(s)).  Blood Alcohol level:  Lab Results  Component Value Date   ETH <10 01/01/2018   ETH <5 44/12/4740    Metabolic Disorder Labs: Lab Results  Component Value Date   HGBA1C 5.3 01/01/2018   MPG 105.41 01/01/2018   No results found for: PROLACTIN Lab Results  Component Value Date   CHOL 94 01/01/2018   TRIG 65 01/01/2018   HDL 26 (L) 01/01/2018   CHOLHDL 3.6 01/01/2018   VLDL 13 01/01/2018   LDLCALC 55 01/01/2018    Physical Findings: AIMS: Facial and Oral Movements Muscles of Facial Expression: None, normal Lips and Perioral Area: None, normal Jaw: None, normal Tongue: None, normal,Extremity Movements Upper (arms, wrists, hands, fingers): None, normal Lower (legs, knees, ankles, toes): None, normal, Trunk Movements Neck, shoulders, hips: None, normal, Overall  Severity Severity of abnormal movements (highest score from questions above): None, normal Incapacitation due to abnormal movements: None, normal Patient's awareness of abnormal movements (rate only patient's  report): No Awareness, Dental Status Current problems with teeth and/or dentures?: No Does patient usually wear dentures?: No  CIWA:  CIWA-Ar Total: 2 COWS:     Musculoskeletal: Strength & Muscle Tone: within normal limits Gait & Station: normal Patient leans: N/A  Psychiatric Specialty Exam: Physical Exam   Review of Systems  Constitutional: Negative.   HENT: Negative.   Eyes: Negative.   Respiratory: Negative.   Cardiovascular: Negative.   Gastrointestinal: Negative.   Genitourinary: Negative.   Musculoskeletal: Negative.   Skin: Negative.   Neurological: Negative.   Endo/Heme/Allergies: Negative.     Blood pressure (!) 95/59, pulse (!) 106, temperature 97.8 F (36.6 C), temperature source Oral, resp. rate 18, height 6\' 1"  (1.854 m), weight 64 kg (141 lb), SpO2 97 %.Body mass index is 18.6 kg/m.  General Appearance: Casual  Eye Contact:  Good  Speech:  Clear and Coherent  Volume:  Normal  Mood:  Euthymic  Affect:  Congruent  Thought Process:  Coherent and Goal Directed  Orientation:  Full (Time, Place, and Person)  Thought Content:  Logical  Suicidal Thoughts:  No  Homicidal Thoughts:  No  Memory:  Immediate;   Fair  Judgement:  Fair  Insight:  Lacking  Psychomotor Activity:  Normal  Concentration:  Concentration: Fair  Recall:  [employer] of Knowledge:  Fair  Language:  Fair  Akathisia:  No      Assets:  Resilience  ADL's:  Intact  Cognition:  Impaired,  Mild  Sleep:  Number of Hours: 7.15     Treatment Plan Summary: 67 yo male admitted due to running away from group home. HE has been very calm and very pleasant on the unit. He is organized in conversation. Hygiene is good. HE feels less dizzy this morning with decreased dose of Seroquel.    Plan:  Schizophrenia -Continue Thorazine 25 mg TID -Continue amantadine 100 mg BID for tremor -Continue lower dose of Seroquel XR 300 mg. Switch back to XR version -Continue Depakote 500 mg BID. VPA level of 55 -Continue lower dose of Ativan 0.5 mg BID. Goal will be to taper off completely.  -Continue Remeron 30mg  nightly which helps with insomnia and appetite -EKG showed a QTc of 462. Total cholesterol was 94 and hemoglobin A1c was 5.3  Cognitive impairment -continue Aricept and Namenda  UA negative for UTI, B12 normal, Folate and RPR normal  Dispo -He had interview with group home today and social work will followup with the group home Monday. He will need psychotropic medication management at the time of discharge.  Chauncey Mann, MD 01/09/2018, 6:29 PM

## 2018-01-09 NOTE — BHH Group Notes (Signed)
LCSW Group Therapy Note  01/09/2018 1:15pm  Type of Therapy and Topic:  Group Therapy:  Fears and Unhealthy Coping Skills  Participation Level:  Did Not Attend   Description of Group:  The focus of this group was to discuss some of the prevalent fears that patients experience, and to identify the commonalities among group members.  An exercise was used to initiate the discussion, followed by writing on the white board a group-generated list of unhealthy coping and healthy coping techniques to deal with each fear.    Therapeutic Goals: 1. Patient will identify and describe 3 fears they experience 2. Patient will identify one positive coping strategy for each fear they experience 3. Patient will respond empathically to peers statements regarding fears they experience  Summary of Patient Progress:  Pt invited to group but did not attend.      Therapeutic Modalities Cognitive Behavioral Therapy Motivational Interviewing  Lynze Reddy  CUEBAS-COLON, LCSW 01/09/2018 12:54 PM

## 2018-01-09 NOTE — Plan of Care (Signed)
Pleasant and cooperative. Denies SI/HI/AVH.  Isolates to room.  Up for meals and medication pass only.  No interaction with peers.  No initiation of conversations with staff.  Support and encouragement offered.  Safety maintained.     Problem: Education: Goal: Ability to state activities that reduce stress will improve Outcome: Progressing   Problem: Coping: Goal: Ability to identify and develop effective coping behavior will improve Outcome: Progressing   Problem: Elimination: Goal: Will not experience complications related to bowel motility Outcome: Progressing   Problem: Safety: Goal: Periods of time without injury will increase Outcome: Progressing

## 2018-01-10 LAB — BASIC METABOLIC PANEL
ANION GAP: 8 (ref 5–15)
BUN: 24 mg/dL — ABNORMAL HIGH (ref 6–20)
CALCIUM: 8.5 mg/dL — AB (ref 8.9–10.3)
CO2: 29 mmol/L (ref 22–32)
CREATININE: 1.19 mg/dL (ref 0.61–1.24)
Chloride: 100 mmol/L — ABNORMAL LOW (ref 101–111)
GFR calc Af Amer: >60 mL/min (ref >60)
GFR calc non Af Amer: >60 mL/min (ref >60)
GLUCOSE: 124 mg/dL — AB (ref 65–99)
Potassium: 4.2 mmol/L (ref 3.5–5.1)
Sodium: 137 mmol/L (ref 135–145)

## 2018-01-10 MED ORDER — VITAMIN B-12 1000 MCG PO TABS
1000.0000 ug | ORAL_TABLET | Freq: Every day | ORAL | Status: DC
  Administered 2018-01-10 – 2018-01-14 (×5): 1000 ug via ORAL
  Filled 2018-01-10 (×5): qty 1

## 2018-01-10 NOTE — Progress Notes (Signed)
D:Pt denies SI/HI/AVH. Pt is pleasant and cooperative, but isolative often still this evening stating, "still trying to catch up on my sleep". Pt.has no Complaints.Patient Interaction minimal, but more present in the milieu and during snack time.   A: Q x 15 minute observation checks were completed for safety. Patient was provided with education. Patient was given scheduled medications. Patient was encourage to attend groups, participate in unit activities and continue with plan of care.   R:Patient is complaint with medication and unit procedures. Does not attend group.              Precautionary checks every 15 minutes for safety maintained, room free of safety hazards, patient sustains no injury or falls during this shift.

## 2018-01-10 NOTE — Progress Notes (Signed)
Patient ID: Joseph Curb., male   DOB: January 22, 1951, 67 y.o.   MRN: [id number] Per State regulations 482.30 this chart was reviewed for medical necessity with respect to the patient's admission/duration of stay.    Next review date: 01/14/18  Debarah Crape, BSN, RN-BC  Case Manager

## 2018-01-10 NOTE — B[facility] Group Notes (Signed)
LCSW Group Therapy Note 01/10/2018 1:15pm  Type of Therapy and Topic: Group Therapy: Feelings Around Returning Home & Establishing a Supportive Framework and Supporting Oneself When Supports Not Available  Participation Level: Did Not Attend  Description of Group:  Patients first processed thoughts and feelings about upcoming discharge. These included fears of upcoming changes, lack of change, new living environments, judgements and expectations from others and overall stigma of mental health issues. The group then discussed the definition of a supportive framework, what that looks and feels like, and how do to discern it from an unhealthy non-supportive network. The group identified different types of supports as well as what to do when your family/friends are less than helpful or unavailable  Therapeutic Goals  1. Patient will identify one healthy supportive network that they can use at discharge. 2. Patient will identify one factor of a supportive framework and how to tell it from an unhealthy network. 3. Patient able to identify one coping skill to use when they do not have positive supports from others. 4. Patient will demonstrate ability to communicate their needs through discussion and/or role plays.  Summary of Patient Progress:  Patient was invited to group but did not attend.   Therapeutic Modalities Cognitive Behavioral Therapy Motivational Interviewing   Joseph Holt  CUEBAS-COLON, LCSW 01/10/2018 11:49 AM

## 2018-01-10 NOTE — Progress Notes (Signed)
Patient somewhat more irritable than normal, but still pleasant to Probation officer. Cooperative with medication pass. Patient refused SI/HI/AVH. Denies pain. Rates depression and anxiety both 0 out of 10. Patient's goal for the day was to "stay out of everybody's way." Isolates to room. Support and encouragement provided and safety maintained with checks Q 15 minutes.

## 2018-01-10 NOTE — Progress Notes (Signed)
[facility] MD Progress Note  01/10/2018 10:55 AM [place].  MRN:  [id number]   Subjective:   The patient is having some mild dizziness this morning. Blood pressure was also low.BMP pending. He denies any major mood swings or problems with worsening depression.Mood has been fairly stable and he denies any current active or passive suicidal thoughts or psychotic symptoms. No auditory or visual hallucinations. He did attend one of the groups yesterday for substance use but did not feel like it applied to him so he left. He was isolative to his room yesterday but has been out more today. He reports having had breakfast this morning. Appetite is fair.      Principal Problem: Paranoid schizophrenia ([place]) Diagnosis:   Patient Active Problem List   Diagnosis Date Noted  . Cognitive impairment [R41.89] 01/06/2018  . Tobacco use disorder [F17.200] 01/02/2018  . Paranoid schizophrenia ([place]) [F20.0] 02/21/2015  . Social maladjustment [Z60.9] 02/21/2015   Total Time spent with patient: 20 minutes    Past Medical History:  Past Medical History:  Diagnosis Date  . Bipolar 1 disorder ([place])   . Schizo affective schizophrenia [facility])     Past Surgical History:  Procedure Laterality Date  . APPENDECTOMY    . HERNIA REPAIR      Social History:  Social History   Substance and Sexual Activity  Alcohol Use No     Social History   Substance and Sexual Activity  Drug Use No    Social History   Socioeconomic History  . Marital status: Single    Spouse name: Not on file  . Number of children: Not on file  . Years of education: Not on file  . Highest education level: Not on file  Occupational History  . Not on file  Social Needs  . Financial resource strain: Not on file  . Food insecurity:    Worry: Not on file    Inability: Not on file  . Transportation needs:    Medical: Not on file    Non-medical: Not on file  Tobacco Use  . Smoking status: Current Every Day Smoker   Packs/day: 0.20    Types: Cigarettes  . Smokeless tobacco: Never Used  . Tobacco comment: pt smokes 1 cigarette a day  Substance and Sexual Activity  . Alcohol use: No  . Drug use: No  . Sexual activity: Not on file  Lifestyle  . Physical activity:    Days per week: Not on file    Minutes per session: Not on file  . Stress: Not on file  Relationships  . Social connections:    Talks on phone: Not on file    Gets together: Not on file    Attends religious service: Not on file    Active member of club or organization: Not on file    Attends meetings of clubs or organizations: Not on file    Relationship status: Not on file  Other Topics Concern  . Not on file  Social History Narrative  . Not on file   Additional Social History:    Pain Medications: Please see MAR Prescriptions: Please see MAR Over the Counter: Please see MAR History of alcohol / drug use?: No history of alcohol / drug abuse Longest period of sobriety (when/how long): N/A      Sleep: Good  Appetite:  Good  Current Medications: Current Facility-Administered Medications  Medication Dose Route Frequency Provider Last Rate Last Dose  . acetaminophen (TYLENOL) tablet  650 mg  650 mg Oral Q6H PRN Pucilowska, Jolanta B, MD      . alum & mag hydroxide-simeth (MAALOX/MYLANTA) 200-200-20 MG/5ML suspension 30 mL  30 mL Oral Q4H PRN Pucilowska, Jolanta B, MD   30 mL at 01/04/18 2109  . amantadine (SYMMETREL) capsule 100 mg  100 mg Oral BID Marylin Crosby, MD   100 mg at 01/10/18 8315  . chlorproMAZINE (THORAZINE) tablet 25 mg  25 mg Oral TID Pucilowska, Jolanta B, MD   25 mg at 01/10/18 1761  . divalproex (DEPAKOTE) DR tablet 500 mg  500 mg Oral Q12H McNew, Tyson Babinski, MD   500 mg at 01/10/18 6073  . donepezil (ARICEPT) tablet 10 mg  10 mg Oral Roxy Manns, Tyson Babinski, MD   10 mg at 01/10/18 7106  . feeding supplement (ENSURE ENLIVE) (ENSURE ENLIVE) liquid 237 mL  237 mL Oral BID BM McNew, Holly R, MD   237 mL at 01/09/18  1047  . gabapentin (NEURONTIN) capsule 400 mg  400 mg Oral Daily McNew, Holly R, MD   400 mg at 01/10/18 2694  . hydrOXYzine (ATARAX/VISTARIL) tablet 25 mg  25 mg Oral TID PRN Pucilowska, Jolanta B, MD      . LORazepam (ATIVAN) tablet 0.5 mg  0.5 mg Oral BID Marylin Crosby, MD   0.5 mg at 01/10/18 8546  . magnesium hydroxide (MILK OF MAGNESIA) suspension 30 mL  30 mL Oral Daily PRN Pucilowska, Jolanta B, MD      . memantine (NAMENDA XR) 24 hr capsule 28 mg  28 mg Oral Daily McNew, Tyson Babinski, MD   28 mg at 01/10/18 2703  . mirtazapine (REMERON) tablet 30 mg  30 mg Oral QHS McNew, Holly R, MD   30 mg at 01/09/18 2116  . nicotine (NICODERM CQ - dosed in mg/24 hours) patch 21 mg  21 mg Transdermal Q0600 Pucilowska, Jolanta B, MD   21 mg at 01/10/18 0826  . pantoprazole (PROTONIX) EC tablet 40 mg  40 mg Oral Daily McNew, Tyson Babinski, MD   40 mg at 01/10/18 5009  . QUEtiapine (SEROQUEL XR) 24 hr tablet 300 mg  300 mg Oral QHS McNew, Tyson Babinski, MD   300 mg at 01/09/18 2116  . simvastatin (ZOCOR) tablet 20 mg  20 mg Oral q1800 Pucilowska, Jolanta B, MD   20 mg at 01/09/18 1721  . tamsulosin (FLOMAX) capsule 0.4 mg  0.4 mg Oral Daily Pucilowska, Jolanta B, MD   0.4 mg at 01/10/18 3818  . vitamin B-12 (CYANOCOBALAMIN) tablet 1,000 mcg  1,000 mcg Oral Daily Chauncey Mann, MD        Lab Results:  No results found for this or any previous visit (from the past 48 hour(s)).  Blood Alcohol level:  Lab Results  Component Value Date   ETH <10 01/01/2018   ETH <5 29/93/7169    Metabolic Disorder Labs: Lab Results  Component Value Date   HGBA1C 5.3 01/01/2018   MPG 105.41 01/01/2018   No results found for: PROLACTIN Lab Results  Component Value Date   CHOL 94 01/01/2018   TRIG 65 01/01/2018   HDL 26 (L) 01/01/2018   CHOLHDL 3.6 01/01/2018   VLDL 13 01/01/2018   LDLCALC 55 01/01/2018    Physical Findings: AIMS: Facial and Oral Movements Muscles of Facial Expression: None, normal Lips and Perioral  Area: None, normal Jaw: None, normal Tongue: None, normal,Extremity Movements Upper (arms, wrists, hands, fingers): None, normal Lower (legs, knees,  ankles, toes): None, normal, Trunk Movements Neck, shoulders, hips: None, normal, Overall Severity Severity of abnormal movements (highest score from questions above): None, normal Incapacitation due to abnormal movements: None, normal Patient's awareness of abnormal movements (rate only patient's report): No Awareness, Dental Status Current problems with teeth and/or dentures?: No Does patient usually wear dentures?: No  CIWA:  CIWA-Ar Total: 2 COWS:     Musculoskeletal: Strength & Muscle Tone: within normal limits Gait & Station: normal Patient leans: N/A  Psychiatric Specialty Exam: Physical Exam See H+P  Review of Systems  Constitutional: Negative.   HENT: Negative.   Eyes: Negative.   Respiratory: Negative.   Cardiovascular: Negative.   Gastrointestinal: Negative.   Genitourinary: Negative.   Musculoskeletal: Negative.   Skin: Negative.   Neurological: Positive for dizziness. Negative for tingling, tremors, sensory change, speech change and headaches.       He patient is complaining of some mild dizziness and BP is low this morning  Endo/Heme/Allergies: Negative.     Blood pressure (!) 80/57, pulse 76, temperature 97.7 F (36.5 C), temperature source Oral, resp. rate 18, height 6\' 1"  (1.854 m), weight 64 kg (141 lb), SpO2 96 %.Body mass index is 18.6 kg/m.  General Appearance: Casual  Eye Contact:  Good  Speech:  Clear and Coherent  Volume:  Normal, regular rate  Mood:  Euthymic  Affect:  Full and congruent  Thought Process:  Coherent and Goal Directed  Orientation:  Full (Time, Place, and Person)  Thought Content:  Logical  Suicidal Thoughts:  No  Homicidal Thoughts:  No  Memory:  Immediate;   Fair  Judgement:  Fair  Insight:  Lacking  Psychomotor Activity:  Normal  Concentration:  Concentration: Fair  Recall:   [employer] of Knowledge:  Fair  Language:  Fair  Akathisia:  No      Assets:  Communication Skills Desire for Improvement Financial Resources/Insurance Resilience  ADL's:  Intact  Cognition:  Impaired,  Mild  Sleep:  Number of Hours: 7.45     Treatment Plan Summary: 67 yo male admitted due to running away from group home. HE has been very calm and very pleasant on the unit. He is organized in conversation. Hygiene is good. HE feels less dizzy this morning with decreased dose of Seroquel.   Plan:  Schizophrenia -Continue Thorazine 25 mg TID for psychosis -Continue amantadine 100 mg BID for tremor -Continue lower dose of Seroquel XR 300 mg. Switch back to XR version of Seroqel -Continue Depakote 500 mg BID. VPA level of 55 and no elevation of LFTs -Continue lower dose of Ativan 0.5 mg BID. Goal will be to taper off completely.  -Continue Remeron 30mg  nightly which helps with insomnia and appetite -EKG showed a QTc of 462. Total cholesterol was 94 and hemoglobin A1c was 5.3  Cognitive impairment -continue Aricept and Namenda  Low BP: He has some mild dizziness Will recheck BMP. Patient had slightly elevated Creatinine Will push fluids Seroquel may be lowering BP  Low B12 of < 400, lower end of normal Will add B12 1070mcg po daily as low B12 can cause cognitive problems  UA negative for UTI, B12 normal, Folate and RPR normal  PPD to be read today as it was not read yesterday  Dispo -He had interview with group home  Yesterday and social work will followup with the group home Monday. He will need psychotropic medication management at the time of discharge.  Chauncey Mann, MD 01/10/2018, 10:55 AM

## 2018-01-10 NOTE — Plan of Care (Signed)
  Problem: Elimination: Goal: Will not experience complications related to bowel motility Outcome: Progressing   Problem: Safety: Goal: Periods of time without injury will increase Outcome: Progressing

## 2018-01-10 NOTE — Plan of Care (Signed)
Pt. Reports he can remain safe while on the unit. Denies SI/HI. Contracts for safety.   Problem: Safety: Goal: Periods of time without injury will increase Outcome: Progressing

## 2018-01-11 MED ORDER — LORAZEPAM 0.5 MG PO TABS
0.5000 mg | ORAL_TABLET | Freq: Every day | ORAL | Status: DC
  Administered 2018-01-11 – 2018-01-13 (×3): 0.5 mg via ORAL
  Filled 2018-01-11 (×3): qty 1

## 2018-01-11 MED ORDER — QUETIAPINE FUMARATE 100 MG PO TABS
300.0000 mg | ORAL_TABLET | Freq: Every day | ORAL | Status: DC
  Administered 2018-01-11 – 2018-01-13 (×3): 300 mg via ORAL
  Filled 2018-01-11 (×2): qty 3

## 2018-01-11 NOTE — Progress Notes (Signed)
Recreation Therapy Notes   Date: 01/11/2018  Time: 9:30 am  Location: Craft Room  Behavioral response: N/A  Intervention Topic: Creative Expression  Discussion/Intervention: Patient did not attend group.  Clinical Observations/Feedback:  Patient did not attend group.  Glorianne Proctor LRT/CTRS           Kieran Arreguin 01/11/2018 12:43 PM

## 2018-01-11 NOTE — Plan of Care (Signed)
Patient and cooperative.  Denies SI/HI/AVH.  Bright affect. Tends to stay to himself.  Up for meals, medication pass and to attend groups.  Verbalizes that he feels good and hope that he gets to go to the group home that interviewed him on Saturday.  Support and encouragement offered.  Safety rounds maintained.   Problem: Education: Goal: Ability to state activities that reduce stress will improve Outcome: Progressing   Problem: Coping: Goal: Ability to identify and develop effective coping behavior will improve Outcome: Progressing   Problem: Elimination: Goal: Will not experience complications related to bowel motility Outcome: Progressing   Problem: Safety: Goal: Periods of time without injury will increase Outcome: Progressing

## 2018-01-11 NOTE — NC FL2 (Signed)
[place] LEVEL OF CARE SCREENING TOOL     IDENTIFICATION  Patient Name: Joseph Holt. Birthdate: 05-11-1951 Sex: male Admission Date (Current Location): 01/02/2018  [place] and [place] Number:  Engineering geologist and Address:  [facility], [address] [place],  [address]      Provider Number: [id number]  Attending Physician Name and Address:  Marylin Crosby, MD  Relative Name and Phone Number:  Malikai Gut- Brother- [phone]    Current Level of Care: Hospital Recommended Level of Care: [facility] Prior Approval Number:    Date Approved/Denied:   PASRR Number:    Discharge Plan: Domiciliary (Rest home)    Current Diagnoses: Patient Active Problem List   Diagnosis Date Noted  . Cognitive impairment 01/06/2018  . Tobacco use disorder 01/02/2018  . Paranoid schizophrenia ([place]) 02/21/2015  . Social maladjustment 02/21/2015    Orientation RESPIRATION BLADDER Height & Weight     Self, Time, Situation, Place  Normal Continent Weight: 141 lb (64 kg) Height:  6\' 1"  (185.4 cm)  BEHAVIORAL SYMPTOMS/MOOD NEUROLOGICAL BOWEL NUTRITION STATUS  (N/A) (N/A) Continent Diet(Regualr)  AMBULATORY STATUS COMMUNICATION OF NEEDS Skin   Independent Verbally Normal                       Personal Care Assistance Level of Assistance  Feeding, Dressing, Total care, Bathing Bathing Assistance: Limited assistance(Needs assistance getting in and out of the bathtub) Feeding assistance: Independent Dressing Assistance: Independent Total Care Assistance: Independent   Functional Limitations Info  Sight, Hearing, Speech Sight Info: Adequate Hearing Info: Adequate Speech Info: Adequate    SPECIAL CARE FACTORS FREQUENCY  (Mild Cognative Impairment)                    Contractures Contractures Info: Not present    Additional Factors Info  Psychotropic     Psychotropic Info: Psycotropic medications          Current Medications (01/11/2018):  This is the current hospital active medication list Current Facility-Administered Medications  Medication Dose Route Frequency Provider Last Rate Last Dose  . acetaminophen (TYLENOL) tablet 650 mg  650 mg Oral Q6H PRN Pucilowska, Jolanta B, MD      . alum & mag hydroxide-simeth (MAALOX/MYLANTA) 200-200-20 MG/5ML suspension 30 mL  30 mL Oral Q4H PRN Pucilowska, Jolanta B, MD   30 mL at 01/04/18 2109  . amantadine (SYMMETREL) capsule 100 mg  100 mg Oral BID Marylin Crosby, MD   100 mg at 01/11/18 0855  . chlorproMAZINE (THORAZINE) tablet 25 mg  25 mg Oral TID Pucilowska, Jolanta B, MD   25 mg at 01/11/18 1223  . divalproex (DEPAKOTE) DR tablet 500 mg  500 mg Oral Q12H McNew, Tyson Babinski, MD   500 mg at 01/11/18 0855  . donepezil (ARICEPT) tablet 10 mg  10 mg Oral Roxy Manns, Tyson Babinski, MD   10 mg at 01/11/18 [phone]  . feeding supplement (ENSURE ENLIVE) (ENSURE ENLIVE) liquid 237 mL  237 mL Oral BID BM McNew, Tyson Babinski, MD   237 mL at 01/11/18 1229  . gabapentin (NEURONTIN) capsule 400 mg  400 mg Oral Daily McNew, Tyson Babinski, MD   400 mg at 01/11/18 0855  . hydrOXYzine (ATARAX/VISTARIL) tablet 25 mg  25 mg Oral TID PRN Pucilowska, Jolanta B, MD      . LORazepam (ATIVAN) tablet 0.5 mg  0.5 mg Oral BID McNew, Tyson Babinski, MD  0.5 mg at 01/11/18 0855  . magnesium hydroxide (MILK OF MAGNESIA) suspension 30 mL  30 mL Oral Daily PRN Pucilowska, Jolanta B, MD      . memantine (NAMENDA XR) 24 hr capsule 28 mg  28 mg Oral Daily McNew, Tyson Babinski, MD   28 mg at 01/11/18 0855  . mirtazapine (REMERON) tablet 30 mg  30 mg Oral QHS McNew, Tyson Babinski, MD   30 mg at 01/10/18 2109  . nicotine (NICODERM CQ - dosed in mg/24 hours) patch 21 mg  21 mg Transdermal Q0600 Pucilowska, Jolanta B, MD   21 mg at 01/11/18 0854  . pantoprazole (PROTONIX) EC tablet 40 mg  40 mg Oral Daily McNew, Tyson Babinski, MD   40 mg at 01/11/18 0855  . QUEtiapine (SEROQUEL XR) 24 hr tablet 300 mg  300 mg Oral QHS McNew, Tyson Babinski, MD   300 mg at 01/10/18 2109  . simvastatin (ZOCOR) tablet 20 mg  20 mg Oral q1800 Pucilowska, Jolanta B, MD   20 mg at 01/10/18 1720  . tamsulosin (FLOMAX) capsule 0.4 mg  0.4 mg Oral Daily Pucilowska, Jolanta B, MD   0.4 mg at 01/11/18 0855  . vitamin B-12 (CYANOCOBALAMIN) tablet 1,000 mcg  1,000 mcg Oral Daily Chauncey Mann, MD   1,000 mcg at 01/11/18 5732     Discharge Medications: Please see discharge summary for a list of discharge medications.  Relevant Imaging Results:  Relevant Lab Results:   Additional Information N/A  Darin Engels, LCSW

## 2018-01-11 NOTE — Plan of Care (Signed)
Pt. Verbalizes he can remain safe while on the unit. Denies SI/HI and verbally is able to contract for safety.  Problem: Safety: Goal: Periods of time without injury will increase Outcome: Progressing

## 2018-01-11 NOTE — Progress Notes (Signed)
D:Pt denies SI/HI/AVH. Pt is pleasant and cooperative, but isolative often. Pt. Was observed out of his room a few times, but a majority of the evening spent laying in bed. Pt.has noComplaints.Patient Interaction minimal, but appropriate and present in the milieu during snack time. No complaints of depression or anxiety. During assessment pt. Appeared to have some thought blocking at times during conversation. Reports doing, "good".   A: Q x 15 minute observation checks were completed for safety. Patient was provided with education. Patient was given scheduled medications. Patient was encourage to attend groups, participate in unit activities and continue with plan of care.   R:Patient is complaint with medication and unit procedures. Does not attend group.            Precautionary checks every 15 minutes for safety maintained, room free of safety hazards, patient sustains no injury or falls during this shift.

## 2018-01-11 NOTE — Plan of Care (Signed)
  Problem: Education: Goal: Ability to state activities that reduce stress will improve Outcome: Progressing   Problem: Coping: Goal: Ability to identify and develop effective coping behavior will improve Outcome: Progressing   Problem: Elimination: Goal: Will not experience complications related to bowel motility Outcome: Progressing   Problem: Safety: Goal: Periods of time without injury will increase Outcome: Progressing

## 2018-01-11 NOTE — [facility] Group Notes (Signed)
[facility] Group Notes:  (Nursing/MHT/Case Management/Adjunct)  Date:  01/11/2018  Time:  4:17 PM  Type of Therapy:  Psychoeducational Skills  Participation Level:  Did Not Attend   Adela Lank [facility] 01/11/2018, 4:17 PM

## 2018-01-11 NOTE — Progress Notes (Signed)
[facility] MD Progress Note  01/11/2018 1:53 PM [place].  MRN:  [id number] Subjective:  Pt states that he is doing fine today. He was wondering about the group home. He states that the interview went well over the weekend and is hoping to get in. He denies feeling dizzy. He ate lunch and appetite is improving. He is caring for his ADLs well. He states taht he is going to shave today and do some laundry. He is fully oriented. He is less talkative today. He states that he did sleep well last night. Denies SI or thoughts of self harm.   Principal Problem: Paranoid schizophrenia ([place]) Diagnosis:   Patient Active Problem List   Diagnosis Date Noted  . Paranoid schizophrenia ([place]) [F20.0] 02/21/2015    Priority: High  . Cognitive impairment [R41.89] 01/06/2018  . Tobacco use disorder [F17.200] 01/02/2018  . Social maladjustment [Z60.9] 02/21/2015   Total Time spent with patient: 20 minutes  Past Psychiatric History: See H&P  Past Medical History:  Past Medical History:  Diagnosis Date  . Bipolar 1 disorder ([place])   . Schizo affective schizophrenia [facility])     Past Surgical History:  Procedure Laterality Date  . APPENDECTOMY    . HERNIA REPAIR     Family History: History reviewed. No pertinent family history. Family Psychiatric  History: See H&P Social History:  Social History   Substance and Sexual Activity  Alcohol Use No     Social History   Substance and Sexual Activity  Drug Use No    Social History   Socioeconomic History  . Marital status: Single    Spouse name: Not on file  . Number of children: Not on file  . Years of education: Not on file  . Highest education level: Not on file  Occupational History  . Not on file  Social Needs  . Financial resource strain: Not on file  . Food insecurity:    Worry: Not on file    Inability: Not on file  . Transportation needs:    Medical: Not on file    Non-medical: Not on file  Tobacco Use  . Smoking status:  Current Every Day Smoker    Packs/day: 0.20    Types: Cigarettes  . Smokeless tobacco: Never Used  . Tobacco comment: pt smokes 1 cigarette a day  Substance and Sexual Activity  . Alcohol use: No  . Drug use: No  . Sexual activity: Not on file  Lifestyle  . Physical activity:    Days per week: Not on file    Minutes per session: Not on file  . Stress: Not on file  Relationships  . Social connections:    Talks on phone: Not on file    Gets together: Not on file    Attends religious service: Not on file    Active member of club or organization: Not on file    Attends meetings of clubs or organizations: Not on file    Relationship status: Not on file  Other Topics Concern  . Not on file  Social History Narrative  . Not on file   Additional Social History:    Pain Medications: Please see MAR Prescriptions: Please see MAR Over the Counter: Please see MAR History of alcohol / drug use?: No history of alcohol / drug abuse Longest period of sobriety (when/how long): N/A                    Sleep: Fair  Appetite:  Fair  Current Medications: Current Facility-Administered Medications  Medication Dose Route Frequency Provider Last Rate Last Dose  . acetaminophen (TYLENOL) tablet 650 mg  650 mg Oral Q6H PRN Pucilowska, Jolanta B, MD      . alum & mag hydroxide-simeth (MAALOX/MYLANTA) 200-200-20 MG/5ML suspension 30 mL  30 mL Oral Q4H PRN Pucilowska, Jolanta B, MD   30 mL at 01/04/18 2109  . amantadine (SYMMETREL) capsule 100 mg  100 mg Oral BID Marylin Crosby, MD   100 mg at 01/11/18 0855  . chlorproMAZINE (THORAZINE) tablet 25 mg  25 mg Oral TID Pucilowska, Jolanta B, MD   25 mg at 01/11/18 1223  . divalproex (DEPAKOTE) DR tablet 500 mg  500 mg Oral Q12H Derius Ghosh, Tyson Babinski, MD   500 mg at 01/11/18 0855  . donepezil (ARICEPT) tablet 10 mg  10 mg Oral Roxy Manns, Tyson Babinski, MD   10 mg at 01/11/18 [phone]  . feeding supplement (ENSURE ENLIVE) (ENSURE ENLIVE) liquid 237 mL  237 mL  Oral BID BM Alzina Golda, Tyson Babinski, MD   237 mL at 01/11/18 1229  . gabapentin (NEURONTIN) capsule 400 mg  400 mg Oral Daily Meklit Cotta R, MD   400 mg at 01/11/18 0855  . hydrOXYzine (ATARAX/VISTARIL) tablet 25 mg  25 mg Oral TID PRN Pucilowska, Jolanta B, MD      . LORazepam (ATIVAN) tablet 0.5 mg  0.5 mg Oral BID Laurie Penado, Tyson Babinski, MD   0.5 mg at 01/11/18 0855  . magnesium hydroxide (MILK OF MAGNESIA) suspension 30 mL  30 mL Oral Daily PRN Pucilowska, Jolanta B, MD      . memantine (NAMENDA XR) 24 hr capsule 28 mg  28 mg Oral Daily Courtni Balash, Tyson Babinski, MD   28 mg at 01/11/18 0855  . mirtazapine (REMERON) tablet 30 mg  30 mg Oral QHS Masiah Woody, Tyson Babinski, MD   30 mg at 01/10/18 2109  . nicotine (NICODERM CQ - dosed in mg/24 hours) patch 21 mg  21 mg Transdermal Q0600 Pucilowska, Jolanta B, MD   21 mg at 01/11/18 0854  . pantoprazole (PROTONIX) EC tablet 40 mg  40 mg Oral Daily Arya Luttrull, Tyson Babinski, MD   40 mg at 01/11/18 0855  . QUEtiapine (SEROQUEL XR) 24 hr tablet 300 mg  300 mg Oral QHS Sheika Coutts, Tyson Babinski, MD   300 mg at 01/10/18 2109  . simvastatin (ZOCOR) tablet 20 mg  20 mg Oral q1800 Pucilowska, Jolanta B, MD   20 mg at 01/10/18 1720  . tamsulosin (FLOMAX) capsule 0.4 mg  0.4 mg Oral Daily Pucilowska, Jolanta B, MD   0.4 mg at 01/11/18 0855  . vitamin B-12 (CYANOCOBALAMIN) tablet 1,000 mcg  1,000 mcg Oral Daily Chauncey Mann, MD   1,000 mcg at 01/11/18 2841    Lab Results:  Results for orders placed or performed during the hospital encounter of 01/02/18 (from the past 48 hour(s))  Basic metabolic panel     Status: Abnormal   Collection Time: 01/10/18  1:04 PM  Result Value Ref Range   Sodium 137 135 - 145 mmol/L   Potassium 4.2 3.5 - 5.1 mmol/L   Chloride 100 (L) 101 - 111 mmol/L   CO2 29 22 - 32 mmol/L   Glucose, Bld 124 (H) 65 - 99 mg/dL   BUN 24 (H) 6 - 20 mg/dL   Creatinine, Ser 1.19 0.61 - 1.24 mg/dL   Calcium 8.5 (L) 8.9 - 10.3 mg/dL   GFR calc  non Af Amer >60 >60 mL/min   GFR calc Af Amer >60 >60  mL/min    Comment: (NOTE) The eGFR has been calculated using the CKD EPI equation. This calculation has not been validated in all clinical situations. eGFR's persistently <60 mL/min signify possible Chronic Kidney Disease.    Anion gap 8 5 - 15    Comment: Performed at [facility], [address] [place], [place] [address]    Blood Alcohol level:  Lab Results  Component Value Date   [facility] <10 01/01/2018   ETH <5 05/39/7673    Metabolic Disorder Labs: Lab Results  Component Value Date   HGBA1C 5.3 01/01/2018   MPG 105.41 01/01/2018   No results found for: PROLACTIN Lab Results  Component Value Date   CHOL 94 01/01/2018   TRIG 65 01/01/2018   HDL 26 (L) 01/01/2018   CHOLHDL 3.6 01/01/2018   VLDL 13 01/01/2018   LDLCALC 55 01/01/2018    Physical Findings: AIMS: Facial and Oral Movements Muscles of Facial Expression: None, normal Lips and Perioral Area: None, normal Jaw: None, normal Tongue: None, normal,Extremity Movements Upper (arms, wrists, hands, fingers): None, normal Lower (legs, knees, ankles, toes): None, normal, Trunk Movements Neck, shoulders, hips: None, normal, Overall Severity Severity of abnormal movements (highest score from questions above): None, normal Incapacitation due to abnormal movements: None, normal Patient's awareness of abnormal movements (rate only patient's report): No Awareness, Dental Status Current problems with teeth and/or dentures?: No Does patient usually wear dentures?: No  CIWA:  CIWA-Ar Total: 2 COWS:     Musculoskeletal: Strength & Muscle Tone: within normal limits Gait & Station: normal Patient leans: Backward and N/A  Psychiatric Specialty Exam: Physical Exam  Nursing note and vitals reviewed.   Review of Systems  All other systems reviewed and are negative.   Blood pressure 100/69, pulse 83, temperature 97.7 F (36.5 C), temperature source Oral, resp. rate 18, height 6' 1"  (1.854 m), weight 64 kg (141  lb), SpO2 97 %.Body mass index is 18.6 kg/m.  General Appearance: Casual  Eye Contact:  Fair  Speech:  Clear and Coherent  Volume:  Normal  Mood:  Euthymic  Affect:  Constricted  Thought Process:  Coherent and Goal Directed  Orientation:  Full (Time, Place, and Person)  Thought Content:  Logical  Suicidal Thoughts:  No  Homicidal Thoughts:  No  Memory:  Immediate;   Fair  Judgement:  Intact  Insight:  Fair  Psychomotor Activity:  Normal  Concentration:  Concentration: Good  Recall:  [employer] of Knowledge:  Fair  Language:  Fair  Akathisia:  No      Assets:  Resilience  ADL's:  Intact  Cognition:  Impaired,  Mild  Sleep:  Number of Hours: 7.45     Treatment Plan Summary: 67 yo male admitted due to running away from group home. He has been very calm and pleasant on the unit. He is overall organized in conversation. He is caring for his ADLs well. He is fully oriented.   Plan:  Schizophrenia -Continue thorazine 25 mg TID -Continue Amantadine 100 mg BID -Continue Seroquel  300 mg qhs. Switch to IR version due to hypotensive episodes -Continue Depakote 500 mg BID -Decrease Ativan to 0.5 mg qhs -Continue Remeron 30 mg qhs --EKG showed a QTc of 462. Total cholesterol was 94 and hemoglobin A1c was 5.3  Cognitive impairment -continue Aricept and Namenda  Low BP:-better today No dizziness today Creatinine better now at 1.19  Will push fluids Seroquel may be lowering BP  Low B12 of < 400, lower end of normal Continue B12 1020mg po daily as low B12 can cause cognitive problems  UA negative for UTI, B12 normal, Folate and RPR normal   Dispo -He had interview with group home  CSW to follow up. He will need psychotropic medication management at the time of discharge.     HMarylin Crosby MD 01/11/2018, 1:53 PM

## 2018-01-11 NOTE — Progress Notes (Signed)
D: Patient is alert and oriented x 4, denies SI/HI/AVH, affect is blunted, mood is pleasant and he is cooperative with nursing plan. Pt appears less anxious and he is interacting with peers and staff appropriately. Patient's thoughts are organized.  A: Pt was offered support and encouraged to attend evening wrap up group. 15 minutes checks were done for safety.  R:Pt attends evening wrap up group and interacts well with peers and staff. Patient is complaint with medication. Safety maintained on unit will continue to monitor.

## 2018-01-11 NOTE — NC FL2 (Signed)
[place] LEVEL OF CARE SCREENING TOOL     IDENTIFICATION  Patient Name: Joseph Holt. Birthdate: 1951-09-22 Sex: male Admission Date (Current Location): 01/02/2018  [place] and [place] Number:  Engineering geologist and Address:  [facility], [address] [place], [place] [address]      Provider Number: [id number]  Attending Physician Name and Address:  Marylin Crosby, MD  Relative Name and Phone Number:  [name]- Brother- [phone]    Current Level of Care: Hospital Recommended Level of Care: [facility] Prior Approval Number:    Date Approved/Denied:   PASRR Number:    Discharge Plan: Domiciliary (Rest home)    Current Diagnoses: Patient Active Problem List   Diagnosis Date Noted  . Cognitive impairment 01/06/2018  . Tobacco use disorder 01/02/2018  . Paranoid schizophrenia ([place]) 02/21/2015  . Social maladjustment 02/21/2015    Orientation RESPIRATION BLADDER Height & Weight     Self, Time, Situation, Place  Normal Continent Weight: 141 lb (64 kg) Height:  6\' 1"  (185.4 cm)  BEHAVIORAL SYMPTOMS/MOOD NEUROLOGICAL BOWEL NUTRITION STATUS  (N/A) (N/A) Continent Diet(Regualr)  AMBULATORY STATUS COMMUNICATION OF NEEDS Skin   Independent Verbally Normal                       Personal Care Assistance Level of Assistance  Feeding, Dressing, Total care, Bathing Bathing Assistance: Limited assistance(Needs assistance getting in and out of the bathtub) Feeding assistance: Independent Dressing Assistance: Independent Total Care Assistance: Independent   Functional Limitations Info  Sight, Hearing, Speech Sight Info: Adequate Hearing Info: Adequate Speech Info: Adequate    SPECIAL CARE FACTORS FREQUENCY  (Dementia)                    Contractures Contractures Info: Not present    Additional Factors Info  Psychotropic     Psychotropic Info: Psycotropic medications         Current  Medications (01/11/2018):  This is the current hospital active medication list Current Facility-Administered Medications  Medication Dose Route Frequency Provider Last Rate Last Dose  . acetaminophen (TYLENOL) tablet 650 mg  650 mg Oral Q6H PRN Pucilowska, Jolanta B, MD      . alum & mag hydroxide-simeth (MAALOX/MYLANTA) 200-200-20 MG/5ML suspension 30 mL  30 mL Oral Q4H PRN Pucilowska, Jolanta B, MD   30 mL at 01/04/18 2109  . amantadine (SYMMETREL) capsule 100 mg  100 mg Oral BID Marylin Crosby, MD   100 mg at 01/10/18 1720  . chlorproMAZINE (THORAZINE) tablet 25 mg  25 mg Oral TID Pucilowska, Jolanta B, MD   25 mg at 01/10/18 1719  . divalproex (DEPAKOTE) DR tablet 500 mg  500 mg Oral Q12H McNew, Tyson Babinski, MD   500 mg at 01/10/18 2109  . donepezil (ARICEPT) tablet 10 mg  10 mg Oral Roxy Manns, Tyson Babinski, MD   10 mg at 01/11/18 [phone]  . feeding supplement (ENSURE ENLIVE) (ENSURE ENLIVE) liquid 237 mL  237 mL Oral BID BM McNew, Holly R, MD   237 mL at 01/10/18 1350  . gabapentin (NEURONTIN) capsule 400 mg  400 mg Oral Daily McNew, Tyson Babinski, MD   400 mg at 01/10/18 1478  . hydrOXYzine (ATARAX/VISTARIL) tablet 25 mg  25 mg Oral TID PRN Pucilowska, Jolanta B, MD      . LORazepam (ATIVAN) tablet 0.5 mg  0.5 mg Oral BID McNew, Tyson Babinski, MD  0.5 mg at 01/10/18 1720  . magnesium hydroxide (MILK OF MAGNESIA) suspension 30 mL  30 mL Oral Daily PRN Pucilowska, Jolanta B, MD      . memantine (NAMENDA XR) 24 hr capsule 28 mg  28 mg Oral Daily McNew, Tyson Babinski, MD   28 mg at 01/10/18 2297  . mirtazapine (REMERON) tablet 30 mg  30 mg Oral QHS McNew, Tyson Babinski, MD   30 mg at 01/10/18 2109  . nicotine (NICODERM CQ - dosed in mg/24 hours) patch 21 mg  21 mg Transdermal Q0600 Pucilowska, Jolanta B, MD   21 mg at 01/10/18 0826  . pantoprazole (PROTONIX) EC tablet 40 mg  40 mg Oral Daily McNew, Tyson Babinski, MD   40 mg at 01/10/18 9892  . QUEtiapine (SEROQUEL XR) 24 hr tablet 300 mg  300 mg Oral QHS McNew, Tyson Babinski, MD   300 mg at  01/10/18 2109  . simvastatin (ZOCOR) tablet 20 mg  20 mg Oral q1800 Pucilowska, Jolanta B, MD   20 mg at 01/10/18 1720  . tamsulosin (FLOMAX) capsule 0.4 mg  0.4 mg Oral Daily Pucilowska, Jolanta B, MD   0.4 mg at 01/10/18 1194  . vitamin B-12 (CYANOCOBALAMIN) tablet 1,000 mcg  1,000 mcg Oral Daily Chauncey Mann, MD   1,000 mcg at 01/10/18 1720     Discharge Medications: Please see discharge summary for a list of discharge medications.  Relevant Imaging Results:  Relevant Lab Results:   Additional Information N/A  Darin Engels, LCSW

## 2018-01-12 NOTE — B[facility] Group Notes (Signed)
[place] Group Notes:  (Nursing/MHT/Case Management/Adjunct)  Date:  01/12/2018  Time:  9:18 PM  Type of Therapy:  Group Therapy  Participation Level:  Did Not Attend   Joseph Holt 01/12/2018, 9:18 PM

## 2018-01-12 NOTE — Progress Notes (Signed)
Recreation Therapy Notes  Date: 01/12/2018  Time: 9:30 am   Location: Craft Room   Behavioral response: N/A   Intervention Topic: Goals   Discussion/Intervention: Patient did not attend group.   Clinical Observations/Feedback:  Patient did not attend group.   [name] LRT/CTRS        [name] 01/12/2018 10:59 AM

## 2018-01-12 NOTE — Progress Notes (Signed)
[facility] MD Progress Note  01/12/2018 1:16 PM [place].  MRN:  [id number]   Subjective:  Pt states that he is doing well today. He is caring for his ADLs well. He wants to shave today , if possible. He ate a good breakfast. He has mostly been isolating to his room. He looks a little down today but he states that he is not feeling depressed. HE is asking about care homes. He denies SI or thoughts of self harm. Denies AH, VH. He is fully oriented to date, place and president.   Principal Problem: Paranoid schizophrenia ([place]) Diagnosis:   Patient Active Problem List   Diagnosis Date Noted  . Paranoid schizophrenia ([place]) [F20.0] 02/21/2015    Priority: High  . Cognitive impairment [R41.89] 01/06/2018  . Tobacco use disorder [F17.200] 01/02/2018  . Social maladjustment [Z60.9] 02/21/2015   Total Time spent with patient: 20 minutes  Past Psychiatric History: See H&P  Past Medical History:  Past Medical History:  Diagnosis Date  . Bipolar 1 disorder ([place])   . Schizo affective schizophrenia [facility])     Past Surgical History:  Procedure Laterality Date  . APPENDECTOMY    . HERNIA REPAIR     Family History: History reviewed. No pertinent family history. Family Psychiatric  History: See H&P Social History:  Social History   Substance and Sexual Activity  Alcohol Use No     Social History   Substance and Sexual Activity  Drug Use No    Social History   Socioeconomic History  . Marital status: Single    Spouse name: Not on file  . Number of children: Not on file  . Years of education: Not on file  . Highest education level: Not on file  Occupational History  . Not on file  Social Needs  . Financial resource strain: Not on file  . Food insecurity:    Worry: Not on file    Inability: Not on file  . Transportation needs:    Medical: Not on file    Non-medical: Not on file  Tobacco Use  . Smoking status: Current Every Day Smoker    Packs/day: 0.20    Types:  Cigarettes  . Smokeless tobacco: Never Used  . Tobacco comment: pt smokes 1 cigarette a day  Substance and Sexual Activity  . Alcohol use: No  . Drug use: No  . Sexual activity: Not on file  Lifestyle  . Physical activity:    Days per week: Not on file    Minutes per session: Not on file  . Stress: Not on file  Relationships  . Social connections:    Talks on phone: Not on file    Gets together: Not on file    Attends religious service: Not on file    Active member of club or organization: Not on file    Attends meetings of clubs or organizations: Not on file    Relationship status: Not on file  Other Topics Concern  . Not on file  Social History Narrative  . Not on file   Additional Social History:    Pain Medications: Please see MAR Prescriptions: Please see MAR Over the Counter: Please see MAR History of alcohol / drug use?: No history of alcohol / drug abuse Longest period of sobriety (when/how long): N/A                    Sleep: Good  Appetite:  Good  Current Medications: Current  Facility-Administered Medications  Medication Dose Route Frequency Provider Last Rate Last Dose  . acetaminophen (TYLENOL) tablet 650 mg  650 mg Oral Q6H PRN Pucilowska, Jolanta B, MD      . alum & mag hydroxide-simeth (MAALOX/MYLANTA) 200-200-20 MG/5ML suspension 30 mL  30 mL Oral Q4H PRN Pucilowska, Jolanta B, MD   30 mL at 01/04/18 2109  . amantadine (SYMMETREL) capsule 100 mg  100 mg Oral BID Marylin Crosby, MD   100 mg at 01/12/18 0754  . chlorproMAZINE (THORAZINE) tablet 25 mg  25 mg Oral TID Pucilowska, Jolanta B, MD   25 mg at 01/12/18 1124  . divalproex (DEPAKOTE) DR tablet 500 mg  500 mg Oral Q12H McNew, Tyson Babinski, MD   500 mg at 01/12/18 0754  . donepezil (ARICEPT) tablet 10 mg  10 mg Oral [employer] [employer], MD   10 mg at 01/12/18 [phone]  . feeding supplement (ENSURE ENLIVE) (ENSURE ENLIVE) liquid 237 mL  237 mL Oral BID BM McNew, Tyson Babinski, MD   237 mL at 01/12/18 1015   . gabapentin (NEURONTIN) capsule 400 mg  400 mg Oral Daily McNew, Tyson Babinski, MD   400 mg at 01/12/18 0754  . hydrOXYzine (ATARAX/VISTARIL) tablet 25 mg  25 mg Oral TID PRN Pucilowska, Jolanta B, MD      . LORazepam (ATIVAN) tablet 0.5 mg  0.5 mg Oral QHS McNew, Tyson Babinski, MD   0.5 mg at 01/11/18 2117  . magnesium hydroxide (MILK OF MAGNESIA) suspension 30 mL  30 mL Oral Daily PRN Pucilowska, Jolanta B, MD      . memantine (NAMENDA XR) 24 hr capsule 28 mg  28 mg Oral Daily McNew, Tyson Babinski, MD   28 mg at 01/12/18 0754  . mirtazapine (REMERON) tablet 30 mg  30 mg Oral QHS McNew, Tyson Babinski, MD   30 mg at 01/11/18 2118  . nicotine (NICODERM CQ - dosed in mg/24 hours) patch 21 mg  21 mg Transdermal Q0600 Pucilowska, Jolanta B, MD   21 mg at 01/12/18 0643  . pantoprazole (PROTONIX) EC tablet 40 mg  40 mg Oral Daily McNew, Tyson Babinski, MD   40 mg at 01/12/18 0754  . QUEtiapine (SEROQUEL) tablet 300 mg  300 mg Oral QHS McNew, Tyson Babinski, MD   300 mg at 01/11/18 2100  . simvastatin (ZOCOR) tablet 20 mg  20 mg Oral q1800 Pucilowska, Jolanta B, MD   20 mg at 01/11/18 1714  . tamsulosin (FLOMAX) capsule 0.4 mg  0.4 mg Oral Daily Pucilowska, Jolanta B, MD   0.4 mg at 01/12/18 0754  . vitamin B-12 (CYANOCOBALAMIN) tablet 1,000 mcg  1,000 mcg Oral Daily Chauncey Mann, MD   1,000 mcg at 01/12/18 0754    Lab Results: No results found for this or any previous visit (from the past 48 hour(s)).  Blood Alcohol level:  Lab Results  Component Value Date   ETH <10 01/01/2018   ETH <5 67/09/4579    Metabolic Disorder Labs: Lab Results  Component Value Date   HGBA1C 5.3 01/01/2018   MPG 105.41 01/01/2018   No results found for: PROLACTIN Lab Results  Component Value Date   CHOL 94 01/01/2018   TRIG 65 01/01/2018   HDL 26 (L) 01/01/2018   CHOLHDL 3.6 01/01/2018   VLDL 13 01/01/2018   LDLCALC 55 01/01/2018    Physical Findings: AIMS: Facial and Oral Movements Muscles of Facial Expression: None, normal Lips and  Perioral Area: None, normal Jaw:  None, normal Tongue: None, normal,Extremity Movements Upper (arms, wrists, hands, fingers): None, normal Lower (legs, knees, ankles, toes): None, normal, Trunk Movements Neck, shoulders, hips: None, normal, Overall Severity Severity of abnormal movements (highest score from questions above): None, normal Incapacitation due to abnormal movements: None, normal Patient's awareness of abnormal movements (rate only patient's report): No Awareness, Dental Status Current problems with teeth and/or dentures?: No Does patient usually wear dentures?: No  CIWA:  CIWA-Ar Total: 2 COWS:     Musculoskeletal: Strength & Muscle Tone: within normal limits Gait & Station: normal Patient leans: N/A  Psychiatric Specialty Exam: Physical Exam  Nursing note and vitals reviewed.   Review of Systems  All other systems reviewed and are negative.   Blood pressure 101/72, pulse 83, temperature (!) 97.5 F (36.4 C), temperature source Oral, resp. rate 18, height 6\' 1"  (1.854 m), weight 64 kg (141 lb), SpO2 96 %.Body mass index is 18.6 kg/m.  General Appearance: Casual  Eye Contact:  Good  Speech:  Clear and Coherent  Volume:  Normal  Mood:  Euthymic  Affect:  Constricted  Thought Process:  Coherent and Goal Directed  Orientation:  Full (Time, Place, and Person)  Thought Content:  Logical  Suicidal Thoughts:  No  Homicidal Thoughts:  No  Memory:  Immediate;   Fair  Judgement:  Fair  Insight:  Fair  Psychomotor Activity:  Normal  Concentration:  Concentration: Fair  Recall:  [employer] of Knowledge:  Fair  Language:  Fair  Akathisia:  No      Assets:  Resilience  ADL's:  Intact  Cognition:  Impaired,  Mild  Sleep:  Number of Hours: 7.15     Treatment Plan Summary: 67 yo male admitted due to running away from his group home. He has been very calm and pleasant on the unit. He is overall organized and goal directed in conversation with no psychotic symptoms.  He is caring for his ADLs well. CSW continues to search for group home for him as he would be unable to care for himself at a shelter.  Plan:  Schizophrenia -Continue thorazine 25 mg TID -Continue amantadine 100 mg BID for tremor -Continue Seroquel 300 mg qhs. BP better with lower dose and IR version. Dizziness is improved -Continue Depakote 500 mg BID -Continue lower dose of Ativan 0.5 mg qhs. Goal will be to taper off completely due to cognitive impairment -Continue Remeron 30 mg qhs for mood and insomnia  Cognitive impairment -Continue Aricept and Namenda -Caring well for his ADLs  Hypotension Encourage fluids -BP better past 2 days  Low B12 -Continue B12 1000 mcg daily  Dispo -CSW looking diligently into care homes that can accept him.  Marylin Crosby, MD 01/12/2018, 1:16 PM

## 2018-01-12 NOTE — BHH Group Notes (Signed)
01/12/2018 1PM  Type of Therapy/Topic:  Group Therapy:  Feelings about Diagnosis  Participation Level:  Minimal   Description of Group:   This group will allow patients to explore their thoughts and feelings about diagnoses they have received. Patients will be guided to explore their level of understanding and acceptance of these diagnoses. Facilitator will encourage patients to process their thoughts and feelings about the reactions of others to their diagnosis and will guide patients in identifying ways to discuss their diagnosis with significant others in their lives. This group will be process-oriented, with patients participating in exploration of their own experiences, giving and receiving support, and processing challenge from other group members.   Therapeutic Goals: 1. Patient will demonstrate understanding of diagnosis as evidenced by identifying two or more symptoms of the disorder 2. Patient will be able to express two feelings regarding the diagnosis 3. Patient will demonstrate their ability to communicate their needs through discussion and/or role play  Summary of Patient Progress: Joseph Holt stayed the entire time. He reports feeling "alright". He did not verbally participate in the group.       Therapeutic Modalities:   Cognitive Behavioral Therapy Brief Therapy Feelings Identification    Darin Engels, LCSW 01/12/2018 1:49 PM

## 2018-01-12 NOTE — Progress Notes (Signed)
Patient refused to go to group therapy tonight. Denied any acute pain. Denied any SI/HI. Room free from any hazards. Will continue to monitor every 15 minutes.

## 2018-01-12 NOTE — Progress Notes (Signed)
Patient self inventory- Patient slept good last night, appetite has been good, energy level has been normal, concentration has been good. Depression, anxiety, and hopelessness is rated at 0. Denies physical problems today. Patient claims pain level at a 1, with pain being in a toe. Patient's goal today is to go home.

## 2018-01-12 NOTE — [facility] Group Notes (Signed)
  01/12/2018  Time: 0900  Type of Therapy and Topic: Group Therapy: Goals Group: SMART Goals   Participation Level:  Active   Description of Group:   The purpose of a daily goals group is to assist and guide patients in setting recovery/wellness-related goals. The objective is to set goals as they relate to the crisis in which they were admitted. Patients will be using SMART goal modalities to set measurable goals. Characteristics of realistic goals will be discussed and patients will be assisted in setting and processing how one will reach their goal. Facilitator will also assist patients in applying interventions and coping skills learned in psycho-education groups to the SMART goal and process how one will achieve defined goal.   Therapeutic Goals:  -Patients will develop and document one goal related to or their crisis in which brought them into treatment.  -Patients will be guided by LCSW using SMART goal setting modality in how to set a measurable, attainable, realistic and time sensitive goal.  -Patients will process barriers in reaching goal.  -Patients will process interventions in how to overcome and successful in reaching goal.   Patient's Goal: Pt attended group and participated in discussion when directly prompted by CSW. Pt was irritable at times, but was able to accept redirection. Pt had a difficult time structuring his goal within the SMART guidelines, and stated his goal is to, "just have fun, life is too short." Pt was not able to restructure his goal when CSW re-explained the SMART guidelines.   Therapeutic Modalities:  Motivational Interviewing  Cognitive Behavioral Therapy  Crisis Intervention Model  SMART goals setting   Alden Hipp, MSW, LCSW Clinical Social Worker 01/12/2018 9:38 AM

## 2018-01-12 NOTE — Plan of Care (Signed)
Patient very pleasant and cooperative this morning. Patient denies SI/HI/AVH. Denies depression and anxiety. Patient's goal for the day is to "stay out of people's way" again and cooperate with staff so he can find placement. Patient cooperative with medications.  Patient safety maintained with 15 minute checks and support and encouragement given.    Problem: Education: Goal: Ability to state activities that reduce stress will improve Outcome: Progressing   Problem: Coping: Goal: Ability to identify and develop effective coping behavior will improve Outcome: Progressing   Problem: Elimination: Goal: Will not experience complications related to bowel motility Outcome: Progressing   Problem: Safety: Goal: Periods of time without injury will increase Outcome: Progressing

## 2018-01-12 NOTE — BHH Counselor (Signed)
CSW spoke with Manuela Schwartz from [facility] and was able to set up an appointment for her to come and assess the patient. CSW informed the patients Nurse and MD. Manuela Schwartz will be coming today Tuesday 01/12/2018 after 3pm when she gets off work.   Darin Engels, MSW, [place], LCASA 01/12/2018 2:02 PM

## 2018-01-12 NOTE — BHH Counselor (Signed)
CSW called the following Family Care homes to inquire about male beds for the patient:   . [place] (5) Frutoso Chase [address]; [place], [place] [address] [phone] Fax: [phone] [id number]- Mailbox full unable to leave voicemail . [facility] (6) Lavada Mesi [address]; [place], [place] [address] [phone] Fax: [phone]- Number not working . [place] (6) Reynold Bowen [address] [place], [place] [address] [phone] Fax: [phone] [id number]- No male beds available . [facility]. Rayann Heman, [address] [place], [address]. Phone: [phone] Fax: [phone]. CSW left a voicemail. [facility] (6) Lavada Mesi [address] [place], [place] [address] [phone] Fax: [phone]- Number not working . [place] (6) [place]; [place], [place] [address] [phone] Fax: [phone]- At full capacity . [facility] ([facility]) [facility], [facility] [address]; [place], [place] [address] [phone] Fax: [phone]- CSW left a voicemail. Pilar Plate [address]; [place], [place] [address] [phone] Fax: [phone]- CSW left a voicemail. Mar[facility] [place] (6) Chrystie Nose [address] [place], [place] [address] [phone] Fax: [phone] [id number]-Will fax over [facility] . [facility] [place] (3) Damita Lack. Redmond Pulling [address] [place], [place] [address] [phone] Fax: [phone] [id number]- No beds available at this time.  Mar[facility] Kitchen [place] (6) [place] [address] [place], [place] [address] [phone] Fax: [phone]- No beds available . [facility] (6) [place];  [place], [place] [address] [phone] Fax: [phone]- Marena Chancy took down my number to give to boss Marnette Burgess to call me back. [facility] (6) Cleone Slim [address]; [place], [place] [address] [phone] Fax: [phone]- Will fax over [facility]- Owner: Mary Pullium.   Darin Engels, MSW, [place], LCASA 01/12/2018 11:01 AM

## 2018-01-13 MED ORDER — QUETIAPINE FUMARATE 300 MG PO TABS: 300.0000 mg | ORAL_TABLET | Freq: Every day | ORAL | 0 refills | Status: DC

## 2018-01-13 MED ORDER — MEMANTINE HCL ER 28 MG PO CP24: 28.0000 mg | ORAL_CAPSULE | Freq: Every day | ORAL | 0 refills | Status: DC

## 2018-01-13 MED ORDER — QUETIAPINE FUMARATE 300 MG PO TABS
300.0000 mg | ORAL_TABLET | Freq: Every day | ORAL | 0 refills | Status: DC
Start: 2018-01-13 — End: 2018-01-20

## 2018-01-13 MED ORDER — SIMVASTATIN 20 MG PO TABS: 20.0000 mg | ORAL_TABLET | Freq: Every day | ORAL | 0 refills | Status: DC

## 2018-01-13 MED ORDER — MEMANTINE HCL-DONEPEZIL HCL ER 28-10 MG PO CP24: 1.0000 | ORAL_CAPSULE | Freq: Every day | ORAL | 1 refills | Status: DC

## 2018-01-13 MED ORDER — MIRTAZAPINE 30 MG PO TABS: 30.0000 mg | ORAL_TABLET | Freq: Every day | ORAL | 0 refills | Status: DC

## 2018-01-13 MED ORDER — GABAPENTIN 400 MG PO CAPS
400.0000 mg | ORAL_CAPSULE | Freq: Every morning | ORAL | 0 refills | Status: DC
Start: 2018-01-13 — End: 2018-01-20

## 2018-01-13 MED ORDER — DIVALPROEX SODIUM 500 MG PO DR TAB: 500.0000 mg | DELAYED_RELEASE_TABLET | Freq: Two times a day (BID) | ORAL | 0 refills | Status: DC

## 2018-01-13 MED ORDER — LORAZEPAM 0.5 MG PO TABS: 0.5000 mg | ORAL_TABLET | Freq: Every day | ORAL | 0 refills | Status: DC

## 2018-01-13 MED ORDER — AMANTADINE HCL 100 MG PO CAPS
100.0000 mg | ORAL_CAPSULE | Freq: Two times a day (BID) | ORAL | 0 refills | Status: DC
Start: 2018-01-13 — End: 2018-01-20

## 2018-01-13 MED ORDER — CHLORPROMAZINE HCL 25 MG PO TABS: 25.0000 mg | ORAL_TABLET | Freq: Three times a day (TID) | ORAL | 0 refills | Status: DC

## 2018-01-13 MED ORDER — AMANTADINE HCL 100 MG PO CAPS: 100.0000 mg | ORAL_CAPSULE | Freq: Two times a day (BID) | ORAL | 0 refills | Status: DC

## 2018-01-13 MED ORDER — TAMSULOSIN HCL 0.4 MG PO CAPS
0.4000 mg | ORAL_CAPSULE | Freq: Every day | ORAL | 0 refills | Status: DC
Start: 2018-01-13 — End: 2018-01-20

## 2018-01-13 MED ORDER — CYANOCOBALAMIN 1000 MCG PO TABS: 1000.0000 ug | ORAL_TABLET | Freq: Every day | ORAL | 0 refills | Status: DC

## 2018-01-13 MED ORDER — TAMSULOSIN HCL 0.4 MG PO CAPS: 0.4000 mg | ORAL_CAPSULE | Freq: Every day | ORAL | 0 refills | Status: DC

## 2018-01-13 MED ORDER — GABAPENTIN 400 MG PO CAPS: 400.0000 mg | ORAL_CAPSULE | Freq: Every morning | ORAL | 0 refills | Status: DC

## 2018-01-13 MED ORDER — DONEPEZIL HCL 10 MG PO TABS: 10.0000 mg | ORAL_TABLET | ORAL | 0 refills | Status: DC

## 2018-01-13 NOTE — Progress Notes (Signed)
D- Patient alert and oriented. Patient presents in a pleasant mood on assessment reporting that he slept well last night. Patient has no complaints at this time Patient denies SI, HI, AVH, and pain at this time. Patient's goal for today is "going home", in which he will accomplish this goal by "think".   A- Scheduled medications administered to patient, per MD orders. Support and encouragement provided.  Routine safety checks conducted every 15 minutes.  Patient informed to notify staff with problems or concerns.  R- No adverse drug reactions noted. Patient contracts for safety at this time. Patient compliant with medications and treatment plan. Patient receptive, calm, and cooperative. Patient interacts well with others on the unit.  Patient remains safe at this time.

## 2018-01-13 NOTE — Tx Team (Signed)
Interdisciplinary Treatment and Diagnostic Plan Update  01/13/2018 Time of Session: 11:00am Joseph Holt. MRN: [id number]  Principal Diagnosis: Paranoid schizophrenia ([place])  Secondary Diagnoses: Principal Problem:   Paranoid schizophrenia ([place]) Active Problems:   Cognitive impairment   Current Medications:  Current Facility-Administered Medications  Medication Dose Route Frequency Provider Last Rate Last Dose  . acetaminophen (TYLENOL) tablet 650 mg  650 mg Oral Q6H PRN Pucilowska, Jolanta B, MD      . alum & mag hydroxide-simeth (MAALOX/MYLANTA) 200-200-20 MG/5ML suspension 30 mL  30 mL Oral Q4H PRN Pucilowska, Jolanta B, MD   30 mL at 01/04/18 2109  . amantadine (SYMMETREL) capsule 100 mg  100 mg Oral BID Marylin Crosby, MD   100 mg at 01/13/18 0853  . chlorproMAZINE (THORAZINE) tablet 25 mg  25 mg Oral TID Pucilowska, Jolanta B, MD   25 mg at 01/13/18 0853  . divalproex (DEPAKOTE) DR tablet 500 mg  500 mg Oral Q12H McNew, Tyson Babinski, MD   500 mg at 01/13/18 0853  . donepezil (ARICEPT) tablet 10 mg  10 mg Oral Roxy Manns, Tyson Babinski, MD   10 mg at 01/13/18 0645  . feeding supplement (ENSURE ENLIVE) (ENSURE ENLIVE) liquid 237 mL  237 mL Oral BID BM McNew, Tyson Babinski, MD   Stopped at 01/12/18 1924  . gabapentin (NEURONTIN) capsule 400 mg  400 mg Oral Daily McNew, Tyson Babinski, MD   400 mg at 01/13/18 0853  . hydrOXYzine (ATARAX/VISTARIL) tablet 25 mg  25 mg Oral TID PRN Pucilowska, Jolanta B, MD      . LORazepam (ATIVAN) tablet 0.5 mg  0.5 mg Oral QHS McNew, Tyson Babinski, MD   0.5 mg at 01/12/18 2126  . magnesium hydroxide (MILK OF MAGNESIA) suspension 30 mL  30 mL Oral Daily PRN Pucilowska, Jolanta B, MD      . memantine (NAMENDA XR) 24 hr capsule 28 mg  28 mg Oral Daily McNew, Tyson Babinski, MD   28 mg at 01/13/18 0853  . mirtazapine (REMERON) tablet 30 mg  30 mg Oral QHS McNew, Holly R, MD   30 mg at 01/12/18 2126  . nicotine (NICODERM CQ - dosed in mg/24 hours) patch 21 mg  21 mg Transdermal Q0600  Pucilowska, Jolanta B, MD   21 mg at 01/13/18 7078  . pantoprazole (PROTONIX) EC tablet 40 mg  40 mg Oral Daily McNew, Tyson Babinski, MD   40 mg at 01/13/18 0853  . QUEtiapine (SEROQUEL) tablet 300 mg  300 mg Oral QHS McNew, Tyson Babinski, MD   300 mg at 01/12/18 2126  . simvastatin (ZOCOR) tablet 20 mg  20 mg Oral q1800 Pucilowska, Jolanta B, MD   20 mg at 01/12/18 1724  . tamsulosin (FLOMAX) capsule 0.4 mg  0.4 mg Oral Daily Pucilowska, Jolanta B, MD   0.4 mg at 01/13/18 0853  . vitamin B-12 (CYANOCOBALAMIN) tablet 1,000 mcg  1,000 mcg Oral Daily Chauncey Mann, MD   1,000 mcg at 01/13/18 6754   PTA Medications: Medications Prior to Admission  Medication Sig Dispense Refill Last Dose  . benztropine (COGENTIN) 0.5 MG tablet Take 0.5 mg by mouth 2 (two) times daily.   Taking  . cetirizine (ZYRTEC) 10 MG tablet Take 10 mg by mouth daily.   Taking  . Fluticasone-Salmeterol (ADVAIR) 250-50 MCG/DOSE AEPB Inhale 1 puff into the lungs 2 (two) times daily.   Taking  . guaiFENesin (MUCINEX) 600 MG 12 hr tablet Take 600 mg by mouth 2 (  two) times daily as needed.   Taking  . Icosapent Ethyl (VASCEPA) 1 g CAPS Take 2 capsules (2 g total) by mouth 2 (two) times daily. 180 capsule 1   . Nutritional Supplements (NUTRITIONAL SUPPLEMENT PO) Take 1 Can by mouth 3 (three) times daily.   Taking  . omeprazole (PRILOSEC) 20 MG capsule Take 20 mg by mouth every morning.   Taking  . promethazine (PHENERGAN) 25 MG tablet Take 25 mg by mouth every 6 (six) hours as needed for nausea or vomiting.   Taking  . Psyllium (METAMUCIL) WAFR Take 1 Wafer by mouth at bedtime.   Taking  . Vitamin D, Ergocalciferol, (DRISDOL) 50000 UNITS CAPS capsule Take 50,000 Units by mouth every 7 (seven) days.   Taking  . [DISCONTINUED] chlorproMAZINE (THORAZINE) 25 MG tablet Take 25 mg by mouth 3 (three) times daily.   Taking  . [DISCONTINUED] gabapentin (NEURONTIN) 400 MG capsule Take 400 mg by mouth every morning.   Taking  . [DISCONTINUED] LORazepam  (ATIVAN) 1 MG tablet Take 1 tablet (1 mg total) by mouth 2 (two) times daily. 180 tablet 1   . [DISCONTINUED] Memantine HCl-Donepezil HCl (NAMZARIC) 28-10 MG CP24 Take 1 capsule by mouth daily. 90 capsule 3   . [DISCONTINUED] mirtazapine (REMERON) 30 MG tablet Take 30 mg by mouth at bedtime.   Taking  . [DISCONTINUED] QUEtiapine (SEROQUEL) 400 MG tablet Take 400 mg by mouth 2 (two) times daily.    Taking  . [DISCONTINUED] simvastatin (ZOCOR) 20 MG tablet Take 20 mg by mouth at bedtime.   Taking  . [DISCONTINUED] tamsulosin (FLOMAX) 0.4 MG CAPS capsule Take 0.4 mg by mouth at bedtime.   Taking    Patient Stressors: Loss of communication with his brothers Other: Being homeless upon discharge  Patient Strengths: Ability for insight Active sense of humor Communication skills Religious Affiliation  Treatment Modalities: Medication Management, Group therapy, Case management,  1 to 1 session with clinician, Psychoeducation, Recreational therapy.   Physician Treatment Plan for Primary Diagnosis: Paranoid schizophrenia ([place]) Long Term Goal(s): Improvement in symptoms so as ready for discharge NA   Short Term Goals: Ability to identify changes in lifestyle to reduce recurrence of condition will improve Ability to verbalize feelings will improve Ability to disclose and discuss suicidal ideas Ability to demonstrate self-control will improve Ability to identify and develop effective coping behaviors will improve Ability to maintain clinical measurements within normal limits will improve Compliance with prescribed medications will improve Ability to identify triggers associated with substance abuse/mental health issues will improve NA  Medication Management: Evaluate patient's response, side effects, and tolerance of medication regimen.  Therapeutic Interventions: 1 to 1 sessions, Unit Group sessions and Medication administration.  Evaluation of Outcomes: Progressing  Physician Treatment  Plan for Secondary Diagnosis: Principal Problem:   Paranoid schizophrenia ([place]) Active Problems:   Cognitive impairment  Long Term Goal(s): Improvement in symptoms so as ready for discharge NA   Short Term Goals: Ability to identify changes in lifestyle to reduce recurrence of condition will improve Ability to verbalize feelings will improve Ability to disclose and discuss suicidal ideas Ability to demonstrate self-control will improve Ability to identify and develop effective coping behaviors will improve Ability to maintain clinical measurements within normal limits will improve Compliance with prescribed medications will improve Ability to identify triggers associated with substance abuse/mental health issues will improve NA     Medication Management: Evaluate patient's response, side effects, and tolerance of medication regimen.  Therapeutic Interventions: 1 to 1  sessions, Unit Group sessions and Medication administration.  Evaluation of Outcomes: Progressing   RN Treatment Plan for Primary Diagnosis: Paranoid schizophrenia ([place]) Long Term Goal(s): Knowledge of disease and therapeutic regimen to maintain health will improve  Short Term Goals: Ability to identify and develop effective coping behaviors will improve and Compliance with prescribed medications will improve  Medication Management: RN will administer medications as ordered by provider, will assess and evaluate patient's response and provide education to patient for prescribed medication. RN will report any adverse and/or side effects to prescribing provider.  Therapeutic Interventions: 1 on 1 counseling sessions, Psychoeducation, Medication administration, Evaluate responses to treatment, Monitor vital signs and CBGs as ordered, Perform/monitor CIWA, COWS, AIMS and Fall Risk screenings as ordered, Perform wound care treatments as ordered.  Evaluation of Outcomes: Progressing   LCSW Treatment Plan for Primary  Diagnosis: Paranoid schizophrenia ([place]) Long Term Goal(s): Safe transition to appropriate next level of care at discharge, Engage patient in therapeutic group addressing interpersonal concerns.  Short Term Goals: Engage patient in aftercare planning with referrals and resources, Identify triggers associated with mental health/substance abuse issues and Increase skills for wellness and recovery  Therapeutic Interventions: Assess for all discharge needs, 1 to 1 time with Social worker, Explore available resources and support systems, Assess for adequacy in community support network, Educate family and significant other(s) on suicide prevention, Complete Psychosocial Assessment, Interpersonal group therapy.  Evaluation of Outcomes: Progressing   Progress in Treatment: Attending groups: Yes. Participating in groups: Yes. Taking medication as prescribed: Yes. Toleration medication: Yes. Family/Significant other contact made: Yes, individual(s) contacted:  Nance Pear (Phone:[phone]  Patient understands diagnosis: Yes. Discussing patient identified problems/goals with staff: Yes. Medical problems stabilized or resolved: Yes. Denies suicidal/homicidal ideation: Yes. Issues/concerns per patient self-inventory: No. Other:    New problem(s) identified: No, Describe:     New Short Term/Long Term Goal(s):"get meds straightened out"  Discharge Plan or Barriers: Discharge to a new Family care home and follow up with Dr[facility]Ward at [facility]  Reason for Continuation of Hospitalization: Delusions  [place] Medication stabilization  Estimated Length of Stay: 1 day  Attendees: Patient:  01/13/2018 11:23 AM  Physician: Amador Cunas, MD 01/13/2018 11:23 AM  Nursing:  01/13/2018 11:23 AM  RN Care Manager: 01/13/2018 11:23 AM  Social Worker: Darin Engels, [place] 01/13/2018 11:23 AM  Recreational Therapist:  01/13/2018 11:23 AM  Other:  01/13/2018 11:23 AM  Other:  01/13/2018 11:23 AM  Other: 01/13/2018  11:23 AM    Scribe for Treatment Team: Darin Engels, LCSW 01/13/2018 11:23 AM

## 2018-01-13 NOTE — Progress Notes (Signed)
Patient slept for 7. 45 hours this shift.

## 2018-01-13 NOTE — Progress Notes (Signed)
   01/09/18 1859  PPD Results  Does patient have an induration at the injection site? No  Induration(mm) 0 mm

## 2018-01-13 NOTE — Progress Notes (Signed)
[facility] MD Progress Note  01/13/2018 10:41 AM [place].  MRN:  [id number] Subjective:  Pt states that he is doing well. HE was accepted into a group home for tomorrow. HE was very excited to hear this news and is ready to go. She is sleeping well. He states that he is eating well. HE has been caring for his ADLs. HE is fully oriented. He is a bit disorganized at times but able to redirect himself. He is able to answer most questions appropriately.   Principal Problem: Paranoid schizophrenia ([place]) Diagnosis:   Patient Active Problem List   Diagnosis Date Noted  . Paranoid schizophrenia ([place]) [F20.0] 02/21/2015    Priority: High  . Cognitive impairment [R41.89] 01/06/2018  . Tobacco use disorder [F17.200] 01/02/2018  . Social maladjustment [Z60.9] 02/21/2015   Total Time spent with patient: 15 minutes  Past Psychiatric History: See H&P  Past Medical History:  Past Medical History:  Diagnosis Date  . Bipolar 1 disorder ([place])   . Schizo affective schizophrenia [facility])     Past Surgical History:  Procedure Laterality Date  . APPENDECTOMY    . HERNIA REPAIR     Family History: History reviewed. No pertinent family history. Family Psychiatric  History: See H&P Social History:  Social History   Substance and Sexual Activity  Alcohol Use No     Social History   Substance and Sexual Activity  Drug Use No    Social History   Socioeconomic History  . Marital status: Single    Spouse name: Not on file  . Number of children: Not on file  . Years of education: Not on file  . Highest education level: Not on file  Occupational History  . Not on file  Social Needs  . Financial resource strain: Not on file  . Food insecurity:    Worry: Not on file    Inability: Not on file  . Transportation needs:    Medical: Not on file    Non-medical: Not on file  Tobacco Use  . Smoking status: Current Every Day Smoker    Packs/day: 0.20    Types: Cigarettes  . Smokeless tobacco:  Never Used  . Tobacco comment: pt smokes 1 cigarette a day  Substance and Sexual Activity  . Alcohol use: No  . Drug use: No  . Sexual activity: Not on file  Lifestyle  . Physical activity:    Days per week: Not on file    Minutes per session: Not on file  . Stress: Not on file  Relationships  . Social connections:    Talks on phone: Not on file    Gets together: Not on file    Attends religious service: Not on file    Active member of club or organization: Not on file    Attends meetings of clubs or organizations: Not on file    Relationship status: Not on file  Other Topics Concern  . Not on file  Social History Narrative  . Not on file   Additional Social History:    Pain Medications: Please see MAR Prescriptions: Please see MAR Over the Counter: Please see MAR History of alcohol / drug use?: No history of alcohol / drug abuse Longest period of sobriety (when/how long): N/A                    Sleep: Good  Appetite:  Good  Current Medications: Current Facility-Administered Medications  Medication Dose Route Frequency Provider  Last Rate Last Dose  . acetaminophen (TYLENOL) tablet 650 mg  650 mg Oral Q6H PRN Pucilowska, Jolanta B, MD      . alum & mag hydroxide-simeth (MAALOX/MYLANTA) 200-200-20 MG/5ML suspension 30 mL  30 mL Oral Q4H PRN Pucilowska, Jolanta B, MD   30 mL at 01/04/18 2109  . amantadine (SYMMETREL) capsule 100 mg  100 mg Oral BID Marylin Crosby, MD   100 mg at 01/13/18 0853  . chlorproMAZINE (THORAZINE) tablet 25 mg  25 mg Oral TID Pucilowska, Jolanta B, MD   25 mg at 01/13/18 0853  . divalproex (DEPAKOTE) DR tablet 500 mg  500 mg Oral Q12H McNew, Tyson Babinski, MD   500 mg at 01/13/18 0853  . donepezil (ARICEPT) tablet 10 mg  10 mg Oral Roxy Manns, Tyson Babinski, MD   10 mg at 01/13/18 0645  . feeding supplement (ENSURE ENLIVE) (ENSURE ENLIVE) liquid 237 mL  237 mL Oral BID BM McNew, Tyson Babinski, MD   Stopped at 01/12/18 1924  . gabapentin (NEURONTIN) capsule  400 mg  400 mg Oral Daily McNew, Tyson Babinski, MD   400 mg at 01/13/18 0853  . hydrOXYzine (ATARAX/VISTARIL) tablet 25 mg  25 mg Oral TID PRN Pucilowska, Jolanta B, MD      . LORazepam (ATIVAN) tablet 0.5 mg  0.5 mg Oral QHS McNew, Tyson Babinski, MD   0.5 mg at 01/12/18 2126  . magnesium hydroxide (MILK OF MAGNESIA) suspension 30 mL  30 mL Oral Daily PRN Pucilowska, Jolanta B, MD      . memantine (NAMENDA XR) 24 hr capsule 28 mg  28 mg Oral Daily McNew, Tyson Babinski, MD   28 mg at 01/13/18 0853  . mirtazapine (REMERON) tablet 30 mg  30 mg Oral QHS McNew, Holly R, MD   30 mg at 01/12/18 2126  . nicotine (NICODERM CQ - dosed in mg/24 hours) patch 21 mg  21 mg Transdermal Q0600 Pucilowska, Jolanta B, MD   21 mg at 01/13/18 3664  . pantoprazole (PROTONIX) EC tablet 40 mg  40 mg Oral Daily McNew, Tyson Babinski, MD   40 mg at 01/13/18 0853  . QUEtiapine (SEROQUEL) tablet 300 mg  300 mg Oral QHS McNew, Tyson Babinski, MD   300 mg at 01/12/18 2126  . simvastatin (ZOCOR) tablet 20 mg  20 mg Oral q1800 Pucilowska, Jolanta B, MD   20 mg at 01/12/18 1724  . tamsulosin (FLOMAX) capsule 0.4 mg  0.4 mg Oral Daily Pucilowska, Jolanta B, MD   0.4 mg at 01/13/18 0853  . vitamin B-12 (CYANOCOBALAMIN) tablet 1,000 mcg  1,000 mcg Oral Daily Chauncey Mann, MD   1,000 mcg at 01/13/18 4034    Lab Results: No results found for this or any previous visit (from the past 48 hour(s)).  Blood Alcohol level:  Lab Results  Component Value Date   ETH <10 01/01/2018   ETH <5 74/25/9563    Metabolic Disorder Labs: Lab Results  Component Value Date   HGBA1C 5.3 01/01/2018   MPG 105.41 01/01/2018   No results found for: PROLACTIN Lab Results  Component Value Date   CHOL 94 01/01/2018   TRIG 65 01/01/2018   HDL 26 (L) 01/01/2018   CHOLHDL 3.6 01/01/2018   VLDL 13 01/01/2018   LDLCALC 55 01/01/2018    Physical Findings: AIMS: Facial and Oral Movements Muscles of Facial Expression: None, normal Lips and Perioral Area: None, normal Jaw:  None, normal Tongue: None, normal,Extremity Movements Upper (arms, wrists,  hands, fingers): None, normal Lower (legs, knees, ankles, toes): None, normal, Trunk Movements Neck, shoulders, hips: None, normal, Overall Severity Severity of abnormal movements (highest score from questions above): None, normal Incapacitation due to abnormal movements: None, normal Patient's awareness of abnormal movements (rate only patient's report): No Awareness, Dental Status Current problems with teeth and/or dentures?: No Does patient usually wear dentures?: No  CIWA:  CIWA-Ar Total: 2 COWS:     Musculoskeletal: Strength & Muscle Tone: within normal limits Gait & Station: normal Patient leans: N/A  Psychiatric Specialty Exam: Physical Exam  Nursing note and vitals reviewed.   Review of Systems  All other systems reviewed and are negative.   Blood pressure (!) 82/60, pulse 79, temperature 98.5 F (36.9 C), resp. rate 16, height 6\' 1"  (1.854 m), weight 64 kg (141 lb), SpO2 95 %.Body mass index is 18.6 kg/m.  General Appearance: Casual  Eye Contact:  Good  Speech:  Clear and Coherent  Volume:  Normal  Mood:  Euthymic  Affect:  Appropriate  Thought Process:  Disorganized at times but overall organized  Orientation:  Full (Time, Place, and Person)  Thought Content:  Logical  Suicidal Thoughts:  No  Homicidal Thoughts:  No  Memory:  Immediate;   Fair  Judgement:  Fair  Insight:  Fair  Psychomotor Activity:  Normal  Concentration:  Concentration: Fair  Recall:  [employer] of Knowledge:  Fair  Language:  Fair  Akathisia:  No      Assets:  Resilience  ADL's:  Intact  Cognition:  Impaired,  Mild  Sleep:  Number of Hours: 7.15     Treatment Plan Summary: 67 yo male admitted after running away from group home. HE has been very calm and pleasant on the unit. He is overall organized in conversation and able to answer most questions appropriately. HE is caring well for his ADLs. He isolates  to his room but was outside yesterday.  Plan:  Schizophrenia -He is at baseline -Continue thorazine 25 mg TID -Continue amantadine 100 mg BID for tremor -Continue Seroquel 300 mg qhs. BP low today. He denies feeling dizzy -Continue Depakote 500 mg BID -Continue lower dose of Ativan 0.5 mg qhs. Goal will be to taper off completely. He can do this as an outpatient -Continue Remeron 30 mg qhs  Cognitive impairment -Continue Aricept and Namenda  Hypotension -Encourage fluids  Low b12 -Continue B12 1000 mcg daily  Dispo -He was accepted into group home. HE will discharge tomorrow Marylin Crosby, MD 01/13/2018, 10:41 AM

## 2018-01-13 NOTE — Plan of Care (Signed)
Patient has the ability to state activities that will reduce his stress level as well as the ability to identify effective coping behavior. Patient has not experienced any health-related complications thus far. Patient has been free from injury and remains safe on the unit at this time.  Problem: Education: Goal: Ability to state activities that reduce stress will improve Outcome: Progressing   Problem: Coping: Goal: Ability to identify and develop effective coping behavior will improve Outcome: Progressing   Problem: Elimination: Goal: Will not experience complications related to bowel motility Outcome: Progressing   Problem: Safety: Goal: Periods of time without injury will increase Outcome: Progressing

## 2018-01-13 NOTE — Discharge Instructions (Addendum)
Per your PCP notes, your insurance covers Namzaric which is donepezil and memantine combination medication for cognitive impairment. Our pharmacy does not have this brand, so you were given a 7 day supply of each medication (at same dose in Namzaric) until you are able to fill the prescription for Namzaric. Please contact your PCP if you have questions regarding this medication.

## 2018-01-13 NOTE — B[facility] Group Notes (Signed)
  LCSW Group Therapy Note  01/13/2018 1:00pm  Type of Therapy/Topic:  Group Therapy:  Emotion Regulation  Participation Level:  Did Not Attend   Description of Group:   The purpose of this group is to assist patients in learning to regulate negative emotions and experience positive emotions. Patients will be guided to discuss ways in which they have been vulnerable to their negative emotions. These vulnerabilities will be juxtaposed with experiences of positive emotions or situations, and patients will be challenged to use positive emotions to combat negative ones. Special emphasis will be placed on coping with negative emotions in conflict situations, and patients will process healthy conflict resolution skills.  Therapeutic Goals: 1. Patient will identify two positive emotions or experiences to reflect on in order to balance out negative emotions 2. Patient will label two or more emotions that they find the most difficult to experience 3. Patient will demonstrate positive conflict resolution skills through discussion and/or role plays  Summary of Patient Progress:       Therapeutic Modalities:   Cognitive Behavioral Therapy Feelings Identification Dialectical Behavioral Therapy   August Saucer, LCSW 01/13/2018 5:04 PM

## 2018-01-13 NOTE — BHH Counselor (Signed)
CSW spoke with Manuela Schwartz from [facility] and she accepted the patient into her family care home. She will be coming to pick up up tomorrow 01/14/2018 for discharge at [place], MSW, [place], [place] 01/13/2018 9:51 AM

## 2018-01-14 NOTE — Progress Notes (Signed)
  [facility] Adult Case Management Discharge Plan :  Will you be returning to the same living situation after discharge:  No. At discharge, do you have transportation home?: Yes,  New group home will come and transport that patient Do you have the ability to pay for your medications: Yes,  insurance  Release of information consent forms completed and in the chart;  Patient's signature needed at discharge.  Patient to Follow up at: Follow-up Information    [place] on 01/15/2018.   Why:  Please attend your follow up appointment with [facility] scheduled on Friday 01/15/2018 at 2:30PM. Thank you.   Contact information: [place] [id number] [phone]           Next level of care provider has access to [place] and Suicide Prevention discussed: Yes,  Nance Pear (Phone:[phone] Fax: [phone] ) the owner of [facility])   Have you used any form of tobacco in the last 30 days? (Cigarettes, Smokeless Tobacco, Cigars, and/or Pipes): Yes  Has patient been referred to the Quitline?: Patient refused referral  Patient has been referred for addiction treatment: N/A  Darin Engels, LCSW 01/14/2018, 10:19 AM
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
## 2018-01-14 NOTE — BHH Group Notes (Signed)
01/14/2018  Time: 1PM   Type of Therapy/Topic:  Group Therapy:  Balance in Life  Participation Level:  Did Not Attend  Description of Group:   This group will address the concept of balance and how it feels and looks when one is unbalanced. Patients will be encouraged to process areas in their lives that are out of balance and identify reasons for remaining unbalanced. Facilitators will guide patients in utilizing problem-solving interventions to address and correct the stressor making their life unbalanced. Understanding and applying boundaries will be explored and addressed for obtaining and maintaining a balanced life. Patients will be encouraged to explore ways to assertively make their unbalanced needs known to significant others in their lives, using other group members and facilitator for support and feedback.  Therapeutic Goals: 1. Patient will identify two or more emotions or situations they have that consume much of in their lives. 2. Patient will identify signs/triggers that life has become out of balance:  3. Patient will identify two ways to set boundaries in order to achieve balance in their lives:  4. Patient will demonstrate ability to communicate their needs through discussion and/or role plays  Summary of Patient Progress: Pt was invited to attend group but chose not to attend. CSW will continue to encourage pt to attend group throughout their admission.    Therapeutic Modalities:   Cognitive Behavioral Therapy Solution-Focused Therapy Assertiveness Training  Alden Hipp, MSW, LCSW Clinical Social Worker 01/14/2018 1:56 PM

## 2018-01-14 NOTE — Progress Notes (Signed)
Patient ID: Joseph Curb., male   DOB: 06-26-1951, 67 y.o.   MRN: [id number]  Discharge Note:  Patient denies SI/HI/AVH at this time. Discharge instructions, AVS, transition record, and prescriptions gone over with patient. Patient agrees to comply with medication management, follow-up visit, and outpatient therapy. Patient belongings returned to patient. Patient questions and concerns addressed and answered. Patient ambulatory off unit. Patient discharged to [facility] with [facility] staff.

## 2018-01-14 NOTE — BHH Suicide Risk Assessment (Signed)
[facility] Discharge Suicide Risk Assessment   Principal Problem: Paranoid schizophrenia [facility]) Discharge Diagnoses:  Patient Active Problem List   Diagnosis Date Noted  . Paranoid schizophrenia ([place]) [F20.0] 02/21/2015    Priority: High  . Cognitive impairment [R41.89] 01/06/2018  . Tobacco use disorder [F17.200] 01/02/2018  . Social maladjustment [Z60.9] 02/21/2015    Mental Status Per Nursing Assessment::   On Admission:     Demographic Factors:  Male, Age 67 or older, Caucasian and Unemployed  Loss Factors: NA  Historical Factors: Impulsivity  Risk Reduction Factors:   Living with another person, especially a relative, Positive social support and Positive therapeutic relationship  Continued Clinical Symptoms:  Previous Psychiatric Diagnoses and Treatments  Cognitive Features That Contribute To Risk:  Thought constriction (tunnel vision)    Suicide Risk:  Minimal: No identifiable suicidal ideation.    Follow-up Information    [place] on 01/15/2018.   Why:  Please attend your follow up appointment with [facility] scheduled on Friday 01/15/2018 at 2:30PM. Thank you.   Contact information: [place] [address] [phone]           Plan Of Care/Follow-up recommendations: Follow up with [place], MD 01/14/2018, 9:30 AM

## 2018-01-14 NOTE — BHH Counselor (Signed)
CSW faxed over discharge FL2, RSVP number and previous group home information to the patients new group home.  Darin Engels, MSW, [place], LCASA 01/14/2018 10:58 AM

## 2018-01-14 NOTE — BHH Group Notes (Signed)
LCSW Group Therapy Note 01/14/2018 9:00 AM  Type of Therapy and Topic:  Group Therapy:  Setting Goals  Participation Level:  Did Not Attend  Description of Group: In this process group, patients discussed using strengths to work toward goals and address challenges.  Patients identified two positive things about themselves and one goal they were working on.  Patients were given the opportunity to share openly and support each other's plan for self-empowerment.  The group discussed the value of gratitude and were encouraged to have a daily reflection of positive characteristics or circumstances.  Patients were encouraged to identify a plan to utilize their strengths to work on current challenges and goals.  Therapeutic Goals 1. Patient will verbalize personal strengths/positive qualities and relate how these can assist with achieving desired personal goals 2. Patients will verbalize affirmation of peers plans for personal change and goal setting 3. Patients will explore the value of gratitude and positive focus as related to successful achievement of goals 4. Patients will verbalize a plan for regular reinforcement of personal positive qualities and circumstances.  Summary of Patient Progress:   Joseph Holt was invited to today's group, but chose not to attend.    Therapeutic Modalities Cognitive Behavioral Therapy Motivational Interviewing    Joseph Holt, [place] 01/14/2018 2:08 PM

## 2018-01-14 NOTE — Plan of Care (Signed)
  Problem: Education: Goal: Ability to state activities that reduce stress will improve Outcome: Progressing   Problem: Coping: Goal: Ability to identify and develop effective coping behavior will improve Outcome: Progressing   Problem: Elimination: Goal: Will not experience complications related to bowel motility Outcome: Progressing   Problem: Safety: Goal: Periods of time without injury will increase Outcome: Progressing

## 2018-01-14 NOTE — Progress Notes (Signed)
D : Patient is alert and oriented x 4, denies SI/HI/AVH, affect is blunted,  mood is pleasant and he is cooperative with nursing plan. Pt appears less anxious and he is interacting with peers and staff appropriately. Patient's thoughts are organized.  A: Pt was offered support and encouraged to attend evening wrap up group. 15 minutes checks were done for safety.  R:Pt attends evening wrap up group and interacts well with peers and staff. Patient is complaint with medication. Safety maintained on unit will continue to monitor.

## 2018-01-14 NOTE — Progress Notes (Signed)
Recreation Therapy Notes  Date: 01/14/2018  Time: 9:30 am   Location: Craft Room   Behavioral response: N/A   Intervention Topic: Self-care   Discussion/Intervention: Patient did not attend group.   Clinical Observations/Feedback:  Patient did not attend group.          Jaquis Picklesimer 01/14/2018 11:39 AM

## 2018-01-14 NOTE — NC FL2 (Signed)
[place] LEVEL OF CARE SCREENING TOOL     IDENTIFICATION  Patient Name: Joseph Holt. Birthdate: 1951/02/08 Sex: male Admission Date (Current Location): 01/02/2018  [place] and [place] Number:  Engineering geologist and Address:  [facility], [address], [place], [place] [address]      Provider Number: [id number]  Attending Physician Name and Address:  Marylin Crosby, MD  Relative Name and Phone Number:  Dreon Pineda- Brother- [phone]    Current Level of Care: Hospital Recommended Level of Care: [facility] Prior Approval Number:    Date Approved/Denied:   PASRR Number: RSVP number: [id number]  Discharge Plan: Domiciliary (Rest home)    Current Diagnoses: Patient Active Problem List   Diagnosis Date Noted  . Cognitive impairment 01/06/2018  . Tobacco use disorder 01/02/2018  . Paranoid schizophrenia ([place]) 02/21/2015  . Social maladjustment 02/21/2015    Orientation RESPIRATION BLADDER Height & Weight     Self, Time, Situation, Place  Normal Continent Weight: 141 lb (64 kg) Height:  6\' 1"  (185.4 cm)  BEHAVIORAL SYMPTOMS/MOOD NEUROLOGICAL BOWEL NUTRITION STATUS  (N/A) (N/A) Continent Diet(Regualr)  AMBULATORY STATUS COMMUNICATION OF NEEDS Skin   Independent Verbally Normal                       Personal Care Assistance Level of Assistance  Feeding, Dressing, Total care, Bathing Bathing Assistance: Limited assistance(Needs assistance getting in and out of the bathtub) Feeding assistance: Independent Dressing Assistance: Independent Total Care Assistance: Independent   Functional Limitations Info  Sight, Hearing, Speech Sight Info: Adequate Hearing Info: Adequate Speech Info: Adequate    SPECIAL CARE FACTORS FREQUENCY  (Mild Cognative Impairment)                    Contractures Contractures Info: Not present    Additional Factors Info  Psychotropic     Psychotropic Info:  Psycotropic medications         Current Medications (01/14/2018):  This is the current hospital active medication list Current Facility-Administered Medications  Medication Dose Route Frequency Provider Last Rate Last Dose  . acetaminophen (TYLENOL) tablet 650 mg  650 mg Oral Q6H PRN Pucilowska, Jolanta B, MD      . alum & mag hydroxide-simeth (MAALOX/MYLANTA) 200-200-20 MG/5ML suspension 30 mL  30 mL Oral Q4H PRN Pucilowska, Jolanta B, MD   30 mL at 01/04/18 2109  . amantadine (SYMMETREL) capsule 100 mg  100 mg Oral BID Marylin Crosby, MD   100 mg at 01/13/18 1731  . chlorproMAZINE (THORAZINE) tablet 25 mg  25 mg Oral TID Pucilowska, Jolanta B, MD   25 mg at 01/13/18 1731  . divalproex (DEPAKOTE) DR tablet 500 mg  500 mg Oral Q12H McNew, Tyson Babinski, MD   500 mg at 01/13/18 2100  . donepezil (ARICEPT) tablet 10 mg  10 mg Oral Roxy Manns, Tyson Babinski, MD   10 mg at 01/14/18 0645  . feeding supplement (ENSURE ENLIVE) (ENSURE ENLIVE) liquid 237 mL  237 mL Oral BID BM McNew, Holly R, MD   237 mL at 01/13/18 1500  . gabapentin (NEURONTIN) capsule 400 mg  400 mg Oral Daily McNew, Tyson Babinski, MD   400 mg at 01/13/18 0853  . hydrOXYzine (ATARAX/VISTARIL) tablet 25 mg  25 mg Oral TID PRN Pucilowska, Jolanta B, MD      . LORazepam (ATIVAN) tablet 0.5 mg  0.5 mg Oral QHS McNew, Holly R,  MD   0.5 mg at 01/13/18 2207  . magnesium hydroxide (MILK OF MAGNESIA) suspension 30 mL  30 mL Oral Daily PRN Pucilowska, Jolanta B, MD      . memantine (NAMENDA XR) 24 hr capsule 28 mg  28 mg Oral Daily McNew, Tyson Babinski, MD   28 mg at 01/13/18 0853  . mirtazapine (REMERON) tablet 30 mg  30 mg Oral QHS McNew, Tyson Babinski, MD   30 mg at 01/13/18 2207  . nicotine (NICODERM CQ - dosed in mg/24 hours) patch 21 mg  21 mg Transdermal Q0600 Pucilowska, Jolanta B, MD   21 mg at 01/14/18 0646  . pantoprazole (PROTONIX) EC tablet 40 mg  40 mg Oral Daily McNew, Tyson Babinski, MD   40 mg at 01/13/18 0853  . QUEtiapine (SEROQUEL) tablet 300 mg  300 mg  Oral QHS McNew, Tyson Babinski, MD   300 mg at 01/13/18 2207  . simvastatin (ZOCOR) tablet 20 mg  20 mg Oral q1800 Pucilowska, Jolanta B, MD   20 mg at 01/13/18 1731  . tamsulosin (FLOMAX) capsule 0.4 mg  0.4 mg Oral Daily Pucilowska, Jolanta B, MD   0.4 mg at 01/13/18 0853  . vitamin B-12 (CYANOCOBALAMIN) tablet 1,000 mcg  1,000 mcg Oral Daily Chauncey Mann, MD   1,000 mcg at 01/13/18 9381     Discharge Medications: Please see discharge summary for a list of discharge medications.  Relevant Imaging Results:  Relevant Lab Results:   Additional Information N/A  Darin Engels, LCSW

## 2018-01-20 ENCOUNTER — Ambulatory Visit (INDEPENDENT_AMBULATORY_CARE_PROVIDER_SITE_OTHER): Payer: Medicare Other | Admitting: Physician Assistant

## 2018-01-20 ENCOUNTER — Encounter: Payer: Self-pay | Admitting: Physician Assistant

## 2018-01-20 VITALS — BP 102/60 | HR 93 | Temp 97.7°F | Resp 16 | Wt 151.8 lb

## 2018-01-20 DIAGNOSIS — E781 Pure hyperglyceridemia: Secondary | ICD-10-CM

## 2018-01-20 DIAGNOSIS — F209 Schizophrenia, unspecified: Secondary | ICD-10-CM | POA: Diagnosis not present

## 2018-01-20 DIAGNOSIS — K219 Gastro-esophageal reflux disease without esophagitis: Secondary | ICD-10-CM | POA: Diagnosis not present

## 2018-01-20 DIAGNOSIS — N401 Enlarged prostate with lower urinary tract symptoms: Secondary | ICD-10-CM

## 2018-01-20 DIAGNOSIS — J418 Mixed simple and mucopurulent chronic bronchitis: Secondary | ICD-10-CM

## 2018-01-20 DIAGNOSIS — G629 Polyneuropathy, unspecified: Secondary | ICD-10-CM

## 2018-01-20 DIAGNOSIS — R251 Tremor, unspecified: Secondary | ICD-10-CM

## 2018-01-20 DIAGNOSIS — E559 Vitamin D deficiency, unspecified: Secondary | ICD-10-CM | POA: Diagnosis not present

## 2018-01-20 DIAGNOSIS — R3914 Feeling of incomplete bladder emptying: Secondary | ICD-10-CM

## 2018-01-20 DIAGNOSIS — R4189 Other symptoms and signs involving cognitive functions and awareness: Secondary | ICD-10-CM

## 2018-01-20 DIAGNOSIS — E7800 Pure hypercholesterolemia, unspecified: Secondary | ICD-10-CM

## 2018-01-20 DIAGNOSIS — L509 Urticaria, unspecified: Secondary | ICD-10-CM

## 2018-01-20 DIAGNOSIS — F0281 Dementia in other diseases classified elsewhere with behavioral disturbance: Secondary | ICD-10-CM

## 2018-01-20 DIAGNOSIS — E538 Deficiency of other specified B group vitamins: Secondary | ICD-10-CM

## 2018-01-20 DIAGNOSIS — F02818 Dementia in other diseases classified elsewhere, unspecified severity, with other behavioral disturbance: Secondary | ICD-10-CM

## 2018-01-20 DIAGNOSIS — F200 Paranoid schizophrenia: Secondary | ICD-10-CM | POA: Diagnosis not present

## 2018-01-20 MED ORDER — FLUTICASONE-SALMETEROL 250-50 MCG/DOSE IN AEPB: 1.0000 | INHALATION_SPRAY | Freq: Two times a day (BID) | RESPIRATORY_TRACT | 1 refills | Status: DC

## 2018-01-20 MED ORDER — QUETIAPINE FUMARATE 300 MG PO TABS: 300.0000 mg | ORAL_TABLET | Freq: Every day | ORAL | 1 refills | Status: DC

## 2018-01-20 MED ORDER — TAMSULOSIN HCL 0.4 MG PO CAPS: 0.4000 mg | ORAL_CAPSULE | Freq: Every day | ORAL | 1 refills | Status: DC

## 2018-01-20 MED ORDER — ICOSAPENT ETHYL 1 G PO CAPS: 2.0000 | ORAL_CAPSULE | Freq: Two times a day (BID) | ORAL | 1 refills | Status: DC

## 2018-01-20 MED ORDER — GABAPENTIN 400 MG PO CAPS: 400.0000 mg | ORAL_CAPSULE | Freq: Every morning | ORAL | 1 refills | Status: DC

## 2018-01-20 MED ORDER — CETIRIZINE HCL 10 MG PO TABS: 10.0000 mg | ORAL_TABLET | Freq: Every day | ORAL | 1 refills | Status: DC

## 2018-01-20 MED ORDER — DIVALPROEX SODIUM 500 MG PO DR TAB: 500.0000 mg | DELAYED_RELEASE_TABLET | Freq: Two times a day (BID) | ORAL | 1 refills | Status: DC

## 2018-01-20 MED ORDER — MEMANTINE HCL-DONEPEZIL HCL ER 28-10 MG PO CP24: 1.0000 | ORAL_CAPSULE | Freq: Every day | ORAL | 1 refills | Status: DC

## 2018-01-20 MED ORDER — VITAMIN D (ERGOCALCIFEROL) 1.25 MG (50000 UNIT) PO CAPS: 50000.0000 [IU] | ORAL_CAPSULE | ORAL | 1 refills | Status: DC

## 2018-01-20 MED ORDER — AMANTADINE HCL 100 MG PO CAPS: 100.0000 mg | ORAL_CAPSULE | Freq: Two times a day (BID) | ORAL | 1 refills | Status: DC

## 2018-01-20 MED ORDER — CHLORPROMAZINE HCL 25 MG PO TABS: 25.0000 mg | ORAL_TABLET | Freq: Three times a day (TID) | ORAL | 1 refills | Status: DC

## 2018-01-20 MED ORDER — LORAZEPAM 0.5 MG PO TABS: 0.5000 mg | ORAL_TABLET | Freq: Every day | ORAL | 1 refills | Status: DC

## 2018-01-20 MED ORDER — OMEPRAZOLE 20 MG PO CPDR: 20.0000 mg | DELAYED_RELEASE_CAPSULE | Freq: Every morning | ORAL | 1 refills | Status: DC

## 2018-01-20 MED ORDER — SIMVASTATIN 20 MG PO TABS: 20.0000 mg | ORAL_TABLET | Freq: Every day | ORAL | 1 refills | Status: DC

## 2018-01-20 MED ORDER — MIRTAZAPINE 30 MG PO TABS: 30.0000 mg | ORAL_TABLET | Freq: Every day | ORAL | 1 refills | Status: DC

## 2018-01-20 MED ORDER — CYANOCOBALAMIN 1000 MCG PO TABS: 1000.0000 ug | ORAL_TABLET | Freq: Every day | ORAL | 1 refills | Status: DC

## 2018-01-20 NOTE — Progress Notes (Signed)
Patient: Joseph Holt. Male    DOB: 1951/01/15   67 y.o.   MRN: [id number] Visit Date: 01/20/2018  Today's Provider: Mar Daring, PA-C   Chief Complaint  Patient presents with  . Hospitalization Follow-up   Subjective:    HPI  Follow up Hospitalization  Patient was admitted to [facility] on 01/02/18 and discharged on 01/02/18. Patient also went to the ED on 12/22/17 He was treated for Bipolar 1 Disorder and Schizo affective Schizophrenia. Treatment for this included changing cogentin to amantadine. Seroquel was changed to 300mg  IR. Lorazepam was decreased to 0.5mg  at bedtime. All other medications remained the same.  He reports good compliance with treatment. He is now residing in a new group home and has been there for about 4 days. He reports good interactions and feels he is better fit for this new house. Patient was communicative today with clear thoughts. New group home is requesting refills on all medications.  ------------------------------------------------------------------------------------       No Known Allergies   Current Outpatient Medications:  .  amantadine (SYMMETREL) 100 MG capsule, Take 1 capsule (100 mg total) by mouth 2 (two) times daily., Disp: 60 capsule, Rfl: 0 .  chlorproMAZINE (THORAZINE) 25 MG tablet, Take 1 tablet (25 mg total) by mouth 3 (three) times daily., Disp: 90 tablet, Rfl: 0 .  cyanocobalamin 1000 MCG tablet, Take 1 tablet (1,000 mcg total) by mouth daily., Disp: 30 tablet, Rfl: 0 .  divalproex (DEPAKOTE) 500 MG DR tablet, Take 1 tablet (500 mg total) by mouth every 12 (twelve) hours., Disp: 60 tablet, Rfl: 0 .  gabapentin (NEURONTIN) 400 MG capsule, Take 1 capsule (400 mg total) by mouth every morning., Disp: 30 capsule, Rfl: 0 .  LORazepam (ATIVAN) 0.5 MG tablet, Take 1 tablet (0.5 mg total) by mouth at bedtime., Disp: 30 tablet, Rfl: 0 .  Memantine HCl-Donepezil HCl (NAMZARIC) 28-10 MG CP24, Take 1 capsule by mouth daily.,  Disp: 90 capsule, Rfl: 1 .  mirtazapine (REMERON) 30 MG tablet, Take 1 tablet (30 mg total) by mouth at bedtime., Disp: 30 tablet, Rfl: 0 .  QUEtiapine (SEROQUEL) 300 MG tablet, Take 1 tablet (300 mg total) by mouth at bedtime., Disp: 30 tablet, Rfl: 0 .  simvastatin (ZOCOR) 20 MG tablet, Take 1 tablet (20 mg total) by mouth at bedtime., Disp: 30 tablet, Rfl: 0 .  tamsulosin (FLOMAX) 0.4 MG CAPS capsule, Take 1 capsule (0.4 mg total) by mouth at bedtime., Disp: 30 capsule, Rfl: 0 .  cetirizine (ZYRTEC) 10 MG tablet, Take 10 mg by mouth daily., Disp: , Rfl:  .  Fluticasone-Salmeterol (ADVAIR) 250-50 MCG/DOSE AEPB, Inhale 1 puff into the lungs 2 (two) times daily., Disp: , Rfl:  .  Icosapent Ethyl (VASCEPA) 1 g CAPS, Take 2 capsules (2 g total) by mouth 2 (two) times daily., Disp: 180 capsule, Rfl: 1 .  omeprazole (PRILOSEC) 20 MG capsule, Take 20 mg by mouth every morning., Disp: , Rfl:  .  Vitamin D, Ergocalciferol, (DRISDOL) 50000 UNITS CAPS capsule, Take 50,000 Units by mouth every 7 (seven) days., Disp: , Rfl:   Review of Systems  Constitutional: Negative.   Respiratory: Negative.   Cardiovascular: Negative.   Gastrointestinal: Negative.   Neurological: Positive for tremors. Negative for seizures, numbness and headaches.  Psychiatric/Behavioral: Positive for hallucinations. Negative for agitation, behavioral problems, decreased concentration, dysphoric mood, self-injury, sleep disturbance and suicidal ideas. The patient is not nervous/anxious.     Social History  Tobacco Use  . Smoking status: Current Every Day Smoker    Packs/day: 0.20    Types: Cigarettes  . Smokeless tobacco: Never Used  . Tobacco comment: pt smokes 1 cigarette a day  Substance Use Topics  . Alcohol use: No   Objective:   BP 102/60 (BP Location: Left Arm, Patient Position: Sitting, Cuff Size: Normal)   Pulse 93   Temp 97.7 F (36.5 C) (Oral)   Resp 16   Wt 151 lb 12.8 oz (68.9 kg)   SpO2 98%   BMI 20.03  kg/m    Physical Exam  Constitutional: He appears well-developed and well-nourished. No distress.  HENT:  Head: Normocephalic and atraumatic.  Neck: Normal range of motion. Neck supple.  Cardiovascular: Normal rate, regular rhythm and normal heart sounds. Exam reveals no gallop and no friction rub.  No murmur heard. Pulmonary/Chest: Effort normal and breath sounds normal. No respiratory distress. He has no wheezes. He has no rales.  Musculoskeletal: He exhibits no edema.  Skin: He is not diaphoretic.  Vitals reviewed.      Assessment & Plan:     1. Dementia associated with other underlying disease with behavioral disturbance Stable. Diagnosis pulled for medication refill. Continue current medical treatment plan. - Memantine HCl-Donepezil HCl (NAMZARIC) 28-10 MG CP24; Take 1 capsule by mouth daily.  Dispense: 90 capsule; Refill: 1  2. Chronic schizophrenia ([place]) Stable with current medications.   3. Hypercholesteremia Stable. Diagnosis pulled for medication refill. Continue current medical treatment plan. - simvastatin (ZOCOR) 20 MG tablet; Take 1 tablet (20 mg total) by mouth at bedtime.  Dispense: 90 tablet; Refill: 1 - Icosapent Ethyl (VASCEPA) 1 g CAPS; Take 2 capsules (2 g total) by mouth 2 (two) times daily.  Dispense: 180 capsule; Refill: 1  4. Hypertriglyceridemia Stable. Diagnosis pulled for medication refill. Continue current medical treatment plan. - simvastatin (ZOCOR) 20 MG tablet; Take 1 tablet (20 mg total) by mouth at bedtime.  Dispense: 90 tablet; Refill: 1 - Icosapent Ethyl (VASCEPA) 1 g CAPS; Take 2 capsules (2 g total) by mouth 2 (two) times daily.  Dispense: 180 capsule; Refill: 1  5. Cognitive impairment Stable. Diagnosis pulled for medication refill. Continue current medical treatment plan. - divalproex (DEPAKOTE) 500 MG DR tablet; Take 1 tablet (500 mg total) by mouth every 12 (twelve) hours.  Dispense: 90 tablet; Refill: 1 - chlorproMAZINE (THORAZINE)  25 MG tablet; Take 1 tablet (25 mg total) by mouth 3 (three) times daily.  Dispense: 90 tablet; Refill: 1  6. Paranoid schizophrenia ([place]) Stable. Diagnosis pulled for medication refill. Continue current medical treatment plan. - QUEtiapine (SEROQUEL) 300 MG tablet; Take 1 tablet (300 mg total) by mouth at bedtime.  Dispense: 90 tablet; Refill: 1 - mirtazapine (REMERON) 30 MG tablet; Take 1 tablet (30 mg total) by mouth at bedtime.  Dispense: 90 tablet; Refill: 1 - LORazepam (ATIVAN) 0.5 MG tablet; Take 1 tablet (0.5 mg total) by mouth at bedtime.  Dispense: 90 tablet; Refill: 1 - divalproex (DEPAKOTE) 500 MG DR tablet; Take 1 tablet (500 mg total) by mouth every 12 (twelve) hours.  Dispense: 90 tablet; Refill: 1 - chlorproMAZINE (THORAZINE) 25 MG tablet; Take 1 tablet (25 mg total) by mouth 3 (three) times daily.  Dispense: 90 tablet; Refill: 1  7. Mixed simple and mucopurulent chronic bronchitis (HCC) Stable. Diagnosis pulled for medication refill. Continue current medical treatment plan. - Fluticasone-Salmeterol (ADVAIR) 250-50 MCG/DOSE AEPB; Inhale 1 puff into the lungs 2 (two) times daily.  Dispense:  180 each; Refill: 1  8. Tremor Stable. Diagnosis pulled for medication refill. Continue current medical treatment plan. - amantadine (SYMMETREL) 100 MG capsule; Take 1 capsule (100 mg total) by mouth 2 (two) times daily.  Dispense: 180 capsule; Refill: 1  9. Urticaria Stable. Diagnosis pulled for medication refill. Continue current medical treatment plan. - cetirizine (ZYRTEC) 10 MG tablet; Take 1 tablet (10 mg total) by mouth daily.  Dispense: 90 tablet; Refill: 1  10. Vitamin D deficiency Stable. Diagnosis pulled for medication refill. Continue current medical treatment plan. - Vitamin D, Ergocalciferol, (DRISDOL) 50000 units CAPS capsule; Take 1 capsule (50,000 Units total) by mouth every 7 (seven) days.  Dispense: 12 capsule; Refill: 1  11. Benign prostatic hyperplasia with incomplete  bladder emptying Stable. Diagnosis pulled for medication refill. Continue current medical treatment plan. - tamsulosin (FLOMAX) 0.4 MG CAPS capsule; Take 1 capsule (0.4 mg total) by mouth at bedtime.  Dispense: 90 capsule; Refill: 1  12. Gastroesophageal reflux disease without esophagitis Stable. Diagnosis pulled for medication refill. Continue current medical treatment plan. - omeprazole (PRILOSEC) 20 MG capsule; Take 1 capsule (20 mg total) by mouth every morning.  Dispense: 90 capsule; Refill: 1  13. Neuropathy Stable. Diagnosis pulled for medication refill. Continue current medical treatment plan. - gabapentin (NEURONTIN) 400 MG capsule; Take 1 capsule (400 mg total) by mouth every morning.  Dispense: 90 capsule; Refill: 1  14. B12 deficiency Stable. Diagnosis pulled for medication refill. Continue current medical treatment plan. - cyanocobalamin 1000 MCG tablet; Take 1 tablet (1,000 mcg total) by mouth daily.  Dispense: 90 tablet; Refill: [place], PA-C  [facility]

## 2018-02-25 ENCOUNTER — Ambulatory Visit: Payer: Self-pay | Admitting: Physician Assistant

## 2018-02-25 DIAGNOSIS — J449 Chronic obstructive pulmonary disease, unspecified: Secondary | ICD-10-CM | POA: Diagnosis not present

## 2018-02-25 DIAGNOSIS — F419 Anxiety disorder, unspecified: Secondary | ICD-10-CM | POA: Diagnosis not present

## 2018-02-25 DIAGNOSIS — Z716 Tobacco abuse counseling: Secondary | ICD-10-CM | POA: Diagnosis not present

## 2018-02-25 DIAGNOSIS — E7800 Pure hypercholesterolemia, unspecified: Secondary | ICD-10-CM | POA: Diagnosis not present

## 2018-02-25 DIAGNOSIS — D649 Anemia, unspecified: Secondary | ICD-10-CM | POA: Diagnosis not present

## 2018-02-25 DIAGNOSIS — N400 Enlarged prostate without lower urinary tract symptoms: Secondary | ICD-10-CM | POA: Diagnosis not present

## 2018-02-25 DIAGNOSIS — E7849 Other hyperlipidemia: Secondary | ICD-10-CM | POA: Diagnosis not present

## 2018-02-25 DIAGNOSIS — E559 Vitamin D deficiency, unspecified: Secondary | ICD-10-CM | POA: Diagnosis not present

## 2018-02-25 DIAGNOSIS — Z0000 Encounter for general adult medical examination without abnormal findings: Secondary | ICD-10-CM | POA: Diagnosis not present

## 2018-02-25 DIAGNOSIS — Z1389 Encounter for screening for other disorder: Secondary | ICD-10-CM | POA: Diagnosis not present

## 2018-03-03 DIAGNOSIS — F3112 Bipolar disorder, current episode manic without psychotic features, moderate: Secondary | ICD-10-CM | POA: Diagnosis not present

## 2018-03-31 ENCOUNTER — Ambulatory Visit: Payer: Self-pay | Admitting: Physician Assistant

## 2018-04-12 ENCOUNTER — Encounter: Payer: Self-pay | Admitting: Physician Assistant

## 2018-04-12 ENCOUNTER — Ambulatory Visit (INDEPENDENT_AMBULATORY_CARE_PROVIDER_SITE_OTHER): Payer: Medicare Other | Admitting: Physician Assistant

## 2018-04-12 VITALS — BP 96/60 | HR 86 | Temp 97.7°F | Resp 16 | Wt 143.4 lb

## 2018-04-12 DIAGNOSIS — F319 Bipolar disorder, unspecified: Secondary | ICD-10-CM

## 2018-04-12 DIAGNOSIS — F250 Schizoaffective disorder, bipolar type: Secondary | ICD-10-CM

## 2018-04-12 DIAGNOSIS — F259 Schizoaffective disorder, unspecified: Secondary | ICD-10-CM

## 2018-04-12 DIAGNOSIS — F200 Paranoid schizophrenia: Secondary | ICD-10-CM | POA: Diagnosis not present

## 2018-04-12 NOTE — Progress Notes (Signed)
Patient: Joseph Holt. Male    DOB: 06/14/1951   67 y.o.   MRN: [id number] Visit Date: 04/12/2018  Today's Provider: Mar Daring, PA-C   Chief Complaint  Patient presents with  . Follow-up    labs   Subjective:    HPI   Patient is here today to follow-up labs and other chronic problems. Patient missed an appointment here because he was taken to [facility] by mistake and his Zocor was decreased by provider there. Labs were also checked at this office. He refuses labs today. Will attempt to get lab results. He is in a new group home and per staff he is doing well. Patient agrees he feels more comfortable in the home he is now in.      No Known Allergies   Current Outpatient Medications:  .  amantadine (SYMMETREL) 100 MG capsule, Take 1 capsule (100 mg total) by mouth 2 (two) times daily., Disp: 180 capsule, Rfl: 1 .  cetirizine (ZYRTEC) 10 MG tablet, Take 1 tablet (10 mg total) by mouth daily., Disp: 90 tablet, Rfl: 1 .  chlorproMAZINE (THORAZINE) 25 MG tablet, Take 1 tablet (25 mg total) by mouth 3 (three) times daily., Disp: 90 tablet, Rfl: 1 .  cyanocobalamin 1000 MCG tablet, Take 1 tablet (1,000 mcg total) by mouth daily., Disp: 90 tablet, Rfl: 1 .  divalproex (DEPAKOTE) 500 MG DR tablet, Take 1 tablet (500 mg total) by mouth every 12 (twelve) hours., Disp: 90 tablet, Rfl: 1 .  Fluticasone-Salmeterol (ADVAIR) 250-50 MCG/DOSE AEPB, Inhale 1 puff into the lungs 2 (two) times daily., Disp: 180 each, Rfl: 1 .  gabapentin (NEURONTIN) 400 MG capsule, Take 1 capsule (400 mg total) by mouth every morning., Disp: 90 capsule, Rfl: 1 .  Icosapent Ethyl (VASCEPA) 1 g CAPS, Take 2 capsules (2 g total) by mouth 2 (two) times daily., Disp: 180 capsule, Rfl: 1 .  LORazepam (ATIVAN) 0.5 MG tablet, Take 1 tablet (0.5 mg total) by mouth at bedtime., Disp: 90 tablet, Rfl: 1 .  Memantine HCl-Donepezil HCl (NAMZARIC) 28-10 MG CP24, Take 1 capsule by mouth daily.,  Disp: 90 capsule, Rfl: 1 .  mirtazapine (REMERON) 30 MG tablet, Take 1 tablet (30 mg total) by mouth at bedtime., Disp: 90 tablet, Rfl: 1 .  omeprazole (PRILOSEC) 20 MG capsule, Take 1 capsule (20 mg total) by mouth every morning., Disp: 90 capsule, Rfl: 1 .  QUEtiapine (SEROQUEL) 300 MG tablet, Take 1 tablet (300 mg total) by mouth at bedtime., Disp: 90 tablet, Rfl: 1 .  simvastatin (ZOCOR) 20 MG tablet, Take 1 tablet (20 mg total) by mouth at bedtime., Disp: 90 tablet, Rfl: 1 .  tamsulosin (FLOMAX) 0.4 MG CAPS capsule, Take 1 capsule (0.4 mg total) by mouth at bedtime., Disp: 90 capsule, Rfl: 1 .  Vitamin D, Ergocalciferol, (DRISDOL) 50000 units CAPS capsule, Take 1 capsule (50,000 Units total) by mouth every 7 (seven) days., Disp: 12 capsule, Rfl: 1  Review of Systems  Constitutional: Negative.   Respiratory: Negative.   Cardiovascular: Negative.   Gastrointestinal: Negative.   Psychiatric/Behavioral: Positive for agitation, confusion, decreased concentration, dysphoric mood and hallucinations. Negative for self-injury, sleep disturbance and suicidal ideas. The patient is nervous/anxious.     Social History   Tobacco Use  . Smoking status: Current Every Day Smoker    Packs/day: 0.20    Types: Cigarettes  . Smokeless tobacco: Never Used  . Tobacco comment: pt smokes 1 cigarette  a day  Substance Use Topics  . Alcohol use: No   Objective:   Pulse 86   Temp 97.7 F (36.5 C) (Oral)   Resp 16   Wt 143 lb 6.4 oz (65 kg)   SpO2 97%   BMI 18.92 kg/m    Physical Exam  Constitutional: He appears well-developed and well-nourished. No distress.  HENT:  Head: Normocephalic and atraumatic.  Neck: Normal range of motion. Neck supple.  Cardiovascular: Normal rate, regular rhythm and normal heart sounds. Exam reveals no gallop and no friction rub.  No murmur heard. Pulmonary/Chest: Effort normal and breath sounds normal. No respiratory distress. He has no wheezes. He has no rales.    Skin: He is not diaphoretic.  Psychiatric: His behavior is normal. Judgment normal. His mood appears anxious. His speech is rapid and/or pressured. He is actively hallucinating. Thought content is delusional. Thought content is not paranoid. Cognition and memory are normal. He expresses no homicidal and no suicidal ideation.  Vitals reviewed.      Assessment & Plan:     1. Paranoid schizophrenia ([place]) Continue all medications as prescribed. Will review previous labs from [facility] once received, request has been made. I will see him back in 6 months. They are to call if any changes or acute issue arise.   2. Bipolar 1 disorder ([place]) See above medical treatment plan.  3. Schizo affective schizophrenia ([place]) See above medical treatment plan.       Mar Daring, PA-C  [facility]

## 2018-04-14 ENCOUNTER — Encounter: Payer: Self-pay | Admitting: Physician Assistant

## 2018-04-14 DIAGNOSIS — F259 Schizoaffective disorder, unspecified: Secondary | ICD-10-CM | POA: Insufficient documentation

## 2018-04-14 DIAGNOSIS — F250 Schizoaffective disorder, bipolar type: Secondary | ICD-10-CM

## 2018-04-14 DIAGNOSIS — F319 Bipolar disorder, unspecified: Secondary | ICD-10-CM | POA: Insufficient documentation

## 2018-04-26 ENCOUNTER — Other Ambulatory Visit: Payer: Self-pay | Admitting: Physician Assistant

## 2018-04-26 DIAGNOSIS — F200 Paranoid schizophrenia: Secondary | ICD-10-CM

## 2018-04-26 DIAGNOSIS — R4189 Other symptoms and signs involving cognitive functions and awareness: Secondary | ICD-10-CM

## 2018-05-04 ENCOUNTER — Telehealth: Payer: Self-pay | Admitting: Physician Assistant

## 2018-05-04 DIAGNOSIS — E7800 Pure hypercholesterolemia, unspecified: Secondary | ICD-10-CM

## 2018-05-04 DIAGNOSIS — E781 Pure hyperglyceridemia: Secondary | ICD-10-CM

## 2018-05-04 NOTE — Telephone Encounter (Signed)
[facility] pharmacy faxed a refill request for the following medication. Thanks CC  Icosapent Ethyl (VASCEPA) 1 g CAPS

## 2018-05-05 MED ORDER — ICOSAPENT ETHYL 1 G PO CAPS: 2.0000 | ORAL_CAPSULE | Freq: Two times a day (BID) | ORAL | 1 refills | Status: DC

## 2018-05-05 NOTE — Telephone Encounter (Signed)
Sent in

## 2018-05-12 DIAGNOSIS — F331 Major depressive disorder, recurrent, moderate: Secondary | ICD-10-CM | POA: Diagnosis not present

## 2018-05-21 ENCOUNTER — Encounter: Payer: Self-pay | Admitting: Emergency Medicine

## 2018-05-21 ENCOUNTER — Emergency Department
Admission: EM | Admit: 2018-05-21 | Discharge: 2018-05-21 | Disposition: A | Discharge status: Home / Self Care | Payer: Medicare Other | Attending: Emergency Medicine | Admitting: Emergency Medicine

## 2018-05-21 ENCOUNTER — Other Ambulatory Visit: Payer: Self-pay

## 2018-05-21 DIAGNOSIS — F17210 Nicotine dependence, cigarettes, uncomplicated: Secondary | ICD-10-CM | POA: Insufficient documentation

## 2018-05-21 DIAGNOSIS — Z79899 Other long term (current) drug therapy: Secondary | ICD-10-CM | POA: Insufficient documentation

## 2018-05-21 DIAGNOSIS — R45851 Suicidal ideations: Secondary | ICD-10-CM | POA: Diagnosis not present

## 2018-05-21 DIAGNOSIS — F250 Schizoaffective disorder, bipolar type: Secondary | ICD-10-CM

## 2018-05-21 DIAGNOSIS — F259 Schizoaffective disorder, unspecified: Secondary | ICD-10-CM | POA: Diagnosis present

## 2018-05-21 DIAGNOSIS — F209 Schizophrenia, unspecified: Secondary | ICD-10-CM | POA: Diagnosis not present

## 2018-05-21 DIAGNOSIS — F22 Delusional disorders: Secondary | ICD-10-CM | POA: Diagnosis not present

## 2018-05-21 DIAGNOSIS — F200 Paranoid schizophrenia: Secondary | ICD-10-CM | POA: Insufficient documentation

## 2018-05-21 DIAGNOSIS — R4189 Other symptoms and signs involving cognitive functions and awareness: Secondary | ICD-10-CM | POA: Diagnosis present

## 2018-05-21 DIAGNOSIS — F17200 Nicotine dependence, unspecified, uncomplicated: Secondary | ICD-10-CM | POA: Diagnosis present

## 2018-05-21 LAB — COMPREHENSIVE METABOLIC PANEL
ALK PHOS: 83 U/L (ref 38–126)
ALT: 22 U/L (ref 0–44)
ANION GAP: 9 (ref 5–15)
AST: 25 U/L (ref 15–41)
Albumin: 3.9 g/dL (ref 3.5–5.0)
BUN: 22 mg/dL (ref 8–23)
CALCIUM: 8.8 mg/dL — AB (ref 8.9–10.3)
CHLORIDE: 109 mmol/L (ref 98–111)
CO2: 25 mmol/L (ref 22–32)
CREATININE: 0.93 mg/dL (ref 0.61–1.24)
Glucose, Bld: 119 mg/dL — ABNORMAL HIGH (ref 70–99)
Potassium: 3.9 mmol/L (ref 3.5–5.1)
SODIUM: 143 mmol/L (ref 135–145)
Total Bilirubin: 0.7 mg/dL (ref 0.3–1.2)
Total Protein: 7.1 g/dL (ref 6.5–8.1)

## 2018-05-21 LAB — CBC
HCT: 40.2 % (ref 40.0–52.0)
HEMOGLOBIN: 13.6 g/dL (ref 13.0–18.0)
MCH: 32.9 pg (ref 26.0–34.0)
MCHC: 33.9 g/dL (ref 32.0–36.0)
MCV: 97.1 fL (ref 80.0–100.0)
PLATELETS: 168 10*3/uL (ref 150–440)
RBC: 4.14 MIL/uL — ABNORMAL LOW (ref 4.40–5.90)
RDW: 15.8 % — ABNORMAL HIGH (ref 11.5–14.5)
WBC: 6.4 10*3/uL (ref 3.8–10.6)

## 2018-05-21 LAB — SALICYLATE LEVEL

## 2018-05-21 LAB — ETHANOL

## 2018-05-21 LAB — ACETAMINOPHEN LEVEL

## 2018-05-21 MED ORDER — QUETIAPINE FUMARATE 400 MG PO TABS: 400.0000 mg | ORAL_TABLET | Freq: Every day | ORAL | 1 refills | Status: DC

## 2018-05-21 NOTE — ED Notes (Signed)
Patient discharged to [facility] with Chrystie Nose caregiver, patient received discharge papers and prescription for Seroquel. Patient received belongings and verbalized he has received all of his belongings. Patient appropriate and cooperative, Denies SI/HI AVH. Vital signs taken. NAD noted.

## 2018-05-21 NOTE — Discharge Instructions (Addendum)
You have been seen in the Emergency Department (ED)  today for a psychiatric complaint.  You have been evaluated by psychiatry and we believe you are safe to be discharged from the hospital.   ° °Please return to the Emergency Department (ED)  immediately if you have ANY thoughts of hurting yourself or anyone else, so that we may help you. ° °Please avoid alcohol and drug use. ° °Follow up with your doctor and/or therapist as soon as possible regarding today's ED  visit.  ° °You may call crisis hotline for [place] at [phone]. ° °

## 2018-05-21 NOTE — ED Notes (Signed)
Pt given sandwich tray 

## 2018-05-21 NOTE — ED Notes (Signed)
Pt belongings gathered in bag, shoes, clothes and hat

## 2018-05-21 NOTE — ED Provider Notes (Signed)
[facility] Emergency Department Provider Note  ____________________________________________  Time seen: Approximately 3:48 PM  I have reviewed the triage vital signs and the nursing notes.   HISTORY  Chief Complaint Hallucinations  Level 5 caveat:  Portions of the history and physical were unable to be obtained due to schizophrenia/ paranoia   HPI Joseph Holt. is a 67 y.o. male who presents from his group home for concerns of decreased sleep, suicidal thoughts, and hallucinations.  According to the group home, patient has been talking to himself.  He has not been sleeping well.  He has made some comments about suicidal ideation.  Patient is paranoid and thinks that "the factory and mills in the [place] are putting crack cocaine into people's drinks".  He denies hearing or seeing things but does laugh when I asked him about him speaking with 2 people that are not there.  He denies feeling suicidal at this time.  He denies drug or alcohol use.  He endorses compliance with his medications.  Past Medical History:  Diagnosis Date  . Bipolar 1 disorder ([place])   . Schizo affective schizophrenia [facility])     Patient Active Problem List   Diagnosis Date Noted  . Bipolar 1 disorder ([place]) 04/14/2018  . Schizo affective schizophrenia ([place]) 04/14/2018  . Cognitive impairment 01/06/2018  . Tobacco use disorder 01/02/2018  . Paranoid schizophrenia ([place]) 02/21/2015  . Social maladjustment 02/21/2015    Past Surgical History:  Procedure Laterality Date  . APPENDECTOMY    . HERNIA REPAIR      Prior to Admission medications   Medication Sig Start Date End Date Taking? Authorizing Provider  amantadine (SYMMETREL) 100 MG capsule Take 1 capsule (100 mg total) by mouth 2 (two) times daily. 01/20/18   Mar Daring, PA-C  cetirizine (ZYRTEC) 10 MG tablet Take 1 tablet (10 mg total) by mouth daily. 01/20/18   Mar Daring, PA-C  chlorproMAZINE  (THORAZINE) 25 MG tablet TAKE (1) TABLET BY MOUTH THREE TIMES A DAY. *NOTE DOSE* 04/26/18   Mar Daring, PA-C  cyanocobalamin 1000 MCG tablet Take 1 tablet (1,000 mcg total) by mouth daily. 01/20/18   Mar Daring, PA-C  divalproex (DEPAKOTE) 500 MG DR tablet Take 1 tablet (500 mg total) by mouth every 12 (twelve) hours. 01/20/18   Mar Daring, PA-C  Fluticasone-Salmeterol (ADVAIR) 250-50 MCG/DOSE AEPB Inhale 1 puff into the lungs 2 (two) times daily. 01/20/18   Mar Daring, PA-C  gabapentin (NEURONTIN) 400 MG capsule Take 1 capsule (400 mg total) by mouth every morning. 01/20/18   Mar Daring, PA-C  Icosapent Ethyl (VASCEPA) 1 g CAPS Take 2 capsules (2 g total) by mouth 2 (two) times daily. 05/05/18   Mar Daring, PA-C  LORazepam (ATIVAN) 0.5 MG tablet Take 1 tablet (0.5 mg total) by mouth at bedtime. 01/20/18   Mar Daring, PA-C  Memantine HCl-Donepezil HCl (NAMZARIC) 28-10 MG CP24 Take 1 capsule by mouth daily. 01/20/18   Mar Daring, PA-C  mirtazapine (REMERON) 30 MG tablet Take 1 tablet (30 mg total) by mouth at bedtime. 01/20/18   Mar Daring, PA-C  omeprazole (PRILOSEC) 20 MG capsule Take 1 capsule (20 mg total) by mouth every morning. 01/20/18   Mar Daring, PA-C  QUEtiapine (SEROQUEL) 400 MG tablet Take 1 tablet (400 mg total) by mouth at bedtime. 05/21/18   Clapacs, Madie Ren[place], MD  simvastatin (ZOCOR) 20 MG tablet Take 1 tablet (  20 mg total) by mouth at bedtime. 01/20/18   Mar Daring, PA-C  tamsulosin (FLOMAX) 0.4 MG CAPS capsule Take 1 capsule (0.4 mg total) by mouth at bedtime. 01/20/18   Mar Daring, PA-C  Vitamin D, Ergocalciferol, (DRISDOL) 50000 units CAPS capsule Take 1 capsule (50,000 Units total) by mouth every 7 (seven) days. 01/20/18   Mar Daring, PA-C    Allergies Patient has no known allergies.  No family history on file.  Social History Social History   Tobacco Use  .  Smoking status: Current Every Day Smoker    Packs/day: 0.20    Types: Cigarettes  . Smokeless tobacco: Never Used  . Tobacco comment: pt smokes 1 cigarette a day  Substance Use Topics  . Alcohol use: No  . Drug use: No    Review of Systems  Constitutional: Negative for fever. Eyes: Negative for visual changes. ENT: Negative for sore throat. Neck: No neck pain  Cardiovascular: Negative for chest pain. Respiratory: Negative for shortness of breath. Gastrointestinal: Negative for abdominal pain, vomiting or diarrhea. Genitourinary: Negative for dysuria. Musculoskeletal: Negative for back pain. Skin: Negative for rash. Neurological: Negative for headaches, weakness or numbness. Psych: No SI or HI. + paranoia  ____________________________________________   PHYSICAL EXAM:  VITAL SIGNS: ED Triage Vitals  Enc Vitals Group     BP 05/21/18 1450 118/70     Pulse Rate 05/21/18 1450 82     Resp 05/21/18 1450 16     Temp 05/21/18 1450 98.5 F (36.9 C)     Temp Source 05/21/18 1450 Oral     SpO2 05/21/18 1450 96 %     Weight 05/21/18 1451 160 lb (72.6 kg)     Height 05/21/18 1451 6' (1.829 m)     Head Circumference --      Peak Flow --      Pain Score 05/21/18 1450 0     Pain Loc --      Pain Edu? --      Excl. in [place]? --     Constitutional: Alert and oriented. Well appearing and in no apparent distress. HEENT:      Head: Normocephalic and atraumatic.         Eyes: Conjunctivae are normal. Sclera is non-icteric.       Mouth/Throat: Mucous membranes are moist.       Neck: Supple with no signs of meningismus. Cardiovascular: Regular rate and rhythm. No murmurs, gallops, or rubs. 2+ symmetrical distal pulses are present in all extremities. No JVD. Respiratory: Normal respiratory effort. Lungs are clear to auscultation bilaterally. No wheezes, crackles, or rhonchi.  Gastrointestinal: Soft, non tender, and non distended with positive bowel sounds. No rebound or  guarding. Musculoskeletal: Nontender with normal range of motion in all extremities. No edema, cyanosis, or erythema of extremities. Neurologic: Normal speech and language. Face is symmetric. Moving all extremities. No gross focal neurologic deficits are appreciated. Skin: Skin is warm, dry and intact. No rash noted. Psychiatric: Patient is paranoid, denies SI or HI, has been having hallucinations ____________________________________________   LABS (all labs ordered are listed, but only abnormal results are displayed)  Labs Reviewed  COMPREHENSIVE METABOLIC PANEL - Abnormal; Notable for the following components:      Result Value   Glucose, Bld 119 (*)    Calcium 8.8 (*)    All other components within normal limits  ACETAMINOPHEN LEVEL - Abnormal; Notable for the following components:   Acetaminophen (Tylenol), Serum <10 (*)  All other components within normal limits  CBC - Abnormal; Notable for the following components:   RBC 4.14 (*)    RDW 15.8 (*)    All other components within normal limits  ETHANOL  SALICYLATE LEVEL  URINE DRUG SCREEN, QUALITATIVE ([facility] ONLY)   ____________________________________________  EKG  none  ____________________________________________  RADIOLOGY  none  ____________________________________________   PROCEDURES  Procedure(s) performed: None Procedures Critical Care performed:  None ____________________________________________   INITIAL IMPRESSION / ASSESSMENT AND PLAN / ED COURSE   67 y.o. male who presents from his group home for concerns of decreased sleep, suicidal thoughts, and hallucinations.  Patient is here voluntarily and at this time he is of no threat to himself or others.  He does need to be evaluated by psychiatry for paranoia and schizophrenia. Will check labs for medical clearance and consult psych    _________________________ 6:09 PM on 05/21/2018 -----------------------------------------  Labs for medical  clearance with no acute findings.  Patient evaluated by Dr. Weber Cooks who cleared him for discharge.  He also increase patient's Seroquel from 300 to 400 mg daily.  Will call patient's group home for discharge.   As part of my medical decision making, I reviewed the following data within the [place] notes reviewed and incorporated, Labs reviewed , Old chart reviewed, A consult was requested and obtained from this/these consultant(s) Psychiatry, Notes from prior ED visits and [place] Controlled Substance Database    Pertinent labs & imaging results that were available during my care of the patient were reviewed by me and considered in my medical decision making (see chart for details).    ____________________________________________   FINAL CLINICAL IMPRESSION(S) / ED DIAGNOSES  Final diagnoses:  Schizophrenia, unspecified type ([place])      NEW MEDICATIONS STARTED DURING THIS VISIT:  ED Discharge Orders         Ordered    QUEtiapine (SEROQUEL) 400 MG tablet  Daily at bedtime     05/21/18 1807           Note:  This document was prepared using Dragon voice recognition software and may include unintentional dictation errors.    Rudene Re, MD 05/21/18 [phone]

## 2018-05-21 NOTE — ED Notes (Signed)
Spoke with Chrystie Nose, patient caregiver at [facility], to inform Ms. Bruce that patient has been discharged and she said they will be here in 20 min.

## 2018-05-21 NOTE — ED Notes (Signed)
Pt dressed out by this tech. Pts belongings include: navy hat, pair of socks, black belt, cigarettes, lighter, tan shorts, short sleeve collared polo, black short sleeve, plaid boxers and a pair of asics shoes. Pts belongings locked in secure room. Pt taken to room 20. Pt calm and cooperative during the process.

## 2018-05-21 NOTE — Consult Note (Signed)
[facility] Face-to-Face Psychiatry Consult   Reason for Consult: Consult for this 67 year old man with a history of schizophrenia who comes voluntarily to the emergency room Referring Physician: Alfred Levins Patient Identification: Joseph Holt. MRN:  [id number] Principal Diagnosis: Paranoid schizophrenia ([place]) Diagnosis:   Patient Active Problem List   Diagnosis Date Noted  . Bipolar 1 disorder ([place]) [F31.9] 04/14/2018  . Schizo affective schizophrenia ([place]) [F25.0] 04/14/2018  . Cognitive impairment [R41.89] 01/06/2018  . Tobacco use disorder [F17.200] 01/02/2018  . Paranoid schizophrenia ([place]) [F20.0] 02/21/2015  . Social maladjustment [Z60.9] 02/21/2015    Total Time spent with patient: 1 hour  Subjective:   Joseph Holt. is a 67 y.o. male patient admitted with "I have been talking to myself".  HPI: Patient seen chart reviewed.  Patient says he came to the emergency room because he has been talking to himself and has been sleeping poorly.  He says he has not slept well for at least 6 days.  He does not know of any reason for this.  He says he takes his medication regularly.  Patient does not describe hearing auditory hallucinations.  He says his mood is okay.  Not particularly sad and not particularly irritable.  Appetite feels stable.  No new physical complaints.  Denies any substance abuse.  He pointedly denies any suicidal or homicidal ideation.  Patient is pleasant and calm in the interview.  He tells me that he thought maybe he should come to the hospital for a few days because he might just "calm down".  No apparent violence or behavior problems.  Social history: Lives in a group home has been there long-term.  Seems to get along reasonably well.  Medical history: Pretty good health overall for his age.  Diagnosis of dementia which appears to be stable.  Substance abuse history: Smokes but no alcohol or drug abuse  Past Psychiatric History: Patient has a diagnosis of  schizophrenia and has had hospitalizations in the past most recently in April of this year.  Usually is calm and appropriate in the hospital and adjust well.  He is on Depakote Seroquel and Thorazine and Ativan and seems to be on the same medicine he was on when he was discharged last time.  No history of violence or suicidal ideation.  Risk to Self:   Risk to Others:   Prior Inpatient Therapy:   Prior Outpatient Therapy:    Past Medical History:  Past Medical History:  Diagnosis Date  . Bipolar 1 disorder ([place])   . Schizo affective schizophrenia [facility])     Past Surgical History:  Procedure Laterality Date  . APPENDECTOMY    . HERNIA REPAIR     Family History: No family history on file. Family Psychiatric  History: None known Social History:  Social History   Substance and Sexual Activity  Alcohol Use No     Social History   Substance and Sexual Activity  Drug Use No    Social History   Socioeconomic History  . Marital status: Single    Spouse name: Not on file  . Number of children: Not on file  . Years of education: Not on file  . Highest education level: Not on file  Occupational History  . Not on file  Social Needs  . Financial resource strain: Not on file  . Food insecurity:    Worry: Not on file    Inability: Not on file  . Transportation needs:    Medical: Not on  file    Non-medical: Not on file  Tobacco Use  . Smoking status: Current Every Day Smoker    Packs/day: 0.20    Types: Cigarettes  . Smokeless tobacco: Never Used  . Tobacco comment: pt smokes 1 cigarette a day  Substance and Sexual Activity  . Alcohol use: No  . Drug use: No  . Sexual activity: Not on file  Lifestyle  . Physical activity:    Days per week: Not on file    Minutes per session: Not on file  . Stress: Not on file  Relationships  . Social connections:    Talks on phone: Not on file    Gets together: Not on file    Attends religious service: Not on file    Active member  of club or organization: Not on file    Attends meetings of clubs or organizations: Not on file    Relationship status: Not on file  Other Topics Concern  . Not on file  Social History Narrative  . Not on file   Additional Social History:    Allergies:  No Known Allergies  Labs:  Results for orders placed or performed during the hospital encounter of 05/21/18 (from the past 48 hour(s))  Comprehensive metabolic panel     Status: Abnormal   Collection Time: 05/21/18  2:56 PM  Result Value Ref Range   Sodium 143 135 - 145 mmol/L   Potassium 3.9 3.5 - 5.1 mmol/L   Chloride 109 98 - 111 mmol/L   CO2 25 22 - 32 mmol/L   Glucose, Bld 119 (H) 70 - 99 mg/dL   BUN 22 8 - 23 mg/dL   Creatinine, Ser 0.93 0.61 - 1.24 mg/dL   Calcium 8.8 (L) 8.9 - 10.3 mg/dL   Total Protein 7.1 6.5 - 8.1 g/dL   Albumin 3.9 3.5 - 5.0 g/dL   AST 25 15 - 41 U/L   ALT 22 0 - 44 U/L   Alkaline Phosphatase 83 38 - 126 U/L   Total Bilirubin 0.7 0.3 - 1.2 mg/dL   GFR calc non Af Amer >60 >60 mL/min   GFR calc Af Amer >60 >60 mL/min    Comment: (NOTE) The eGFR has been calculated using the CKD EPI equation. This calculation has not been validated in all clinical situations. eGFR's persistently <60 mL/min signify possible Chronic Kidney Disease.    Anion gap 9 5 - 15    Comment: Performed at [facility], [address] [place], [place] [address]  Ethanol     Status: None   Collection Time: 05/21/18  2:56 PM  Result Value Ref Range   Alcohol, Ethyl (B) <10 <10 mg/dL    Comment: (NOTE) Lowest detectable limit for serum alcohol is 10 mg/dL. For medical purposes only. Performed at [facility], [address] [place], [place] [address]   Salicylate level     Status: None   Collection Time: 05/21/18  2:56 PM  Result Value Ref Range   Salicylate Lvl <5.0 2.8 - 30.0 mg/dL    Comment: Performed at [facility], [address] [place], [place] [address]  Acetaminophen level      Status: Abnormal   Collection Time: 05/21/18  2:56 PM  Result Value Ref Range   Acetaminophen (Tylenol), Serum <10 (L) 10 - 30 ug/mL    Comment: (NOTE) Therapeutic concentrations vary significantly. A range of 10-30 ug/mL  may be an effective concentration for many patients. However, some  are best treated at concentrations outside of this range. Acetaminophen concentrations >150 ug/mL at 4 hours after ingestion  and >50 ug/mL at 12 hours after ingestion are often associated with  toxic reactions. Performed at [facility], [address] [place], [place] [address]   cbc     Status: Abnormal   Collection Time: 05/21/18  2:56 PM  Result Value Ref Range   WBC 6.4 3.8 - 10.6 K/uL   RBC 4.14 (L) 4.40 - 5.90 MIL/uL   Hemoglobin 13.6 13.0 - 18.0 g/dL   HCT 40.2 40.0 - 52.0 %   MCV 97.1 80.0 - 100.0 fL   MCH 32.9 26.0 - 34.0 pg   MCHC 33.9 32.0 - 36.0 g/dL   RDW 15.8 (H) 11.5 - 14.5 %   Platelets 168 150 - 440 K/uL    Comment: Performed at [facility], [address] [place], [place] [address]    No current facility-administered medications for this encounter.    Current Outpatient Medications  Medication Sig Dispense Refill  . amantadine (SYMMETREL) 100 MG capsule Take 1 capsule (100 mg total) by mouth 2 (two) times daily. 180 capsule 1  . cetirizine (ZYRTEC) 10 MG tablet Take 1 tablet (10 mg total) by mouth daily. 90 tablet 1  . chlorproMAZINE (THORAZINE) 25 MG tablet TAKE (1) TABLET BY MOUTH THREE TIMES A DAY. *NOTE DOSE* 90 tablet 5  . cyanocobalamin 1000 MCG tablet Take 1 tablet (1,000 mcg total) by mouth daily. 90 tablet 1  . divalproex (DEPAKOTE) 500 MG DR tablet Take 1 tablet (500 mg total) by mouth every 12 (twelve) hours. 90 tablet 1  . Fluticasone-Salmeterol (ADVAIR) 250-50 MCG/DOSE AEPB Inhale 1 puff into the lungs 2 (two) times daily. 180 each 1  . gabapentin (NEURONTIN) 400 MG capsule Take 1 capsule (400 mg total) by mouth every morning. 90  capsule 1  . Icosapent Ethyl (VASCEPA) 1 g CAPS Take 2 capsules (2 g total) by mouth 2 (two) times daily. 180 capsule 1  . LORazepam (ATIVAN) 0.5 MG tablet Take 1 tablet (0.5 mg total) by mouth at bedtime. 90 tablet 1  . Memantine HCl-Donepezil HCl (NAMZARIC) 28-10 MG CP24 Take 1 capsule by mouth daily. 90 capsule 1  . mirtazapine (REMERON) 30 MG tablet Take 1 tablet (30 mg total) by mouth at bedtime. 90 tablet 1  . omeprazole (PRILOSEC) 20 MG capsule Take 1 capsule (20 mg total) by mouth every morning. 90 capsule 1  . QUEtiapine (SEROQUEL) 400 MG tablet Take 1 tablet (400 mg total) by mouth at bedtime. 30 tablet 1  . simvastatin (ZOCOR) 20 MG tablet Take 1 tablet (20 mg total) by mouth at bedtime. 90 tablet 1  . tamsulosin (FLOMAX) 0.4 MG CAPS capsule Take 1 capsule (0.4 mg total) by mouth at bedtime. 90 capsule 1  . Vitamin D, Ergocalciferol, (DRISDOL) 50000 units CAPS capsule Take 1 capsule (50,000 Units total) by mouth every 7 (seven) days. 12 capsule 1    Musculoskeletal: Strength & Muscle Tone: within normal limits Gait & Station: normal Patient leans: N/A  Psychiatric Specialty Exam: Physical Exam  Nursing note and vitals reviewed. Constitutional: He appears well-developed and well-nourished.  HENT:  Head: Normocephalic and atraumatic.  Eyes: Pupils are equal, round, and reactive to light. Conjunctivae are normal.  Neck: Normal range of motion.  Cardiovascular: Regular rhythm and normal heart sounds.  Respiratory: Effort normal. No respiratory distress.  GI: Soft.  Musculoskeletal: Normal range of motion.  Neurological: He is  alert.  Skin: Skin is warm and dry.  Psychiatric: His speech is normal and behavior is normal. Judgment and thought content normal. His mood appears anxious. He exhibits abnormal recent memory.    Review of Systems  Constitutional: Negative.   HENT: Negative.   Eyes: Negative.   Respiratory: Negative.   Cardiovascular: Negative.   Gastrointestinal:  Negative.   Musculoskeletal: Negative.   Skin: Negative.   Neurological: Negative.   Psychiatric/Behavioral: Negative for depression, hallucinations, memory loss, substance abuse and suicidal ideas. The patient is nervous/anxious and has insomnia.     Blood pressure 118/70, pulse 82, temperature 98.5 F (36.9 C), temperature source Oral, resp. rate 16, height 6' (1.829 m), weight 72.6 kg, SpO2 96 %.Body mass index is 21.7 kg/m.  General Appearance: Casual  Eye Contact:  Good  Speech:  Slow  Volume:  Decreased  Mood:  Euthymic  Affect:  Constricted  Thought Process:  Goal Directed  Orientation:  Full (Time, Place, and Person)  Thought Content:  Logical  Suicidal Thoughts:  No  Homicidal Thoughts:  No  Memory:  Immediate;   Fair Recent;   Fair Remote;   Fair  Judgement:  Fair  Insight:  Fair  Psychomotor Activity:  Decreased  Concentration:  Concentration: Fair  Recall:  [employer] of Knowledge:  Fair  Language:  Fair  Akathisia:  No  Handed:  Right  AIMS (if indicated):     Assets:  Desire for Improvement Housing Physical Health Resilience  ADL's:  Intact  Cognition:  WNL  Sleep:        Treatment Plan Summary: Medication management and Plan Patient with dementia although he really is not all that cognitively impaired.  Schizophrenia.  Comes into the emergency room with rather vague complaints.  The most specific thing we could put our finger on his insomnia.  Possibly a vague sense of unease but denies suicidal or homicidal ideation.  Not having significant behavior problems.  There is no indication for hospitalization.  Did some supportive counseling and psychoeducation with the patient.  I am going to increase his Seroquel dose to 400 mg at night and have printed out a prescription for that and changed the dose in the computer.  Hopefully this will be enough to keep him sleeping better.  Otherwise this sounds at this point like something that can be dealt with as an  outpatient matter.  Case reviewed with ER physician.  Disposition: No evidence of imminent risk to self or others at present.   Patient does not meet criteria for psychiatric inpatient admission. Supportive therapy provided about ongoing stressors. Discussed crisis plan, support from social network, calling 911, coming to the Emergency Department, and calling Suicide Hotline.  Alethia Berthold, MD 05/21/2018 6:10 PM

## 2018-05-21 NOTE — ED Notes (Signed)
Spoke with Chrystie Nose and informed her that patient was just walked to the waiting room, and she said she was in the parking lot

## 2018-05-21 NOTE — ED Notes (Signed)
Joseph Holt, caregiver [phone]

## 2018-05-21 NOTE — ED Triage Notes (Addendum)
PT to ED from  [facility] with caregivers. Per caregivers pt has been talking to himself and decreased sleep xcouple weeks. Pt denies SI or HI. Per caregivers pt has had verbal SI thoughts throughout this episode. Pt calm and cooperative but appears paranoid. Hx of psych  VSS

## 2018-05-21 NOTE — ED Notes (Signed)
Patient assigned to appropriate care area   Introduced self to pt  Patient oriented to unit/care area: Informed that, for their safety, care areas are designed for safety and visiting and phone hours explained to patient. Patient verbalizes understanding, and verbal contract for safety obtained  Environment secured   Patient arrived from [facility] with caregivers. Patient says he has not been sleeping for a few days. Pt denies SI or HI. Pt calm and cooperative but appears paranoid. Hx of psych

## 2018-07-14 ENCOUNTER — Ambulatory Visit: Payer: Self-pay | Admitting: Physician Assistant

## 2018-07-23 ENCOUNTER — Telehealth: Payer: Self-pay | Admitting: Physician Assistant

## 2018-07-23 DIAGNOSIS — F200 Paranoid schizophrenia: Secondary | ICD-10-CM

## 2018-07-23 NOTE — Telephone Encounter (Signed)
[facility] faxed refill request for the following medications:  LORazepam (ATIVAN) 0.5 MG tablet  Qty: 30  Last dispensed: 06/21/2018  Please advise.

## 2018-07-26 ENCOUNTER — Ambulatory Visit (INDEPENDENT_AMBULATORY_CARE_PROVIDER_SITE_OTHER): Payer: Medicare Other | Admitting: Physician Assistant

## 2018-07-26 ENCOUNTER — Encounter: Payer: Self-pay | Admitting: Physician Assistant

## 2018-07-26 VITALS — BP 102/60 | HR 86 | Temp 97.4°F | Resp 16 | Wt 149.2 lb

## 2018-07-26 DIAGNOSIS — L509 Urticaria, unspecified: Secondary | ICD-10-CM | POA: Diagnosis not present

## 2018-07-26 DIAGNOSIS — R251 Tremor, unspecified: Secondary | ICD-10-CM

## 2018-07-26 DIAGNOSIS — E538 Deficiency of other specified B group vitamins: Secondary | ICD-10-CM | POA: Diagnosis not present

## 2018-07-26 DIAGNOSIS — F200 Paranoid schizophrenia: Secondary | ICD-10-CM

## 2018-07-26 DIAGNOSIS — F0281 Dementia in other diseases classified elsewhere with behavioral disturbance: Secondary | ICD-10-CM

## 2018-07-26 DIAGNOSIS — R4189 Other symptoms and signs involving cognitive functions and awareness: Secondary | ICD-10-CM | POA: Diagnosis not present

## 2018-07-26 DIAGNOSIS — G629 Polyneuropathy, unspecified: Secondary | ICD-10-CM

## 2018-07-26 DIAGNOSIS — F259 Schizoaffective disorder, unspecified: Secondary | ICD-10-CM

## 2018-07-26 DIAGNOSIS — E7800 Pure hypercholesterolemia, unspecified: Secondary | ICD-10-CM | POA: Diagnosis not present

## 2018-07-26 DIAGNOSIS — E781 Pure hyperglyceridemia: Secondary | ICD-10-CM | POA: Diagnosis not present

## 2018-07-26 DIAGNOSIS — N401 Enlarged prostate with lower urinary tract symptoms: Secondary | ICD-10-CM

## 2018-07-26 DIAGNOSIS — F319 Bipolar disorder, unspecified: Secondary | ICD-10-CM | POA: Diagnosis not present

## 2018-07-26 DIAGNOSIS — R3914 Feeling of incomplete bladder emptying: Secondary | ICD-10-CM

## 2018-07-26 DIAGNOSIS — K219 Gastro-esophageal reflux disease without esophagitis: Secondary | ICD-10-CM | POA: Diagnosis not present

## 2018-07-26 DIAGNOSIS — J418 Mixed simple and mucopurulent chronic bronchitis: Secondary | ICD-10-CM

## 2018-07-26 DIAGNOSIS — F02818 Dementia in other diseases classified elsewhere, unspecified severity, with other behavioral disturbance: Secondary | ICD-10-CM

## 2018-07-26 DIAGNOSIS — F250 Schizoaffective disorder, bipolar type: Secondary | ICD-10-CM

## 2018-07-26 MED ORDER — GABAPENTIN 400 MG PO CAPS: 400.0000 mg | ORAL_CAPSULE | Freq: Every morning | ORAL | 1 refills | Status: DC

## 2018-07-26 MED ORDER — CYANOCOBALAMIN 1000 MCG PO TABS: 1000.0000 ug | ORAL_TABLET | Freq: Every day | ORAL | 1 refills | Status: DC

## 2018-07-26 MED ORDER — OMEPRAZOLE 20 MG PO CPDR: 20.0000 mg | DELAYED_RELEASE_CAPSULE | Freq: Every morning | ORAL | 1 refills | Status: DC

## 2018-07-26 MED ORDER — QUETIAPINE FUMARATE ER 300 MG PO TB24: 600.0000 mg | ORAL_TABLET | Freq: Every day | ORAL | 1 refills | Status: DC

## 2018-07-26 MED ORDER — SIMVASTATIN 20 MG PO TABS: 20.0000 mg | ORAL_TABLET | Freq: Every day | ORAL | 1 refills | Status: DC

## 2018-07-26 MED ORDER — TAMSULOSIN HCL 0.4 MG PO CAPS: 0.4000 mg | ORAL_CAPSULE | Freq: Every day | ORAL | 1 refills | Status: DC

## 2018-07-26 MED ORDER — LORAZEPAM 0.5 MG PO TABS: 0.5000 mg | ORAL_TABLET | Freq: Every day | ORAL | 1 refills | Status: DC

## 2018-07-26 MED ORDER — MIRTAZAPINE 30 MG PO TABS: 30.0000 mg | ORAL_TABLET | Freq: Every day | ORAL | 1 refills | Status: DC

## 2018-07-26 MED ORDER — MEMANTINE HCL-DONEPEZIL HCL 28-10 MG PO CP24
1.0000 | ORAL_CAPSULE | Freq: Every day | ORAL | 1 refills | Status: DC
Start: 2018-07-26 — End: 2019-02-07

## 2018-07-26 MED ORDER — FLUTICASONE-SALMETEROL 250-50 MCG/DOSE IN AEPB: 1.0000 | INHALATION_SPRAY | Freq: Two times a day (BID) | RESPIRATORY_TRACT | 1 refills | Status: DC

## 2018-07-26 MED ORDER — CETIRIZINE HCL 10 MG PO TABS: 10.0000 mg | ORAL_TABLET | Freq: Every day | ORAL | 1 refills | Status: DC

## 2018-07-26 MED ORDER — DIVALPROEX SODIUM 500 MG PO DR TAB: 500.0000 mg | DELAYED_RELEASE_TABLET | Freq: Two times a day (BID) | ORAL | 1 refills | Status: DC

## 2018-07-26 MED ORDER — AMANTADINE HCL 100 MG PO CAPS: 100.0000 mg | ORAL_CAPSULE | Freq: Two times a day (BID) | ORAL | 1 refills | Status: DC

## 2018-07-26 NOTE — Telephone Encounter (Signed)
refilled 

## 2018-07-26 NOTE — Progress Notes (Signed)
Patient: Joseph Holt. Male    DOB: [birth year]   67 y.o.   MRN: [id number] Visit Date: 07/26/2018  Today's Provider: Mar Daring, PA-C   Chief Complaint  Patient presents with  . Follow-up   Subjective:    HPI Patient here to follow up on Paranoid Schizophrenia, Bipolar 1 disorder. According to [facility] from home group patient is talking too much at night and still not sleeping well. Per patient he takes his medications.    No Known Allergies   Current Outpatient Medications:  .  amantadine (SYMMETREL) 100 MG capsule, Take 1 capsule (100 mg total) by mouth 2 (two) times daily., Disp: 180 capsule, Rfl: 1 .  cetirizine (ZYRTEC) 10 MG tablet, Take 1 tablet (10 mg total) by mouth daily., Disp: 90 tablet, Rfl: 1 .  chlorproMAZINE (THORAZINE) 25 MG tablet, TAKE (1) TABLET BY MOUTH THREE TIMES A DAY. *NOTE DOSE*, Disp: 90 tablet, Rfl: 5 .  cyanocobalamin 1000 MCG tablet, Take 1 tablet (1,000 mcg total) by mouth daily., Disp: 90 tablet, Rfl: 1 .  divalproex (DEPAKOTE) 500 MG DR tablet, Take 1 tablet (500 mg total) by mouth every 12 (twelve) hours., Disp: 90 tablet, Rfl: 1 .  Fluticasone-Salmeterol (ADVAIR) 250-50 MCG/DOSE AEPB, Inhale 1 puff into the lungs 2 (two) times daily., Disp: 180 each, Rfl: 1 .  Icosapent Ethyl (VASCEPA) 1 g CAPS, Take 2 capsules (2 g total) by mouth 2 (two) times daily., Disp: 180 capsule, Rfl: 1 .  LORazepam (ATIVAN) 0.5 MG tablet, Take 1 tablet (0.5 mg total) by mouth at bedtime., Disp: 90 tablet, Rfl: 1 .  Memantine HCl-Donepezil HCl (NAMZARIC) 28-10 MG CP24, Take 1 capsule by mouth daily., Disp: 90 capsule, Rfl: 1 .  mirtazapine (REMERON) 30 MG tablet, Take 1 tablet (30 mg total) by mouth at bedtime., Disp: 90 tablet, Rfl: 1 .  omeprazole (PRILOSEC) 20 MG capsule, Take 1 capsule (20 mg total) by mouth every morning., Disp: 90 capsule, Rfl: 1 .  QUEtiapine (SEROQUEL) 400 MG tablet, Take 1 tablet (400 mg total) by mouth at bedtime., Disp: 30  tablet, Rfl: 1 .  simvastatin (ZOCOR) 20 MG tablet, Take 1 tablet (20 mg total) by mouth at bedtime., Disp: 90 tablet, Rfl: 1 .  tamsulosin (FLOMAX) 0.4 MG CAPS capsule, Take 1 capsule (0.4 mg total) by mouth at bedtime., Disp: 90 capsule, Rfl: 1 .  Vitamin D, Ergocalciferol, (DRISDOL) 50000 units CAPS capsule, Take 1 capsule (50,000 Units total) by mouth every 7 (seven) days., Disp: 12 capsule, Rfl: 1 .  gabapentin (NEURONTIN) 400 MG capsule, Take 1 capsule (400 mg total) by mouth every morning., Disp: 90 capsule, Rfl: 1  Review of Systems  Constitutional: Negative.   Respiratory: Negative.   Cardiovascular: Negative.   Gastrointestinal: Negative.   Psychiatric/Behavioral: Positive for agitation, hallucinations and sleep disturbance.    Social History   Tobacco Use  . Smoking status: Current Every Day Smoker    Packs/day: 0.20    Types: Cigarettes  . Smokeless tobacco: Never Used  . Tobacco comment: pt smokes 1 cigarette a day  Substance Use Topics  . Alcohol use: No   Objective:   BP 102/60 (BP Location: Left Arm, Patient Position: Sitting, Cuff Size: Normal)   Pulse 86   Temp (!) 97.4 F (36.3 C) (Oral)   Resp 16   Wt 149 lb 3.2 oz (67.7 kg)   SpO2 97%   BMI 20.24 kg/m  Vitals:   07/26/18  1057  BP: 102/60  Pulse: 86  Resp: 16  Temp: (!) 97.4 F (36.3 C)  TempSrc: Oral  SpO2: 97%  Weight: 149 lb 3.2 oz (67.7 kg)     Physical Exam  Constitutional: He appears well-developed and well-nourished. No distress.  HENT:  Head: Normocephalic and atraumatic.  Neck: Normal range of motion. Neck supple.  Cardiovascular: Normal rate, regular rhythm and normal heart sounds. Exam reveals no gallop and no friction rub.  No murmur heard. Pulmonary/Chest: Effort normal and breath sounds normal. No respiratory distress. He has no wheezes. He has no rales.  Skin: He is not diaphoretic.  Psychiatric: His affect is blunt. His speech is rapid and/or pressured. He is agitated and  actively hallucinating. Thought content is paranoid. He expresses no homicidal and no suicidal ideation.  Vitals reviewed.      Assessment & Plan:     1. Paranoid schizophrenia ([place]) Increase Seroquel to 600mg  as below for sleep and hallucinations. Continue depakote and mirtazapine as prescribed. I will see him back in 4-6 weeks to see if he is sleeping better.  - QUEtiapine (SEROQUEL XR) 300 MG 24 hr tablet; Take 2 tablets (600 mg total) by mouth at bedtime.  Dispense: 180 tablet; Refill: 1 - divalproex (DEPAKOTE) 500 MG DR tablet; Take 1 tablet (500 mg total) by mouth every 12 (twelve) hours.  Dispense: 90 tablet; Refill: 1 - mirtazapine (REMERON) 30 MG tablet; Take 1 tablet (30 mg total) by mouth at bedtime.  Dispense: 90 tablet; Refill: 1  2. Schizo affective schizophrenia ([place]) See above medical treatment plan. - QUEtiapine (SEROQUEL XR) 300 MG 24 hr tablet; Take 2 tablets (600 mg total) by mouth at bedtime.  Dispense: 180 tablet; Refill: 1  3. Bipolar 1 disorder ([place]) See above medical treatment plan. - QUEtiapine (SEROQUEL XR) 300 MG 24 hr tablet; Take 2 tablets (600 mg total) by mouth at bedtime.  Dispense: 180 tablet; Refill: 1  4. Dementia associated with other underlying disease with behavioral disturbance ([place]) Stable. Diagnosis pulled for medication refill. Continue current medical treatment plan. - Memantine HCl-Donepezil HCl (NAMZARIC) 28-10 MG CP24; Take 1 capsule by mouth daily.  Dispense: 90 capsule; Refill: 1  5. B12 deficiency Stable. Diagnosis pulled for medication refill. Continue current medical treatment plan. - cyanocobalamin 1000 MCG tablet; Take 1 tablet (1,000 mcg total) by mouth daily.  Dispense: 90 tablet; Refill: 1  6. Tremor Stable. Diagnosis pulled for medication refill. Continue current medical treatment plan. - amantadine (SYMMETREL) 100 MG capsule; Take 1 capsule (100 mg total) by mouth 2 (two) times daily.  Dispense: 180 capsule; Refill: 1  7.  Cognitive impairment Stable. Diagnosis pulled for medication refill. Continue current medical treatment plan. - divalproex (DEPAKOTE) 500 MG DR tablet; Take 1 tablet (500 mg total) by mouth every 12 (twelve) hours.  Dispense: 90 tablet; Refill: 1  8. Neuropathy Stable. Diagnosis pulled for medication refill. Continue current medical treatment plan. - gabapentin (NEURONTIN) 400 MG capsule; Take 1 capsule (400 mg total) by mouth every morning.  Dispense: 90 capsule; Refill: 1  9. Hypercholesteremia Stable. Diagnosis pulled for medication refill. Continue current medical treatment plan. - simvastatin (ZOCOR) 20 MG tablet; Take 1 tablet (20 mg total) by mouth at bedtime.  Dispense: 90 tablet; Refill: 1  10. Hypertriglyceridemia Stable. Diagnosis pulled for medication refill. Continue current medical treatment plan. - simvastatin (ZOCOR) 20 MG tablet; Take 1 tablet (20 mg total) by mouth at bedtime.  Dispense: 90 tablet; Refill: 1  11.  Gastroesophageal reflux disease without esophagitis Stable. Diagnosis pulled for medication refill. Continue current medical treatment plan. - omeprazole (PRILOSEC) 20 MG capsule; Take 1 capsule (20 mg total) by mouth every morning.  Dispense: 90 capsule; Refill: 1  12. Urticaria Stable. Diagnosis pulled for medication refill. Continue current medical treatment plan. - cetirizine (ZYRTEC) 10 MG tablet; Take 1 tablet (10 mg total) by mouth daily.  Dispense: 90 tablet; Refill: 1  13. Mixed simple and mucopurulent chronic bronchitis (H[facility]) Stable. Diagnosis pulled for medication refill. Continue current medical treatment plan. - Fluticasone-Salmeterol (ADVAIR) 250-50 MCG/DOSE AEPB; Inhale 1 puff into the lungs 2 (two) times daily.  Dispense: 180 each; Refill: 1  14. Benign prostatic hyperplasia with incomplete bladder emptying Stable. Diagnosis pulled for medication refill. Continue current medical treatment plan. - tamsulosin (FLOMAX) 0.4 MG CAPS capsule; Take 1  capsule (0.4 mg total) by mouth at bedtime.  Dispense: 90 capsule; Refill: [place], PA-C  [facility]

## 2018-08-04 DIAGNOSIS — F331 Major depressive disorder, recurrent, moderate: Secondary | ICD-10-CM | POA: Diagnosis not present

## 2018-08-26 ENCOUNTER — Encounter: Payer: Self-pay | Admitting: Podiatry

## 2018-08-26 ENCOUNTER — Ambulatory Visit (INDEPENDENT_AMBULATORY_CARE_PROVIDER_SITE_OTHER): Payer: Medicare Other | Admitting: Podiatry

## 2018-08-26 DIAGNOSIS — M79675 Pain in left toe(s): Secondary | ICD-10-CM | POA: Diagnosis not present

## 2018-08-26 DIAGNOSIS — M79674 Pain in right toe(s): Secondary | ICD-10-CM | POA: Diagnosis not present

## 2018-08-26 DIAGNOSIS — B351 Tinea unguium: Secondary | ICD-10-CM

## 2018-08-26 NOTE — Progress Notes (Signed)
This patient presents to the office with chief complaint of long thick nails and diabetic feet.  This patient  says there  is  no pain and discomfort in his  feet.  This patient says there are long thick painful nails.  These nails are painful walking and wearing shoes.  Patient has no history of infection or drainage from both feet.  Patient is unable to  self treat his own nails . This patient presents  to the office today for treatment of the  long nails and a foot evaluation due to history of  diabetes.  General Appearance  Alert, conversant and in no acute stress.  Vascular  Dorsalis pedis and posterior tibial  pulses are palpable  bilaterally.  Capillary return is within normal limits  bilaterally. Temperature is within normal limits  bilaterally.  Neurologic  Senn-Weinstein monofilament wire test within normal limits  bilaterally. Muscle power within normal limits bilaterally.  Nails Thick disfigured discolored nails with subungual debris   hallux bilaterally. No evidence of bacterial infection or drainage bilaterally.  Orthopedic  No limitations of motion of motion feet .  No crepitus or effusions noted.  No bony pathology or digital deformities noted.  Skin  normotropic skin with no porokeratosis noted bilaterally.  No signs of infections or ulcers noted.     Onychomycosis  Diabetes with no foot complications  IE  Debride nails x 2.  A diabetic foot exam was performed and there is no evidence of any vascular or neurologic pathology.   RTC 3 months.   Gardiner Barefoot DPM

## 2018-08-27 ENCOUNTER — Other Ambulatory Visit: Payer: Self-pay

## 2018-08-27 ENCOUNTER — Emergency Department
Admission: EM | Admit: 2018-08-27 | Discharge: 2018-08-28 | Disposition: A | Discharge status: Home / Self Care | Payer: Medicare Other | Attending: Emergency Medicine | Admitting: Emergency Medicine

## 2018-08-27 DIAGNOSIS — Z0000 Encounter for general adult medical examination without abnormal findings: Secondary | ICD-10-CM | POA: Diagnosis not present

## 2018-08-27 DIAGNOSIS — Z139 Encounter for screening, unspecified: Secondary | ICD-10-CM

## 2018-08-27 DIAGNOSIS — Z022 Encounter for examination for admission to residential institution: Secondary | ICD-10-CM | POA: Insufficient documentation

## 2018-08-27 DIAGNOSIS — Z79899 Other long term (current) drug therapy: Secondary | ICD-10-CM | POA: Insufficient documentation

## 2018-08-27 DIAGNOSIS — F17210 Nicotine dependence, cigarettes, uncomplicated: Secondary | ICD-10-CM | POA: Insufficient documentation

## 2018-08-27 NOTE — Discharge Instructions (Addendum)
Please return to your group home.  Please discuss with your group home manager if you still wish to transfer to a different group home.  Return to the emergency department for any symptoms concerning to yourself for a medical or psychiatric emergency.

## 2018-08-27 NOTE — ED Triage Notes (Signed)
Patient to ED via [facility] requesting medical evaluation to move to a new group home. 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 Patient is answering questions correctly at this time and is pleasant and cooperative. States he just needs to get out of the weather because it's cold.

## 2018-08-27 NOTE — ED Provider Notes (Signed)
[facility] Emergency Department Provider Note  Time seen: 10:05 PM  I have reviewed the triage vital signs and the nursing notes.   HISTORY  Chief Complaint Medical Clearance    HPI Joseph Corp. is a 67 y.o. male with a past medical history of bipolar, schizophrenia presents to the emergency department because he wants a new group home.  According to the patient he states the group home he is currently and is not a good match for him.  States "there is only 2 white people in the group home, everyone else's black."  Patient states the other white person he does not get along with either because he is from [place] the patient is from [place].  Patient states he decided to come to the emergency department to be placed in a new group home.  Patient states he has no medical concerns, states he was cold outside and he did not want to sleep outside so he came to the ER.   Past Medical History:  Diagnosis Date  . Bipolar 1 disorder ([place])   . Schizo affective schizophrenia [facility])     Patient Active Problem List   Diagnosis Date Noted  . Bipolar 1 disorder ([place]) 04/14/2018  . Schizo affective schizophrenia ([place]) 04/14/2018  . Cognitive impairment 01/06/2018  . Tobacco use disorder 01/02/2018  . Paranoid schizophrenia ([place]) 02/21/2015  . Social maladjustment 02/21/2015    Past Surgical History:  Procedure Laterality Date  . APPENDECTOMY    . HERNIA REPAIR      Prior to Admission medications   Medication Sig Start Date End Date Taking? Authorizing Provider  amantadine (SYMMETREL) 100 MG capsule Take 1 capsule (100 mg total) by mouth 2 (two) times daily. 07/26/18   Mar Daring, PA-C  cetirizine (ZYRTEC) 10 MG tablet Take 1 tablet (10 mg total) by mouth daily. 07/26/18   Mar Daring, PA-C  chlorproMAZINE (THORAZINE) 25 MG tablet TAKE (1) TABLET BY MOUTH THREE TIMES A DAY. *NOTE DOSE* 04/26/18   Mar Daring, PA-C   cyanocobalamin 1000 MCG tablet Take 1 tablet (1,000 mcg total) by mouth daily. 07/26/18   Mar Daring, PA-C  divalproex (DEPAKOTE) 500 MG DR tablet Take 1 tablet (500 mg total) by mouth every 12 (twelve) hours. 07/26/18   Mar Daring, PA-C  donepezil (ARICEPT) 5 MG tablet Take by mouth.    [provider]  Fluticasone-Salmeterol (ADVAIR) 250-50 MCG/DOSE AEPB Inhale 1 puff into the lungs 2 (two) times daily. 07/26/18   Mar Daring, PA-C  gabapentin (NEURONTIN) 400 MG capsule Take 1 capsule (400 mg total) by mouth every morning. 07/26/18   Mar Daring, PA-C  Icosapent Ethyl (VASCEPA) 1 g CAPS Take 2 capsules (2 g total) by mouth 2 (two) times daily. 05/05/18   Mar Daring, PA-C  LORazepam (ATIVAN) 0.5 MG tablet Take 1 tablet (0.5 mg total) by mouth at bedtime. 07/26/18   Mar Daring, PA-C  Memantine HCl-Donepezil HCl (NAMZARIC) 28-10 MG CP24 Take 1 capsule by mouth daily. 07/26/18   Mar Daring, PA-C  mirtazapine (REMERON) 30 MG tablet Take 1 tablet (30 mg total) by mouth at bedtime. 07/26/18   Mar Daring, PA-C  omeprazole (PRILOSEC) 20 MG capsule Take 1 capsule (20 mg total) by mouth every morning. 07/26/18   Mar Daring, PA-C  QUEtiapine (SEROQUEL XR) 300 MG 24 hr tablet Take 2 tablets (600 mg total) by mouth at bedtime. 07/26/18  Fenton Malling M, PA-C  simvastatin (ZOCOR) 20 MG tablet Take 1 tablet (20 mg total) by mouth at bedtime. 07/26/18   Mar Daring, PA-C  tamsulosin (FLOMAX) 0.4 MG CAPS capsule Take 1 capsule (0.4 mg total) by mouth at bedtime. 07/26/18   Mar Daring, PA-C  Vitamin D, Ergocalciferol, (DRISDOL) 50000 units CAPS capsule Take 1 capsule (50,000 Units total) by mouth every 7 (seven) days. 01/20/18   Mar Daring, PA-C    No Known Allergies  History reviewed. No pertinent family history.  Social History Social History   Tobacco Use  . Smoking status:  Current Every Day Smoker    Packs/day: 0.20    Types: Cigarettes  . Smokeless tobacco: Never Used  . Tobacco comment: pt smokes 1 cigarette a day  Substance Use Topics  . Alcohol use: No  . Drug use: No    Review of Systems Constitutional: Negative for fever. Cardiovascular: Negative for chest pain. Respiratory: Negative for shortness of breath. Gastrointestinal: Negative for abdominal pain, vomiting Genitourinary: Negative for urinary compaints Musculoskeletal: Negative for musculoskeletal complaints Skin: Negative for skin complaints  Neurological: Negative for headache All other ROS negative  ____________________________________________   PHYSICAL EXAM:  VITAL SIGNS: ED Triage Vitals [08/27/18 2130]  Enc Vitals Group     BP (!) 132/97     Pulse Rate 90     Resp 20     Temp 98 F (36.7 C)     Temp Source Oral     SpO2 98 %     Weight 150 lb (68 kg)     Height 5\' 11"  (1.803 m)     Head Circumference      Peak Flow      Pain Score 0     Pain Loc      Pain Edu?      Excl. in [place]?    Constitutional: Alert and oriented. Well appearing and in no distress. Eyes: Normal exam ENT   Head: Normocephalic and atraumatic.   Mouth/Throat: Mucous membranes are moist. Cardiovascular: Normal rate, regular rhythm.  Respiratory: Normal respiratory effort without tachypnea nor retractions. Breath sounds are clear  Gastrointestinal: Soft and nontender. No distention.   Musculoskeletal: Nontender with normal range of motion in all extremities.  Neurologic:  Normal speech and language. No gross focal neurologic deficits  Skin:  Skin is warm, dry and intact.  Psychiatric: Mood and affect are normal.    INITIAL IMPRESSION / ASSESSMENT AND PLAN / ED COURSE  Pertinent labs & imaging results that were available during my care of the patient were reviewed by me and considered in my medical decision making (see chart for details).  Patient presents to the emergency department  trying to be placed into a new group home per patient.  States he is not happy with his current group home.  Patient denies any SI or HI, has no medical concerns tonight.  Patient has a normal physical examination.  States he was getting cold outside and he did not want to sleep outside so he came to the emergency department.  Overall the patient appears extremely well, given no current psychiatric or medical complaints I discussed with the patient he needs to return to his group home if he is not happy with the group home he needs to discuss it with his Education officer, museum.  Patient agreeable to plan of care.  We will discharge from the emergency department.  ____________________________________________   FINAL CLINICAL IMPRESSION(S) / ED DIAGNOSES  Medical screening exam    Harvest Dark, MD 08/27/18 2208

## 2018-08-28 NOTE — ED Notes (Signed)
Called contact [phone] spoke with group home employee and he confirmed patient does live at group home.  Group home rep. States pt. Was taken uptown but did not return.  Rep. Stated a person from group home could pick up patient in the morning before lunch.

## 2018-08-28 NOTE — ED Notes (Signed)
Pt walked out of ER and is waiting in lobby. Group home, first nurse and EDP made aware. Pt does NOT have a legal guardian. Pt was voluntary. Pt refused VS. Denies SI/HI and pain. Pt told this Probation officer he will wait for his ride in the lobby.

## 2018-08-28 NOTE — ED Notes (Signed)
Pt walked out of ER and is waiting in lobby.  Pt refused to return to his bed despite effects of 2 staff members.  RN called patient's group home and made them aware of the situation. Patient does NOT have a legal guardian.  RN brought patient his discharge paperwork. Pt denies SI/HI and pain.  First nurse also aware of situation.

## 2018-08-28 NOTE — ED Notes (Signed)
Pt. Stated he does not want to return to group home he has been living at for the past 18 months.  Pt. States "only 1 other person is white and he is a redneck, I'm from [place]"   "I would like to go back to my old group home on [address], the group home manager is Nance Pear".  Patient has no other complaint at this time.

## 2018-08-28 NOTE — ED Notes (Signed)
Pt offered assistance to put on clothes but refused and said "dont touch me". Pt finished putting on clothes and walked out.

## 2018-09-06 ENCOUNTER — Ambulatory Visit (INDEPENDENT_AMBULATORY_CARE_PROVIDER_SITE_OTHER): Payer: Medicare Other | Admitting: Physician Assistant

## 2018-09-06 ENCOUNTER — Encounter: Payer: Self-pay | Admitting: Physician Assistant

## 2018-09-06 VITALS — BP 102/70 | HR 95 | Temp 98.2°F | Resp 16 | Wt 151.0 lb

## 2018-09-06 DIAGNOSIS — G479 Sleep disorder, unspecified: Secondary | ICD-10-CM | POA: Diagnosis not present

## 2018-09-06 DIAGNOSIS — F319 Bipolar disorder, unspecified: Secondary | ICD-10-CM

## 2018-09-06 DIAGNOSIS — F200 Paranoid schizophrenia: Secondary | ICD-10-CM | POA: Diagnosis not present

## 2018-09-06 DIAGNOSIS — Z23 Encounter for immunization: Secondary | ICD-10-CM | POA: Diagnosis not present

## 2018-09-06 NOTE — Progress Notes (Signed)
Established Patient Office Visit  Subjective:  Patient ID: Joseph Hemphill., male    DOB: 1951-04-09  Age: 67 y.o. MRN: [id number]  CC:  Chief Complaint  Patient presents with  . Follow-up    HPI  Joseph Coffin. presents for Follow up for chronic issues  The patient was last seen for this 6 weeks ago. Changes made at last visit include increase Seroquel to 600 mg for sleep and hallucinations.  He reports good compliance with treatment. He feels that condition is Unchanged. He is not having side effects.   He reports he is sleeping 6 hours per night. Not having any nighttime awakenings. Group home worker that accompanied him today mentions he has some nights where he will not take his medications.  ------------------------------------------------------------------------------------   Past Medical History:  Diagnosis Date  . Bipolar 1 disorder ([place])   . Schizo affective schizophrenia [facility])     Past Surgical History:  Procedure Laterality Date  . APPENDECTOMY    . HERNIA REPAIR      No family history on file.  Social History   Socioeconomic History  . Marital status: Single    Spouse name: Not on file  . Number of children: Not on file  . Years of education: Not on file  . Highest education level: Not on file  Occupational History  . Not on file  Social Needs  . Financial resource strain: Not on file  . Food insecurity:    Worry: Not on file    Inability: Not on file  . Transportation needs:    Medical: Not on file    Non-medical: Not on file  Tobacco Use  . Smoking status: Current Every Day Smoker    Packs/day: 0.20    Types: Cigarettes  . Smokeless tobacco: Never Used  . Tobacco comment: pt smokes 1 cigarette a day  Substance and Sexual Activity  . Alcohol use: No  . Drug use: No  . Sexual activity: Not on file  Lifestyle  . Physical activity:    Days per week: Not on file    Minutes per session: Not on file  . Stress: Not on file    Relationships  . Social connections:    Talks on phone: Not on file    Gets together: Not on file    Attends religious service: Not on file    Active member of club or organization: Not on file    Attends meetings of clubs or organizations: Not on file    Relationship status: Not on file  . Intimate partner violence:    Fear of current or ex partner: Not on file    Emotionally abused: Not on file    Physically abused: Not on file    Forced sexual activity: Not on file  Other Topics Concern  . Not on file  Social History Narrative  . Not on file    Outpatient Medications Prior to Visit  Medication Sig Dispense Refill  . amantadine (SYMMETREL) 100 MG capsule Take 1 capsule (100 mg total) by mouth 2 (two) times daily. 180 capsule 1  . cetirizine (ZYRTEC) 10 MG tablet Take 1 tablet (10 mg total) by mouth daily. 90 tablet 1  . chlorproMAZINE (THORAZINE) 25 MG tablet TAKE (1) TABLET BY MOUTH THREE TIMES A DAY. *NOTE DOSE* 90 tablet 5  . cyanocobalamin 1000 MCG tablet Take 1 tablet (1,000 mcg total) by mouth daily. 90 tablet 1  . divalproex (DEPAKOTE) 500 MG  DR tablet Take 1 tablet (500 mg total) by mouth every 12 (twelve) hours. 90 tablet 1  . Fluticasone-Salmeterol (ADVAIR) 250-50 MCG/DOSE AEPB Inhale 1 puff into the lungs 2 (two) times daily. 180 each 1  . gabapentin (NEURONTIN) 400 MG capsule Take 1 capsule (400 mg total) by mouth every morning. 90 capsule 1  . Icosapent Ethyl (VASCEPA) 1 g CAPS Take 2 capsules (2 g total) by mouth 2 (two) times daily. 180 capsule 1  . LORazepam (ATIVAN) 0.5 MG tablet Take 1 tablet (0.5 mg total) by mouth at bedtime. 90 tablet 1  . Memantine HCl-Donepezil HCl (NAMZARIC) 28-10 MG CP24 Take 1 capsule by mouth daily. 90 capsule 1  . mirtazapine (REMERON) 30 MG tablet Take 1 tablet (30 mg total) by mouth at bedtime. 90 tablet 1  . omeprazole (PRILOSEC) 20 MG capsule Take 1 capsule (20 mg total) by mouth every morning. 90 capsule 1  . QUEtiapine (SEROQUEL  XR) 300 MG 24 hr tablet Take 2 tablets (600 mg total) by mouth at bedtime. 180 tablet 1  . simvastatin (ZOCOR) 20 MG tablet Take 1 tablet (20 mg total) by mouth at bedtime. 90 tablet 1  . tamsulosin (FLOMAX) 0.4 MG CAPS capsule Take 1 capsule (0.4 mg total) by mouth at bedtime. 90 capsule 1  . Vitamin D, Ergocalciferol, (DRISDOL) 50000 units CAPS capsule Take 1 capsule (50,000 Units total) by mouth every 7 (seven) days. 12 capsule 1  . donepezil (ARICEPT) 5 MG tablet Take by mouth.     No facility-administered medications prior to visit.     No Known Allergies  ROS Review of Systems  Constitutional: Negative.   Respiratory: Negative.   Cardiovascular: Negative.   Neurological: Negative.       Objective:    Physical Exam  Constitutional: He appears well-developed and well-nourished. No distress.  HENT:  Head: Normocephalic and atraumatic.  Neck: Normal range of motion. Neck supple.  Cardiovascular: Normal rate, regular rhythm and normal heart sounds. Exam reveals no gallop and no friction rub.  No murmur heard. Pulmonary/Chest: Effort normal and breath sounds normal. No respiratory distress. He has no wheezes. He has no rales.  Skin: He is not diaphoretic.  Vitals reviewed.   BP 102/70 (BP Location: Left Arm, Patient Position: Sitting, Cuff Size: Normal)   Pulse 95   Temp 98.2 F (36.8 C) (Oral)   Resp 16   Wt 151 lb (68.5 kg)   SpO2 96%   BMI 21.06 kg/m  Wt Readings from Last 3 Encounters:  09/06/18 151 lb (68.5 kg)  08/27/18 150 lb (68 kg)  07/26/18 149 lb 3.2 oz (67.7 kg)     Health Maintenance Due  Topic Date Due  . Hepatitis C Screening  1951-03-05  . TETANUS/TDAP  10/20/1969  . COLONOSCOPY  10/20/2000  . PNA vac Low Risk Adult (1 of 2 - PCV13) 10/21/2015  . INFLUENZA VACCINE  05/06/2018    There are no preventive care reminders to display for this patient.  Lab Results  Component Value Date   TSH 1.629 01/01/2018   Lab Results  Component Value  Date   WBC 6.4 05/21/2018   HGB 13.6 05/21/2018   HCT 40.2 05/21/2018   MCV 97.1 05/21/2018   PLT 168 05/21/2018   Lab Results  Component Value Date   NA 143 05/21/2018   K 3.9 05/21/2018   CO2 25 05/21/2018   GLUCOSE 119 (H) 05/21/2018   BUN 22 05/21/2018   CREATININE 0.93 05/21/2018  BILITOT 0.7 05/21/2018   ALKPHOS 83 05/21/2018   AST 25 05/21/2018   ALT 22 05/21/2018   PROT 7.1 05/21/2018   ALBUMIN 3.9 05/21/2018   CALCIUM 8.8 (L) 05/21/2018   ANIONGAP 9 05/21/2018   Lab Results  Component Value Date   CHOL 94 01/01/2018   Lab Results  Component Value Date   HDL 26 (L) 01/01/2018   Lab Results  Component Value Date   LDLCALC 55 01/01/2018   Lab Results  Component Value Date   TRIG 65 01/01/2018   Lab Results  Component Value Date   CHOLHDL 3.6 01/01/2018   Lab Results  Component Value Date   HGBA1C 5.3 01/01/2018      Assessment & Plan:   1. Need for influenza vaccination Flu vaccine given today without complication. Patient sat upright for 15 minutes to check for adverse reaction before being released. - Flu vaccine HIGH DOSE PF (Fluzone High dose)  2. Bipolar 1 disorder (HCC) Stable today. Continue all medications. Discussed importance of taking medications daily. I will see him back in 3 months.   3. Paranoid schizophrenia ([place]) See above medical treatment plan.  4. Sleep disturbance See above medical treatment plan.

## 2018-10-01 ENCOUNTER — Other Ambulatory Visit: Payer: Self-pay | Admitting: Physician Assistant

## 2018-10-01 DIAGNOSIS — K219 Gastro-esophageal reflux disease without esophagitis: Secondary | ICD-10-CM

## 2018-10-01 MED ORDER — OMEPRAZOLE 20 MG PO CPDR: 20.0000 mg | DELAYED_RELEASE_CAPSULE | Freq: Every morning | ORAL | 1 refills | Status: DC

## 2018-10-01 NOTE — Telephone Encounter (Signed)
[facility] faxed refill request for the following medications:  omeprazole (PRILOSEC) 20 MG capsule   Date written: 07/26/2018  Last dispensed: 08/30/2018  Please advise.

## 2018-10-18 ENCOUNTER — Other Ambulatory Visit: Payer: Self-pay | Admitting: Physician Assistant

## 2018-10-18 DIAGNOSIS — L509 Urticaria, unspecified: Secondary | ICD-10-CM

## 2018-10-18 MED ORDER — CETIRIZINE HCL 10 MG PO TABS: 10.0000 mg | ORAL_TABLET | Freq: Every day | ORAL | 1 refills | Status: DC

## 2018-10-18 NOTE — Telephone Encounter (Signed)
[facility] faxed refill request for the following medications:  cetirizine (ZYRTEC) 10 MG tablet  Qty: 30  Date written: 07/26/2018  Last dispensed: 09/15/2018  Please advise.

## 2018-12-01 DIAGNOSIS — F3112 Bipolar disorder, current episode manic without psychotic features, moderate: Secondary | ICD-10-CM | POA: Diagnosis not present

## 2018-12-06 ENCOUNTER — Other Ambulatory Visit: Payer: Self-pay | Admitting: Physician Assistant

## 2018-12-06 ENCOUNTER — Encounter: Payer: Self-pay | Admitting: Physician Assistant

## 2018-12-06 ENCOUNTER — Ambulatory Visit (INDEPENDENT_AMBULATORY_CARE_PROVIDER_SITE_OTHER): Payer: Medicare Other | Admitting: Physician Assistant

## 2018-12-06 VITALS — BP 100/60 | HR 84 | Temp 97.9°F | Resp 16 | Wt 163.0 lb

## 2018-12-06 DIAGNOSIS — J418 Mixed simple and mucopurulent chronic bronchitis: Secondary | ICD-10-CM | POA: Insufficient documentation

## 2018-12-06 DIAGNOSIS — E441 Mild protein-calorie malnutrition: Secondary | ICD-10-CM | POA: Insufficient documentation

## 2018-12-06 DIAGNOSIS — F200 Paranoid schizophrenia: Secondary | ICD-10-CM

## 2018-12-06 DIAGNOSIS — F250 Schizoaffective disorder, bipolar type: Secondary | ICD-10-CM | POA: Diagnosis not present

## 2018-12-06 DIAGNOSIS — F259 Schizoaffective disorder, unspecified: Secondary | ICD-10-CM

## 2018-12-06 MED ORDER — BENZONATATE 200 MG PO CAPS: 200.0000 mg | ORAL_CAPSULE | Freq: Three times a day (TID) | ORAL | 0 refills | Status: DC | PRN

## 2018-12-06 MED ORDER — AZITHROMYCIN 250 MG PO TABS: ORAL_TABLET | ORAL | 0 refills | Status: DC

## 2018-12-06 MED ORDER — ALBUTEROL SULFATE HFA 108 (90 BASE) MCG/ACT IN AERS: 2.0000 | INHALATION_SPRAY | Freq: Four times a day (QID) | RESPIRATORY_TRACT | 0 refills | Status: DC | PRN

## 2018-12-06 NOTE — Progress Notes (Signed)
Patient: Joseph Holt. Male    DOB: Oct 19, 1950   68 y.o.   MRN: [id number] Visit Date: 12/06/2018  Today's Provider: Mar Daring, PA-C   Chief Complaint  Patient presents with  . Follow-up   Subjective:     HPI  Follow up for Bipolar Disorder:  The patient was last seen for this 3 months ago. Changes made at last visit include none.  He reports good compliance with treatment. He feels that condition is Unchanged. He is not having side effects.   ------------------------------------------------------------------------------------  Follow up for Paranoid Schizophrenia:  The patient was last seen for this 3 months ago. Changes made at last visit include none.  He reports good compliance with treatment. He feels that condition is Unchanged. He is not having side effects.   Assistant with him today feels he is sleeping a little better and reports no one has complained about him recently.  ------------------------------------------------------------------------------------  No Known Allergies   Current Outpatient Medications:  .  amantadine (SYMMETREL) 100 MG capsule, Take 1 capsule (100 mg total) by mouth 2 (two) times daily., Disp: 180 capsule, Rfl: 1 .  cetirizine (ZYRTEC) 10 MG tablet, Take 1 tablet (10 mg total) by mouth daily., Disp: 90 tablet, Rfl: 1 .  chlorproMAZINE (THORAZINE) 25 MG tablet, TAKE (1) TABLET BY MOUTH THREE TIMES A DAY. *NOTE DOSE*, Disp: 90 tablet, Rfl: 5 .  cyanocobalamin 1000 MCG tablet, Take 1 tablet (1,000 mcg total) by mouth daily., Disp: 90 tablet, Rfl: 1 .  divalproex (DEPAKOTE) 500 MG DR tablet, Take 1 tablet (500 mg total) by mouth every 12 (twelve) hours., Disp: 90 tablet, Rfl: 1 .  Fluticasone-Salmeterol (ADVAIR) 250-50 MCG/DOSE AEPB, Inhale 1 puff into the lungs 2 (two) times daily., Disp: 180 each, Rfl: 1 .  gabapentin (NEURONTIN) 400 MG capsule, Take 1 capsule (400 mg total) by mouth every morning., Disp: 90  capsule, Rfl: 1 .  Icosapent Ethyl (VASCEPA) 1 g CAPS, Take 2 capsules (2 g total) by mouth 2 (two) times daily., Disp: 180 capsule, Rfl: 1 .  LORazepam (ATIVAN) 0.5 MG tablet, Take 1 tablet (0.5 mg total) by mouth at bedtime., Disp: 90 tablet, Rfl: 1 .  Memantine HCl-Donepezil HCl (NAMZARIC) 28-10 MG CP24, Take 1 capsule by mouth daily., Disp: 90 capsule, Rfl: 1 .  mirtazapine (REMERON) 30 MG tablet, Take 1 tablet (30 mg total) by mouth at bedtime., Disp: 90 tablet, Rfl: 1 .  omeprazole (PRILOSEC) 20 MG capsule, Take 1 capsule (20 mg total) by mouth every morning., Disp: 90 capsule, Rfl: 1 .  QUEtiapine (SEROQUEL XR) 300 MG 24 hr tablet, Take 2 tablets (600 mg total) by mouth at bedtime., Disp: 180 tablet, Rfl: 1 .  simvastatin (ZOCOR) 20 MG tablet, Take 1 tablet (20 mg total) by mouth at bedtime., Disp: 90 tablet, Rfl: 1 .  tamsulosin (FLOMAX) 0.4 MG CAPS capsule, Take 1 capsule (0.4 mg total) by mouth at bedtime., Disp: 90 capsule, Rfl: 1 .  Vitamin D, Ergocalciferol, (DRISDOL) 50000 units CAPS capsule, Take 1 capsule (50,000 Units total) by mouth every 7 (seven) days., Disp: 12 capsule, Rfl: 1  Review of Systems  Constitutional: Negative for appetite change, chills and fever.  HENT: Negative.   Respiratory: Negative for chest tightness, shortness of breath and wheezing.   Cardiovascular: Negative for chest pain and palpitations.  Gastrointestinal: Negative for abdominal pain, nausea and vomiting.  Genitourinary: Negative.   Neurological: Positive for tremors.  Psychiatric/Behavioral: Positive  for agitation, hallucinations and sleep disturbance.    Social History   Tobacco Use  . Smoking status: Current Every Day Smoker    Packs/day: 0.20    Types: Cigarettes  . Smokeless tobacco: Never Used  . Tobacco comment: pt smokes 1 cigarette a day  Substance Use Topics  . Alcohol use: No      Objective:   BP 100/60 (BP Location: Left Arm, Patient Position: Sitting, Cuff Size: Small)    Pulse 84   Temp 97.9 F (36.6 C) (Oral)   Resp 16   Wt 163 lb (73.9 kg)   SpO2 96% Comment: room air  BMI 22.73 kg/m  Vitals:   12/06/18 1547  BP: 100/60  Pulse: 84  Resp: 16  Temp: 97.9 F (36.6 C)  TempSrc: Oral  SpO2: 96%  Weight: 163 lb (73.9 kg)     Physical Exam Vitals signs reviewed.  Constitutional:      General: He is not in acute distress.    Appearance: Normal appearance. He is well-developed and normal weight. He is not ill-appearing or diaphoretic.  HENT:     Head: Normocephalic and atraumatic.  Neck:     Musculoskeletal: Normal range of motion and neck supple.  Cardiovascular:     Rate and Rhythm: Normal rate and regular rhythm.     Heart sounds: Normal heart sounds. No murmur. No friction rub. No gallop.   Pulmonary:     Effort: Pulmonary effort is normal. No respiratory distress.     Breath sounds: Wheezing present. No rales.  Neurological:     Mental Status: He is alert.  Psychiatric:        Attention and Perception: Attention normal.        Mood and Affect: Mood normal.        Speech: Speech normal.        Behavior: Behavior is cooperative.         Assessment & Plan    1. Paranoid schizophrenia ([place]) Stable. Will check labs as below for his psychiatrist at [facility]. I will fax once results received.  - CBC w/Diff/Platelet - Comprehensive Metabolic Panel (CMET) - Lipid Profile - Valproic Acid level  2. Schizo affective schizophrenia ([place]) See above medical treatment plan. - CBC w/Diff/Platelet - Comprehensive Metabolic Panel (CMET) - Lipid Profile - Valproic Acid level  3. Mild protein-calorie malnutrition ([place]) Improving. Has gained from 151 pounds to 163 pounds over the last 3 months. - CBC w/Diff/Platelet - Comprehensive Metabolic Panel (CMET) - Lipid Profile  4. Mixed simple and mucopurulent chronic bronchitis (HCC) Worsening. Will treat with zpak, albuterol inhaler and tessalon perles. Continue Advair. Push fluids. Rest. Call if  worsening.  - azithromycin (ZITHROMAX) 250 MG tablet; Take 2 tablets PO on day one, and one tablet PO daily thereafter until completed.  Dispense: 6 tablet; Refill: 0 - albuterol (PROVENTIL HFA;VENTOLIN HFA) 108 (90 Base) MCG/ACT inhaler; Inhale 2 puffs into the lungs every 6 (six) hours as needed for wheezing or shortness of breath.  Dispense: 1 Inhaler; Refill: 0 - benzonatate (TESSALON) 200 MG capsule; Take 1 capsule (200 mg total) by mouth 3 (three) times daily as needed for cough.  Dispense: 30 capsule; Refill: 0 - CBC w/Diff/Platelet - Comprehensive Metabolic Panel (CMET) - Lipid Profile     Mar Daring, PA-C  [facility]

## 2018-12-06 NOTE — Patient Instructions (Signed)
Acute Bronchitis, Adult Acute bronchitis is when air tubes (bronchi) in the lungs suddenly get swollen. The condition can make it hard to breathe. It can also cause these symptoms:  A cough.  Coughing up clear, yellow, or green mucus.  Wheezing.  Chest congestion.  Shortness of breath.  A fever.  Body aches.  Chills.  A sore throat. Follow these instructions at home:  Medicines  Take over-the-counter and prescription medicines only as told by your doctor.  If you were prescribed an antibiotic medicine, take it as told by your doctor. Do not stop taking the antibiotic even if you start to feel better. General instructions  Rest.  Drink enough fluids to keep your pee (urine) pale yellow.  Avoid smoking and secondhand smoke. If you smoke and you need help quitting, ask your doctor. Quitting will help your lungs heal faster.  Use an inhaler, cool mist vaporizer, or humidifier as told by your doctor.  Keep all follow-up visits as told by your doctor. This is important. How is this prevented? To lower your risk of getting this condition again:  Wash your hands often with soap and water. If you cannot use soap and water, use hand sanitizer.  Avoid contact with people who have cold symptoms.  Try not to touch your hands to your mouth, nose, or eyes.  Make sure to get the flu shot every year. Contact a doctor if:  Your symptoms do not get better in 2 weeks. Get help right away if:  You cough up blood.  You have chest pain.  You have very bad shortness of breath.  You become dehydrated.  You faint (pass out) or keep feeling like you are going to pass out.  You keep throwing up (vomiting).  You have a very bad headache.  Your fever or chills gets worse. This information is not intended to replace advice given to you by your health care provider. Make sure you discuss any questions you have with your health care provider. Document Released: 03/10/2008 Document  Revised: 05/06/2017 Document Reviewed: 03/12/2016 Elsevier Interactive Patient Education  2019 [employer]  

## 2018-12-07 LAB — COMPREHENSIVE METABOLIC PANEL
ALK PHOS: 89 IU/L (ref 39–117)
ALT: 18 IU/L (ref 0–44)
AST: 22 IU/L (ref 0–40)
Albumin/Globulin Ratio: 2.2 (ref 1.2–2.2)
Albumin: 4.8 g/dL (ref 3.8–4.8)
BUN/Creatinine Ratio: 21 (ref 10–24)
BUN: 24 mg/dL (ref 8–27)
Bilirubin Total: 0.4 mg/dL (ref 0.0–1.2)
CALCIUM: 9.2 mg/dL (ref 8.6–10.2)
CO2: 22 mmol/L (ref 20–29)
CREATININE: 1.14 mg/dL (ref 0.76–1.27)
Chloride: 102 mmol/L (ref 96–106)
GFR calc Af Amer: 76 mL/min/{1.73_m2} (ref >59)
GFR, EST NON AFRICAN AMERICAN: 66 mL/min/{1.73_m2} (ref >59)
GLUCOSE: 112 mg/dL — AB (ref 65–99)
Globulin, Total: 2.2 g/dL (ref 1.5–4.5)
Potassium: 4.2 mmol/L (ref 3.5–5.2)
SODIUM: 142 mmol/L (ref 134–144)
Total Protein: 7 g/dL (ref 6.0–8.5)

## 2018-12-07 LAB — CBC WITH DIFFERENTIAL/PLATELET
BASOS ABS: 0.1 10*3/uL (ref 0.0–0.2)
BASOS: 1 %
EOS (ABSOLUTE): 0.1 10*3/uL (ref 0.0–0.4)
Eos: 2 %
HEMATOCRIT: 41.2 % (ref 37.5–51.0)
Hemoglobin: 13.7 g/dL (ref 13.0–17.7)
IMMATURE GRANULOCYTES: 1 %
Immature Grans (Abs): 0.1 10*3/uL (ref 0.0–0.1)
Lymphocytes Absolute: 1.6 10*3/uL (ref 0.7–3.1)
Lymphs: 31 %
MCH: 32 pg (ref 26.6–33.0)
MCHC: 33.3 g/dL (ref 31.5–35.7)
MCV: 96 fL (ref 79–97)
MONOS ABS: 0.6 10*3/uL (ref 0.1–0.9)
Monocytes: 11 %
NEUTROS PCT: 54 %
Neutrophils Absolute: 2.8 10*3/uL (ref 1.4–7.0)
PLATELETS: 159 10*3/uL (ref 150–450)
RBC: 4.28 x10E6/uL (ref 4.14–5.80)
RDW: 13.3 % (ref 11.6–15.4)
WBC: 5.2 10*3/uL (ref 3.4–10.8)

## 2018-12-07 LAB — LIPID PANEL
CHOL/HDL RATIO: 2.9 ratio (ref 0.0–5.0)
CHOLESTEROL TOTAL: 120 mg/dL (ref 100–199)
HDL: 41 mg/dL (ref >39)
LDL Calculated: 49 mg/dL (ref 0–99)
TRIGLYCERIDES: 151 mg/dL — AB (ref 0–149)
VLDL Cholesterol Cal: 30 mg/dL (ref 5–40)

## 2018-12-07 LAB — VALPROIC ACID LEVEL: Valproic Acid Lvl: 73 ug/mL (ref 50–100)

## 2018-12-08 ENCOUNTER — Telehealth: Payer: Self-pay | Admitting: *Deleted

## 2018-12-08 NOTE — Telephone Encounter (Signed)
-----   Message from Mar Daring, [place] sent at 12/07/2018  5:39 PM EST ----- All labs are within normal limits and stable.  These labs need to be faxed to Dr. Leonides Schanz at [facility] please. Thanks! -JB

## 2018-12-08 NOTE — Telephone Encounter (Signed)
Mrs. Izora Gala at home care advised as below.

## 2018-12-08 NOTE — Telephone Encounter (Signed)
Labs faxed to [phone]

## 2018-12-08 NOTE — Telephone Encounter (Signed)
Unable to reach pt's wife at this time was advised to call back after 9:30 am today.

## 2018-12-16 ENCOUNTER — Other Ambulatory Visit: Payer: Self-pay | Admitting: Physician Assistant

## 2018-12-16 DIAGNOSIS — E7800 Pure hypercholesterolemia, unspecified: Secondary | ICD-10-CM

## 2018-12-16 DIAGNOSIS — E781 Pure hyperglyceridemia: Secondary | ICD-10-CM

## 2019-01-21 ENCOUNTER — Other Ambulatory Visit: Payer: Self-pay | Admitting: Physician Assistant

## 2019-01-21 DIAGNOSIS — F200 Paranoid schizophrenia: Secondary | ICD-10-CM

## 2019-01-21 MED ORDER — LORAZEPAM 0.5 MG PO TABS: 0.5000 mg | ORAL_TABLET | Freq: Every day | ORAL | 1 refills | Status: DC

## 2019-01-21 NOTE — Telephone Encounter (Signed)
[facility] faxed refill request for the following medications:  LORazepam (ATIVAN) 0.5 MG tablet    Please advise.

## 2019-01-31 ENCOUNTER — Other Ambulatory Visit: Payer: Self-pay | Admitting: Physician Assistant

## 2019-01-31 DIAGNOSIS — E781 Pure hyperglyceridemia: Secondary | ICD-10-CM

## 2019-01-31 DIAGNOSIS — E7800 Pure hypercholesterolemia, unspecified: Secondary | ICD-10-CM

## 2019-02-07 ENCOUNTER — Other Ambulatory Visit: Payer: Self-pay | Admitting: Physician Assistant

## 2019-02-07 DIAGNOSIS — F0281 Dementia in other diseases classified elsewhere with behavioral disturbance: Secondary | ICD-10-CM

## 2019-02-07 DIAGNOSIS — F02818 Dementia in other diseases classified elsewhere, unspecified severity, with other behavioral disturbance: Secondary | ICD-10-CM

## 2019-02-07 MED ORDER — MEMANTINE HCL-DONEPEZIL HCL ER 28-10 MG PO CP24: 1.0000 | ORAL_CAPSULE | Freq: Every day | ORAL | 1 refills | Status: DC

## 2019-02-07 NOTE — Telephone Encounter (Signed)
[facility] faxed refill request for the following medications:  Memantine HCl-Donepezil HCl (NAMZARIC) 28-10 MG CP24     Please advise

## 2019-03-04 ENCOUNTER — Other Ambulatory Visit: Payer: Self-pay | Admitting: Physician Assistant

## 2019-03-04 DIAGNOSIS — E538 Deficiency of other specified B group vitamins: Secondary | ICD-10-CM

## 2019-03-04 MED ORDER — CYANOCOBALAMIN 1000 MCG PO TABS: 1000.0000 ug | ORAL_TABLET | Freq: Every day | ORAL | 1 refills | Status: DC

## 2019-03-04 NOTE — Telephone Encounter (Signed)
[facility] faxed refill request for the following medications:  vitamin B-12 1000 MCG tablet  Take one tablet by mouth every day  Date written: 07/26/2018  Last dispensed: 02/04/2019  Please advise.

## 2019-05-25 DIAGNOSIS — Z20828 Contact with and (suspected) exposure to other viral communicable diseases: Secondary | ICD-10-CM | POA: Diagnosis not present

## 2019-05-25 DIAGNOSIS — R05 Cough: Secondary | ICD-10-CM | POA: Diagnosis not present

## 2019-05-25 DIAGNOSIS — R0981 Nasal congestion: Secondary | ICD-10-CM | POA: Diagnosis not present

## 2019-05-30 ENCOUNTER — Other Ambulatory Visit: Payer: Self-pay | Admitting: Physician Assistant

## 2019-05-30 DIAGNOSIS — F200 Paranoid schizophrenia: Secondary | ICD-10-CM

## 2019-05-30 DIAGNOSIS — R4189 Other symptoms and signs involving cognitive functions and awareness: Secondary | ICD-10-CM

## 2019-05-30 MED ORDER — CHLORPROMAZINE HCL 25 MG PO TABS: ORAL_TABLET | ORAL | 5 refills | Status: DC

## 2019-05-30 NOTE — Telephone Encounter (Signed)
Joseph Holt  [phone] Calling in for pt:  Pt needing a refill on:  chlorproMAZINE (THORAZINE) 25 MG tablet  Please fill at:  [place], [place] [phone] (Phone) [phone] (Fax)     Thanks, [facility]

## 2019-05-30 NOTE — Addendum Note (Signed)
Addended by: Mar Daring on: 05/30/2019 05:09 PM   Modules accepted: Orders

## 2019-05-30 NOTE — Addendum Note (Signed)
Addended by: Mar Daring on: 05/30/2019 05:10 PM   Modules accepted: Orders

## 2019-06-17 ENCOUNTER — Other Ambulatory Visit: Payer: Self-pay

## 2019-06-17 DIAGNOSIS — F319 Bipolar disorder, unspecified: Secondary | ICD-10-CM

## 2019-06-17 DIAGNOSIS — F200 Paranoid schizophrenia: Secondary | ICD-10-CM

## 2019-06-17 DIAGNOSIS — F259 Schizoaffective disorder, unspecified: Secondary | ICD-10-CM

## 2019-06-17 MED ORDER — QUETIAPINE FUMARATE ER 300 MG PO TB24: 600.0000 mg | ORAL_TABLET | Freq: Every day | ORAL | 1 refills | Status: DC

## 2019-06-24 DIAGNOSIS — R0981 Nasal congestion: Secondary | ICD-10-CM | POA: Diagnosis not present

## 2019-06-24 DIAGNOSIS — Z20828 Contact with and (suspected) exposure to other viral communicable diseases: Secondary | ICD-10-CM | POA: Diagnosis not present

## 2019-06-24 DIAGNOSIS — R05 Cough: Secondary | ICD-10-CM | POA: Diagnosis not present

## 2019-07-13 ENCOUNTER — Telehealth: Payer: Self-pay | Admitting: Physician Assistant

## 2019-07-13 DIAGNOSIS — R4189 Other symptoms and signs involving cognitive functions and awareness: Secondary | ICD-10-CM

## 2019-07-13 DIAGNOSIS — F200 Paranoid schizophrenia: Secondary | ICD-10-CM

## 2019-07-13 MED ORDER — DIVALPROEX SODIUM 500 MG PO DR TAB: 500.0000 mg | DELAYED_RELEASE_TABLET | Freq: Two times a day (BID) | ORAL | 1 refills | Status: DC

## 2019-07-13 NOTE — Telephone Encounter (Signed)
Izora Gala w/ [facility] a refill on: divalproex (DEPAKOTE) 500 MG DR tablet  Please fill at: [place], [place] [phone] (Phone) [phone] (Fax)   Thanks, [place]

## 2019-07-13 NOTE — Telephone Encounter (Signed)
refilled 

## 2019-07-25 ENCOUNTER — Ambulatory Visit (INDEPENDENT_AMBULATORY_CARE_PROVIDER_SITE_OTHER): Payer: Medicare Other | Admitting: Physician Assistant

## 2019-07-25 ENCOUNTER — Other Ambulatory Visit: Payer: Self-pay | Admitting: Physician Assistant

## 2019-07-25 ENCOUNTER — Other Ambulatory Visit: Payer: Self-pay

## 2019-07-25 ENCOUNTER — Encounter: Payer: Self-pay | Admitting: Physician Assistant

## 2019-07-25 VITALS — BP 130/84 | HR 76 | Temp 97.3°F | Resp 16 | Wt 179.4 lb

## 2019-07-25 DIAGNOSIS — Z23 Encounter for immunization: Secondary | ICD-10-CM

## 2019-07-25 DIAGNOSIS — F200 Paranoid schizophrenia: Secondary | ICD-10-CM

## 2019-07-25 DIAGNOSIS — F319 Bipolar disorder, unspecified: Secondary | ICD-10-CM | POA: Diagnosis not present

## 2019-07-25 DIAGNOSIS — F259 Schizoaffective disorder, unspecified: Secondary | ICD-10-CM | POA: Diagnosis not present

## 2019-07-25 DIAGNOSIS — F0281 Dementia in other diseases classified elsewhere with behavioral disturbance: Secondary | ICD-10-CM

## 2019-07-25 DIAGNOSIS — R4189 Other symptoms and signs involving cognitive functions and awareness: Secondary | ICD-10-CM

## 2019-07-25 DIAGNOSIS — F17200 Nicotine dependence, unspecified, uncomplicated: Secondary | ICD-10-CM | POA: Diagnosis not present

## 2019-07-25 DIAGNOSIS — N401 Enlarged prostate with lower urinary tract symptoms: Secondary | ICD-10-CM

## 2019-07-25 DIAGNOSIS — F02818 Dementia in other diseases classified elsewhere, unspecified severity, with other behavioral disturbance: Secondary | ICD-10-CM

## 2019-07-25 DIAGNOSIS — E7800 Pure hypercholesterolemia, unspecified: Secondary | ICD-10-CM

## 2019-07-25 DIAGNOSIS — J418 Mixed simple and mucopurulent chronic bronchitis: Secondary | ICD-10-CM

## 2019-07-25 DIAGNOSIS — R3914 Feeling of incomplete bladder emptying: Secondary | ICD-10-CM

## 2019-07-25 DIAGNOSIS — E781 Pure hyperglyceridemia: Secondary | ICD-10-CM

## 2019-07-25 MED ORDER — LORAZEPAM 0.5 MG PO TABS: 0.5000 mg | ORAL_TABLET | Freq: Every day | ORAL | 1 refills | Status: DC

## 2019-07-25 MED ORDER — QUETIAPINE FUMARATE ER 300 MG PO TB24: 600.0000 mg | ORAL_TABLET | Freq: Every day | ORAL | 1 refills | Status: DC

## 2019-07-25 MED ORDER — MIRTAZAPINE 30 MG PO TABS: 30.0000 mg | ORAL_TABLET | Freq: Every day | ORAL | 1 refills | Status: DC

## 2019-07-25 NOTE — Progress Notes (Signed)
Patient: Joseph Holt. Male    DOB: 1950-12-13   68 y.o.   MRN: [id number] Visit Date: 07/25/2019  Today's Provider: Mar Daring, PA-C   Chief Complaint  Patient presents with  . Follow-up   Subjective:     HPI Patient here to follow up on Paranoid Schizophrenia, Bipolar 1 disorder. He has no complaints and feels well. Needs flu and pneumonia vaccine today. Here with group home assistant, Ivin Booty. The patient nor herself have any complaints today and feel he is very well at this time.    No Known Allergies   Current Outpatient Medications:  .  albuterol (PROVENTIL HFA;VENTOLIN HFA) 108 (90 Base) MCG/ACT inhaler, Inhale 2 puffs into the lungs every 6 (six) hours as needed for wheezing or shortness of breath., Disp: 1 Inhaler, Rfl: 0 .  amantadine (SYMMETREL) 100 MG capsule, Take 1 capsule (100 mg total) by mouth 2 (two) times daily., Disp: 180 capsule, Rfl: 1 .  Ascorbic Acid (VITAMIN C) 1000 MG tablet, Take 1,000 mg by mouth daily., Disp: , Rfl:  .  cetirizine (ZYRTEC) 10 MG tablet, Take 1 tablet (10 mg total) by mouth daily., Disp: 90 tablet, Rfl: 1 .  chlorproMAZINE (THORAZINE) 25 MG tablet, TAKE (1) TABLET BY MOUTH THREE TIMES A DAY. *NOTE DOSE*, Disp: 90 tablet, Rfl: 5 .  divalproex (DEPAKOTE) 500 MG DR tablet, Take 1 tablet (500 mg total) by mouth every 12 (twelve) hours., Disp: 180 tablet, Rfl: 1 .  Fluticasone-Salmeterol (ADVAIR) 250-50 MCG/DOSE AEPB, Inhale 1 puff into the lungs 2 (two) times daily., Disp: 180 each, Rfl: 1 .  gabapentin (NEURONTIN) 400 MG capsule, Take 1 capsule (400 mg total) by mouth every morning., Disp: 90 capsule, Rfl: 1 .  LORazepam (ATIVAN) 0.5 MG tablet, Take 1 tablet (0.5 mg total) by mouth at bedtime., Disp: 90 tablet, Rfl: 1 .  Memantine HCl-Donepezil HCl (NAMZARIC) 28-10 MG CP24, Take 1 capsule by mouth daily., Disp: 90 capsule, Rfl: 1 .  mirtazapine (REMERON) 30 MG tablet, Take 1 tablet (30 mg total) by mouth at bedtime.,  Disp: 90 tablet, Rfl: 1 .  omeprazole (PRILOSEC) 20 MG capsule, Take 1 capsule (20 mg total) by mouth every morning., Disp: 90 capsule, Rfl: 1 .  QUEtiapine (SEROQUEL XR) 300 MG 24 hr tablet, Take 2 tablets (600 mg total) by mouth at bedtime., Disp: 180 tablet, Rfl: 1 .  simvastatin (ZOCOR) 20 MG tablet, TAKE ONE TABLET BY MOUTH AT BEDTIME, Disp: 90 tablet, Rfl: 1 .  tamsulosin (FLOMAX) 0.4 MG CAPS capsule, Take 1 capsule (0.4 mg total) by mouth at bedtime., Disp: 90 capsule, Rfl: 1 .  Vitamin D, Ergocalciferol, (DRISDOL) 50000 units CAPS capsule, Take 1 capsule (50,000 Units total) by mouth every 7 (seven) days., Disp: 12 capsule, Rfl: 1 .  azithromycin (ZITHROMAX) 250 MG tablet, Take 2 tablets PO on day one, and one tablet PO daily thereafter until completed. (Patient not taking: Reported on 07/25/2019), Disp: 6 tablet, Rfl: 0 .  benzonatate (TESSALON) 200 MG capsule, Take 1 capsule (200 mg total) by mouth 3 (three) times daily as needed for cough. (Patient not taking: Reported on 07/25/2019), Disp: 30 capsule, Rfl: 0 .  cyanocobalamin 1000 MCG tablet, Take 1 tablet (1,000 mcg total) by mouth daily., Disp: 90 tablet, Rfl: 1 .  VASCEPA 1 g CAPS, TAKE 2 CAPSULES BY MOUTH TWO TIMES A DAY, Disp: 120 capsule, Rfl: 10  Review of Systems  Constitutional: Negative.   Respiratory:  Negative.   Cardiovascular: Negative.   Gastrointestinal: Negative.   Neurological: Negative.   Psychiatric/Behavioral: Negative.     Social History   Tobacco Use  . Smoking status: Current Every Day Smoker    Packs/day: 0.20    Types: Cigarettes  . Smokeless tobacco: Never Used  . Tobacco comment: pt smokes 1 cigarette a day  Substance Use Topics  . Alcohol use: No      Objective:   BP 130/84 (BP Location: Left Arm, Patient Position: Sitting, Cuff Size: Normal)   Pulse 76   Temp (!) 97.3 F (36.3 C) (Other (Comment))   Resp 16   Wt 179 lb 6.4 oz (81.4 kg)   BMI 25.02 kg/m  Vitals:   07/25/19 1551  BP:  130/84  Pulse: 76  Resp: 16  Temp: (!) 97.3 F (36.3 C)  TempSrc: Other (Comment)  Weight: 179 lb 6.4 oz (81.4 kg)  Body mass index is 25.02 kg/m.   Physical Exam Vitals signs reviewed.  Constitutional:      General: He is not in acute distress.    Appearance: Normal appearance. He is well-developed and normal weight. He is not ill-appearing or diaphoretic.  HENT:     Head: Normocephalic and atraumatic.  Neck:     Musculoskeletal: Normal range of motion and neck supple.     Thyroid: No thyromegaly.     Vascular: No JVD.     Trachea: No tracheal deviation.  Cardiovascular:     Rate and Rhythm: Normal rate and regular rhythm.     Pulses: Normal pulses.     Heart sounds: Normal heart sounds. No murmur. No friction rub. No gallop.   Pulmonary:     Effort: Pulmonary effort is normal. No respiratory distress.     Breath sounds: Normal breath sounds. No wheezing or rales.  Musculoskeletal:     Right lower leg: No edema.     Left lower leg: No edema.  Lymphadenopathy:     Cervical: No cervical adenopathy.  Neurological:     General: No focal deficit present.     Mental Status: He is alert. Mental status is at baseline.      No results found for any visits on 07/25/19.     Assessment & Plan    1. Paranoid schizophrenia ([place]) Stable. Diagnosis pulled for medication refill. Continue current medical treatment plan. - QUEtiapine (SEROQUEL XR) 300 MG 24 hr tablet; Take 2 tablets (600 mg total) by mouth at bedtime.  Dispense: 180 tablet; Refill: 1 - mirtazapine (REMERON) 30 MG tablet; Take 1 tablet (30 mg total) by mouth at bedtime.  Dispense: 90 tablet; Refill: 1 - LORazepam (ATIVAN) 0.5 MG tablet; Take 1 tablet (0.5 mg total) by mouth at bedtime.  Dispense: 90 tablet; Refill: 1  2. Schizo affective schizophrenia ([place]) Stable. Diagnosis pulled for medication refill. Continue current medical treatment plan. - QUEtiapine (SEROQUEL XR) 300 MG 24 hr tablet; Take 2 tablets (600 mg  total) by mouth at bedtime.  Dispense: 180 tablet; Refill: 1  3. Bipolar 1 disorder (HCC) Stable. Diagnosis pulled for medication refill. Continue current medical treatment plan. - QUEtiapine (SEROQUEL XR) 300 MG 24 hr tablet; Take 2 tablets (600 mg total) by mouth at bedtime.  Dispense: 180 tablet; Refill: 1  4. Current smoker Patient has no desire to quit. Pneumovax given as below and tolerated well.  - Pneumococcal polysaccharide vaccine 23-valent greater than or equal to 2yo subcutaneous/IM  5. Need for influenza vaccination Flu vaccine given  today without complication. Patient sat upright for 15 minutes to check for adverse reaction before being released. - Flu Vaccine QUAD High Dose(Fluad)  6. Need for vaccination against Streptococcus pneumoniae Pneumococcal 23 Vaccine given to patient without complications. Patient sat for 15 minutes after administration and was tolerated well without adverse effects. - Pneumococcal polysaccharide vaccine 23-valent greater than or equal to 2yo subcutaneous/IM     Mar Daring, PA-C  [place]

## 2019-07-28 ENCOUNTER — Encounter: Payer: Self-pay | Admitting: Physician Assistant

## 2019-08-17 ENCOUNTER — Other Ambulatory Visit: Payer: Self-pay | Admitting: Physician Assistant

## 2019-08-17 DIAGNOSIS — F0281 Dementia in other diseases classified elsewhere with behavioral disturbance: Secondary | ICD-10-CM

## 2019-08-17 DIAGNOSIS — F200 Paranoid schizophrenia: Secondary | ICD-10-CM

## 2019-08-17 DIAGNOSIS — R251 Tremor, unspecified: Secondary | ICD-10-CM

## 2019-08-17 DIAGNOSIS — E7800 Pure hypercholesterolemia, unspecified: Secondary | ICD-10-CM

## 2019-08-17 DIAGNOSIS — K219 Gastro-esophageal reflux disease without esophagitis: Secondary | ICD-10-CM

## 2019-08-17 DIAGNOSIS — L509 Urticaria, unspecified: Secondary | ICD-10-CM

## 2019-08-17 DIAGNOSIS — R4189 Other symptoms and signs involving cognitive functions and awareness: Secondary | ICD-10-CM

## 2019-08-17 DIAGNOSIS — F02818 Dementia in other diseases classified elsewhere, unspecified severity, with other behavioral disturbance: Secondary | ICD-10-CM

## 2019-08-17 DIAGNOSIS — E781 Pure hyperglyceridemia: Secondary | ICD-10-CM

## 2019-08-18 DIAGNOSIS — R0981 Nasal congestion: Secondary | ICD-10-CM | POA: Diagnosis not present

## 2019-08-18 DIAGNOSIS — R05 Cough: Secondary | ICD-10-CM | POA: Diagnosis not present

## 2019-08-18 DIAGNOSIS — Z20828 Contact with and (suspected) exposure to other viral communicable diseases: Secondary | ICD-10-CM | POA: Diagnosis not present

## 2019-09-09 ENCOUNTER — Other Ambulatory Visit: Payer: Self-pay | Admitting: Physician Assistant

## 2019-09-09 DIAGNOSIS — E781 Pure hyperglyceridemia: Secondary | ICD-10-CM

## 2019-09-09 DIAGNOSIS — J418 Mixed simple and mucopurulent chronic bronchitis: Secondary | ICD-10-CM

## 2019-09-09 DIAGNOSIS — N401 Enlarged prostate with lower urinary tract symptoms: Secondary | ICD-10-CM

## 2019-09-09 DIAGNOSIS — E7800 Pure hypercholesterolemia, unspecified: Secondary | ICD-10-CM

## 2019-09-09 DIAGNOSIS — R3914 Feeling of incomplete bladder emptying: Secondary | ICD-10-CM

## 2019-09-14 ENCOUNTER — Other Ambulatory Visit: Payer: Self-pay | Admitting: Physician Assistant

## 2019-09-14 DIAGNOSIS — F200 Paranoid schizophrenia: Secondary | ICD-10-CM

## 2019-09-14 DIAGNOSIS — G629 Polyneuropathy, unspecified: Secondary | ICD-10-CM

## 2019-09-14 DIAGNOSIS — R4189 Other symptoms and signs involving cognitive functions and awareness: Secondary | ICD-10-CM

## 2019-09-14 NOTE — Telephone Encounter (Signed)
Requested medication (s) are due for refill today: yes  Requested medication (s) are on the active medication list: yes  Last refill: 08/29/2019  Future visit scheduled: {yes  Notes to clinic:  Not delegated    Requested Prescriptions  Pending Prescriptions Disp Refills   divalproex (DEPAKOTE) 500 MG DR tablet [Pharmacy Med Name: DIVALPROEX SOD DR 500 MG TAB] 60 tablet 0    Sig: TAKE 1 TABLET BY MOUTH TWICE DAILY. **DO NOT CRUSH**     Not Delegated - Neurology:  Anticonvulsants - Valproates Failed - 09/14/2019  3:45 PM      Failed - This refill cannot be delegated      Passed - AST in normal range and within 360 days    AST  Date Value Ref Range Status  12/06/2018 22 0 - 40 IU/L Final   SGOT(AST)  Date Value Ref Range Status  09/09/2014 38 (H) 15 - 37 Unit/L Final         Passed - ALT in normal range and within 360 days    ALT  Date Value Ref Range Status  12/06/2018 18 0 - 44 IU/L Final   SGPT (ALT)  Date Value Ref Range Status  09/09/2014 38 U/L Final    Comment:    14-63 NOTE: New Reference Range 04/25/14          Passed - HGB in normal range and within 360 days    Hemoglobin  Date Value Ref Range Status  12/06/2018 13.7 13.0 - 17.7 g/dL Final         Passed - PLT in normal range and within 360 days    Platelets  Date Value Ref Range Status  12/06/2018 159 150 - 450 x10E3/uL Final         Passed - WBC in normal range and within 360 days    WBC  Date Value Ref Range Status  12/06/2018 5.2 3.4 - 10.8 x10E3/uL Final  05/21/2018 6.4 3.8 - 10.6 K/uL Final         Passed - HCT in normal range and within 360 days    Hematocrit  Date Value Ref Range Status  12/06/2018 41.2 37.5 - 51.0 % Final         Passed - Valproic Acid (serum) in normal range and within 360 days    Valproic Acid Lvl  Date Value Ref Range Status  12/06/2018 73 50 - 100 ug/mL Final    Comment:                                    Detection Limit = 4                            <4  indicates None Detected Toxicity may occur at levels of 100-500. Measurements of free unbound valproic acid may improve the assess- ment of clinical response.          Passed - Valid encounter within last 12 months    Recent Outpatient Visits          1 month ago Paranoid schizophrenia [facility]   [place], [place], [place]   9 months ago Paranoid schizophrenia [facility])   [place], Clearnce Sorrel, [place]   1 year ago Bipolar 1 disorder [facility])   [facility] [place], [place], [place]   1 year  ago Paranoid schizophrenia [facility])   [place], Clearnce Sorrel, [place]   1 year ago Paranoid schizophrenia [facility])   [place], Clearnce Sorrel, [place]      Future Appointments            In 3 months [place], Clearnce Sorrel, PA-C [facility]            gabapentin (NEURONTIN) 400 MG capsule [employer] Med Name: GABAPENTIN 400 MG CAPSULE] 30 capsule 0    Sig: TAKE 1 TABLET BY MOUTH EVERY MORNING.     Neurology: Anticonvulsants - gabapentin Passed - 09/14/2019  3:45 PM      Passed - Valid encounter within last 12 months    Recent Outpatient Visits          1 month ago Paranoid schizophrenia [facility])   [place], [place], [place]   9 months ago Paranoid schizophrenia [facility])   [place], Clearnce Sorrel, PA-C   1 year ago Bipolar 1 disorder [facility])   [place], Clearnce Sorrel, PA-C   1 year ago Paranoid schizophrenia [facility]   [place], Clearnce Sorrel, PA-C   1 year ago Paranoid schizophrenia [facility])   [place], Clearnce Sorrel, PA-C      Future Appointments            In 3 months [place], Clearnce Sorrel, PA-C [employer] [employer], [place]

## 2019-09-15 NOTE — Telephone Encounter (Signed)
L.O.V. was on 07/25/2019.

## 2019-10-06 DIAGNOSIS — Z20828 Contact with and (suspected) exposure to other viral communicable diseases: Secondary | ICD-10-CM | POA: Diagnosis not present

## 2019-10-12 ENCOUNTER — Other Ambulatory Visit: Payer: Self-pay | Admitting: Physician Assistant

## 2019-10-12 DIAGNOSIS — E781 Pure hyperglyceridemia: Secondary | ICD-10-CM

## 2019-10-12 DIAGNOSIS — R3914 Feeling of incomplete bladder emptying: Secondary | ICD-10-CM

## 2019-10-12 DIAGNOSIS — E7800 Pure hypercholesterolemia, unspecified: Secondary | ICD-10-CM

## 2019-10-12 DIAGNOSIS — N401 Enlarged prostate with lower urinary tract symptoms: Secondary | ICD-10-CM

## 2019-10-12 DIAGNOSIS — J418 Mixed simple and mucopurulent chronic bronchitis: Secondary | ICD-10-CM

## 2019-10-12 NOTE — Telephone Encounter (Signed)
Please review Vitamin D3 50,000 IU for refill. Could not pend this medication. Vascepa 1 gm, does not have a refill protocol.

## 2019-11-07 ENCOUNTER — Other Ambulatory Visit: Payer: Self-pay | Admitting: Physician Assistant

## 2019-11-07 DIAGNOSIS — J418 Mixed simple and mucopurulent chronic bronchitis: Secondary | ICD-10-CM

## 2019-11-21 ENCOUNTER — Other Ambulatory Visit: Payer: Self-pay | Admitting: Physician Assistant

## 2019-11-21 DIAGNOSIS — N401 Enlarged prostate with lower urinary tract symptoms: Secondary | ICD-10-CM

## 2019-11-23 DIAGNOSIS — Z20828 Contact with and (suspected) exposure to other viral communicable diseases: Secondary | ICD-10-CM | POA: Diagnosis not present

## 2019-11-23 DIAGNOSIS — J310 Chronic rhinitis: Secondary | ICD-10-CM | POA: Diagnosis not present

## 2019-11-23 DIAGNOSIS — R05 Cough: Secondary | ICD-10-CM | POA: Diagnosis not present

## 2019-12-02 DIAGNOSIS — Z20828 Contact with and (suspected) exposure to other viral communicable diseases: Secondary | ICD-10-CM | POA: Diagnosis not present

## 2019-12-02 DIAGNOSIS — R5383 Other fatigue: Secondary | ICD-10-CM | POA: Diagnosis not present

## 2019-12-02 DIAGNOSIS — R05 Cough: Secondary | ICD-10-CM | POA: Diagnosis not present

## 2019-12-09 DIAGNOSIS — R05 Cough: Secondary | ICD-10-CM | POA: Diagnosis not present

## 2019-12-09 DIAGNOSIS — Z20828 Contact with and (suspected) exposure to other viral communicable diseases: Secondary | ICD-10-CM | POA: Diagnosis not present

## 2019-12-09 DIAGNOSIS — R5383 Other fatigue: Secondary | ICD-10-CM | POA: Diagnosis not present

## 2019-12-30 NOTE — Progress Notes (Signed)
Patient: Joseph Holt. Male    DOB: 1950/11/30   69 y.o.   MRN: [id number] Visit Date: 01/02/2020  Today's Provider: Mar Daring, PA-C   Chief Complaint  Patient presents with  . Follow-up    Paranoid Schizophrenia   Subjective:     HPI  Patient here to follow up Bipolar 1 disorder and Paranoid Schizophrenia. He reports he is doing well. No complaints. Stable on medications. Feels well and is sleeping well. Feels he may be able to move to a facility where he will have his own room in the coming months. Is cutting back smoking and is down to 4-5 cigarettes per day. Has also had both Covid vaccines for living in a group home but did not bring record.   No Known Allergies  Allergies as of 01/02/2020   No Known Allergies     Medication List       Accurate as of January 02, 2020 12:23 PM. If you have any questions, ask your nurse or doctor.        Advair Diskus 250-50 MCG/DOSE Aepb Generic drug: Fluticasone-Salmeterol INHALE 1 PUFF TWICE DAILY.   amantadine 100 MG capsule Commonly known as: SYMMETREL TAKE 1 CAPSULE BY MOUTH TWICE DAILY. **DO NOT CRUSH**   cetirizine 10 MG tablet Commonly known as: ZYRTEC TAKE (1) TABLET BY MOUTH ONCE DAILY.   chlorproMAZINE 25 MG tablet Commonly known as: THORAZINE TAKE 1 TABLET BY MOUTH 3 TIMES DAILY.   cyanocobalamin 1000 MCG tablet Take 1 tablet (1,000 mcg total) by mouth daily.   divalproex 500 MG DR tablet Commonly known as: DEPAKOTE TAKE 1 TABLET BY MOUTH TWICE DAILY. **DO NOT CRUSH**   gabapentin 400 MG capsule Commonly known as: NEURONTIN TAKE 1 TABLET BY MOUTH EVERY MORNING.   LORazepam 0.5 MG tablet Commonly known as: ATIVAN Take 1 tablet (0.5 mg total) by mouth at bedtime.   mirtazapine 30 MG tablet Commonly known as: Remeron Take 1 tablet (30 mg total) by mouth at bedtime.   Namzaric 28-10 MG Cp24 Generic drug: Memantine HCl-Donepezil HCl TAKE 1 CAPSULE BY MOUTH ONCE DAILY. **DO NOT  CRUSH**   omeprazole 20 MG capsule Commonly known as: PRILOSEC TAKE 1 CAPSULE BY MOUTH EACH MORNING. **DO NOT CRUSH**   QUEtiapine 300 MG 24 hr tablet Commonly known as: SEROQUEL XR Take 2 tablets (600 mg total) by mouth at bedtime.   simvastatin 20 MG tablet Commonly known as: ZOCOR TAKE 1 TABLET BY MOUTH AT BEDTIME.   tamsulosin 0.4 MG Caps capsule Commonly known as: FLOMAX TAKE (1) CAPSULE BY MOUTH AT BEDTIME.*DO NOT CRUSH* What changed: See the new instructions. Changed by: Mar Daring, PA-C   Vascepa 1 g capsule Generic drug: icosapent Ethyl TAKE (2) CAPSULES BY MOUTH TWICE DAILY.   Ventolin HFA 108 (90 Base) MCG/ACT inhaler Generic drug: albuterol INHALE 2 PUFFS INTO LUNGS EVERY 6 HOURS AS NEEDED FOR WHEEZING OR SHORTNESS OF BREATH   vitamin C 1000 MG tablet TAKE (1) TABLET BY MOUTH ONCE DAILY.   Vitamin D (Ergocalciferol) 1.25 MG (50000 UNIT) Caps capsule Commonly known as: DRISDOL Take 1 capsule (50,000 Units total) by mouth every 7 (seven) days.   Vitamin D3 1.25 MG (50000 UT) Caps TAKE 1 CAPSULE BY MOUTH ONCE A WEEK.       Review of Systems  Constitutional: Negative.   Respiratory: Negative.   Cardiovascular: Negative.   Gastrointestinal: Negative.   Neurological: Negative.   Psychiatric/Behavioral: Positive for  hallucinations. Negative for agitation and behavioral problems.    Social History   Tobacco Use  . Smoking status: Current Every Day Smoker    Packs/day: 0.20    Types: Cigarettes  . Smokeless tobacco: Never Used  . Tobacco comment: pt smokes 1 cigarette a day  Substance Use Topics  . Alcohol use: No      Objective:   BP 114/71 (BP Location: Left Arm, Patient Position: Sitting, Cuff Size: Normal)   Pulse 94   Temp (!) 97.1 F (36.2 C) (Temporal)   Resp 16   Wt 176 lb 1.6 oz (79.9 kg)   BMI 24.56 kg/m  Vitals:   01/02/20 0951  BP: 114/71  Pulse: 94  Resp: 16  Temp: (!) 97.1 F (36.2 C)  TempSrc: Temporal  Weight:  176 lb 1.6 oz (79.9 kg)  Body mass index is 24.56 kg/m.   Physical Exam Vitals reviewed.  Constitutional:      General: He is not in acute distress.    Appearance: Normal appearance. He is well-developed. He is not ill-appearing or diaphoretic.  HENT:     Head: Normocephalic and atraumatic.  Cardiovascular:     Rate and Rhythm: Normal rate and regular rhythm.     Pulses: Normal pulses.     Heart sounds: Normal heart sounds. No murmur. No friction rub. No gallop.   Pulmonary:     Effort: Pulmonary effort is normal. No respiratory distress.     Breath sounds: Normal breath sounds. No wheezing or rales.  Musculoskeletal:     Cervical back: Normal range of motion and neck supple.  Neurological:     Mental Status: He is alert.      No results found for any visits on 01/02/20.     Assessment & Plan    1. Paranoid schizophrenia ([place]) Stable. Continue medications as prescribed. Will check labs as below and f/u pending results. Return in 6 months for labs.  - CBC w/Diff/Platelet - Comprehensive Metabolic Panel (CMET) - Lipid Panel With LDL/HDL Ratio - Valproic Acid level  2. Social maladjustment Stable. Will check labs as below and f/u pending results. - CBC w/Diff/Platelet - Comprehensive Metabolic Panel (CMET) - Lipid Panel With LDL/HDL Ratio - Valproic Acid level  3. Bipolar 1 disorder (HCC) Stable. Will check labs as below and f/u pending results. - CBC w/Diff/Platelet - Comprehensive Metabolic Panel (CMET) - Lipid Panel With LDL/HDL Ratio - Valproic Acid level  4. Schizo affective schizophrenia ([place]) Stable. Will check labs as below and f/u pending results. - CBC w/Diff/Platelet - Comprehensive Metabolic Panel (CMET) - Lipid Panel With LDL/HDL Ratio - Valproic Acid level  5. Mild protein-calorie malnutrition ([place]) Gaining weight now.  - CBC w/Diff/Platelet - Comprehensive Metabolic Panel (CMET) - Lipid Panel With LDL/HDL Ratio - Valproic Acid level  6.  Mixed simple and mucopurulent chronic bronchitis (HCC) Uses albuterol prn. Does have Advair but does not use BID like prescribed.  - CBC w/Diff/Platelet - Comprehensive Metabolic Panel (CMET) - Lipid Panel With LDL/HDL Ratio - Valproic Acid level     Mar Daring, PA-C  [facility]

## 2020-01-02 ENCOUNTER — Ambulatory Visit (INDEPENDENT_AMBULATORY_CARE_PROVIDER_SITE_OTHER): Payer: Medicare Other | Admitting: Physician Assistant

## 2020-01-02 ENCOUNTER — Other Ambulatory Visit: Payer: Self-pay

## 2020-01-02 ENCOUNTER — Encounter: Payer: Self-pay | Admitting: Physician Assistant

## 2020-01-02 VITALS — BP 114/71 | HR 94 | Temp 97.1°F | Resp 16 | Wt 176.1 lb

## 2020-01-02 DIAGNOSIS — J418 Mixed simple and mucopurulent chronic bronchitis: Secondary | ICD-10-CM

## 2020-01-02 DIAGNOSIS — R3914 Feeling of incomplete bladder emptying: Secondary | ICD-10-CM | POA: Diagnosis not present

## 2020-01-02 DIAGNOSIS — F319 Bipolar disorder, unspecified: Secondary | ICD-10-CM

## 2020-01-02 DIAGNOSIS — N401 Enlarged prostate with lower urinary tract symptoms: Secondary | ICD-10-CM | POA: Diagnosis not present

## 2020-01-02 DIAGNOSIS — F200 Paranoid schizophrenia: Secondary | ICD-10-CM | POA: Diagnosis not present

## 2020-01-02 DIAGNOSIS — E441 Mild protein-calorie malnutrition: Secondary | ICD-10-CM | POA: Diagnosis not present

## 2020-01-02 DIAGNOSIS — F259 Schizoaffective disorder, unspecified: Secondary | ICD-10-CM

## 2020-01-02 DIAGNOSIS — Z609 Problem related to social environment, unspecified: Secondary | ICD-10-CM

## 2020-01-02 MED ORDER — TAMSULOSIN HCL 0.4 MG PO CAPS: ORAL_CAPSULE | ORAL | 11 refills | Status: AC

## 2020-01-02 NOTE — Patient Instructions (Signed)
Health Maintenance After Age 69 After age 69, you are at a higher risk for certain long-term diseases and infections as well as injuries from falls. Falls are a major cause of broken bones and head injuries in people who are older than age 69. Getting regular preventive care can help to keep you healthy and well. Preventive care includes getting regular testing and making lifestyle changes as recommended by your health care provider. Talk with your health care provider about:  Which screenings and tests you should have. A screening is a test that checks for a disease when you have no symptoms.  A diet and exercise plan that is right for you. What should I know about screenings and tests to prevent falls? Screening and testing are the best ways to find a health problem early. Early diagnosis and treatment give you the best chance of managing medical conditions that are common after age 69. Certain conditions and lifestyle choices may make you more likely to have a fall. Your health care provider may recommend:  Regular vision checks. Poor vision and conditions such as cataracts can make you more likely to have a fall. If you wear glasses, make sure to get your prescription updated if your vision changes.  Medicine review. Work with your health care provider to regularly review all of the medicines you are taking, including over-the-counter medicines. Ask your health care provider about any side effects that may make you more likely to have a fall. Tell your health care provider if any medicines that you take make you feel dizzy or sleepy.  Osteoporosis screening. Osteoporosis is a condition that causes the bones to get weaker. This can make the bones weak and cause them to break more easily.  Blood pressure screening. Blood pressure changes and medicines to control blood pressure can make you feel dizzy.  Strength and balance checks. Your health care provider may recommend certain tests to check your  strength and balance while standing, walking, or changing positions.  Foot health exam. Foot pain and numbness, as well as not wearing proper footwear, can make you more likely to have a fall.  Depression screening. You may be more likely to have a fall if you have a fear of falling, feel emotionally low, or feel unable to do activities that you used to do.  Alcohol use screening. Using too much alcohol can affect your balance and may make you more likely to have a fall. What actions can I take to lower my risk of falls? General instructions  Talk with your health care provider about your risks for falling. Tell your health care provider if: ? You fall. Be sure to tell your health care provider about all falls, even ones that seem minor. ? You feel dizzy, sleepy, or off-balance.  Take over-the-counter and prescription medicines only as told by your health care provider. These include any supplements.  Eat a healthy diet and maintain a healthy weight. A healthy diet includes low-fat dairy products, low-fat (lean) meats, and fiber from whole grains, beans, and lots of fruits and vegetables. Home safety  Remove any tripping hazards, such as rugs, cords, and clutter.  Install safety equipment such as grab bars in bathrooms and safety rails on stairs.  Keep rooms and walkways well-lit. Activity   Follow a regular exercise program to stay fit. This will help you maintain your balance. Ask your health care provider what types of exercise are appropriate for you.  If you need a cane or   walker, use it as recommended by your health care provider.  Wear supportive shoes that have nonskid soles. Lifestyle  Do not drink alcohol if your health care provider tells you not to drink.  If you drink alcohol, limit how much you have: ? 0-1 drink a day for women. ? 0-2 drinks a day for men.  Be aware of how much alcohol is in your drink. In the [place] one drink equals one typical bottle of beer (12  oz), one-half glass of wine (5 oz), or one shot of hard liquor (1 oz).  Do not use any products that contain nicotine or tobacco, such as cigarettes and e-cigarettes. If you need help quitting, ask your health care provider. Summary  Having a healthy lifestyle and getting preventive care can help to protect your health and wellness after age 69.  Screening and testing are the best way to find a health problem early and help you avoid having a fall. Early diagnosis and treatment give you the best chance for managing medical conditions that are more common for people who are older than age 69.  Falls are a major cause of broken bones and head injuries in people who are older than age 69. Take precautions to prevent a fall at home.  Work with your health care provider to learn what changes you can make to improve your health and wellness and to prevent falls. This information is not intended to replace advice given to you by your health care provider. Make sure you discuss any questions you have with your health care provider. Document Revised: 01/13/2019 Document Reviewed: 08/05/2017 Elsevier Patient Education  2020 [employer] Inc.  

## 2020-01-03 ENCOUNTER — Telehealth: Payer: Self-pay

## 2020-01-03 LAB — LIPID PANEL WITH LDL/HDL RATIO
Cholesterol, Total: 95 mg/dL — ABNORMAL LOW (ref 100–199)
HDL: 34 mg/dL — ABNORMAL LOW (ref >39)
LDL Chol Calc (NIH): 40 mg/dL (ref 0–99)
LDL/HDL Ratio: 1.2 ratio (ref 0.0–3.6)
Triglycerides: 117 mg/dL (ref 0–149)
VLDL Cholesterol Cal: 21 mg/dL (ref 5–40)

## 2020-01-03 LAB — COMPREHENSIVE METABOLIC PANEL
ALT: 15 IU/L (ref 0–44)
AST: 18 IU/L (ref 0–40)
Albumin/Globulin Ratio: 1.8 (ref 1.2–2.2)
Albumin: 4.5 g/dL (ref 3.8–4.8)
Alkaline Phosphatase: 116 IU/L (ref 39–117)
BUN/Creatinine Ratio: 12 (ref 10–24)
BUN: 13 mg/dL (ref 8–27)
Bilirubin Total: 0.3 mg/dL (ref 0.0–1.2)
CO2: 24 mmol/L (ref 20–29)
Calcium: 9.3 mg/dL (ref 8.6–10.2)
Chloride: 104 mmol/L (ref 96–106)
Creatinine, Ser: 1.1 mg/dL (ref 0.76–1.27)
GFR calc Af Amer: 79 mL/min/{1.73_m2} (ref >59)
GFR calc non Af Amer: 68 mL/min/{1.73_m2} (ref >59)
Globulin, Total: 2.5 g/dL (ref 1.5–4.5)
Glucose: 124 mg/dL — ABNORMAL HIGH (ref 65–99)
Potassium: 4.3 mmol/L (ref 3.5–5.2)
Sodium: 143 mmol/L (ref 134–144)
Total Protein: 7 g/dL (ref 6.0–8.5)

## 2020-01-03 LAB — CBC WITH DIFFERENTIAL/PLATELET
Basophils Absolute: 0 10*3/uL (ref 0.0–0.2)
Basos: 1 %
EOS (ABSOLUTE): 0.2 10*3/uL (ref 0.0–0.4)
Eos: 3 %
Hematocrit: 42.4 % (ref 37.5–51.0)
Hemoglobin: 14.2 g/dL (ref 13.0–17.7)
Immature Grans (Abs): 0.1 10*3/uL (ref 0.0–0.1)
Immature Granulocytes: 1 %
Lymphocytes Absolute: 1.1 10*3/uL (ref 0.7–3.1)
Lymphs: 20 %
MCH: 31.8 pg (ref 26.6–33.0)
MCHC: 33.5 g/dL (ref 31.5–35.7)
MCV: 95 fL (ref 79–97)
Monocytes Absolute: 0.6 10*3/uL (ref 0.1–0.9)
Monocytes: 10 %
Neutrophils Absolute: 3.7 10*3/uL (ref 1.4–7.0)
Neutrophils: 65 %
Platelets: 171 10*3/uL (ref 150–450)
RBC: 4.47 x10E6/uL (ref 4.14–5.80)
RDW: 12.9 % (ref 11.6–15.4)
WBC: 5.6 10*3/uL (ref 3.4–10.8)

## 2020-01-03 LAB — VALPROIC ACID LEVEL: Valproic Acid Lvl: 69 ug/mL (ref 50–100)

## 2020-01-03 NOTE — Telephone Encounter (Signed)
LMTCB 01/03/2020. [facility] please advise pt as below.   Thanks,   -Mickel Baas

## 2020-01-03 NOTE — Telephone Encounter (Deleted)
Pt advised.   Thanks,   -Ronold Hardgrove  

## 2020-01-03 NOTE — Telephone Encounter (Signed)
-----   Message from Mar Daring, [place] sent at 01/03/2020 12:10 PM EDT ----- All labs are within normal limits and stable.  Thanks! -JB

## 2020-01-03 NOTE — Telephone Encounter (Signed)
Attempted to call pt regarding lab results message. No answer, left message to return out call.

## 2020-01-20 DIAGNOSIS — Z20828 Contact with and (suspected) exposure to other viral communicable diseases: Secondary | ICD-10-CM | POA: Diagnosis not present

## 2020-02-23 DIAGNOSIS — R110 Nausea: Secondary | ICD-10-CM | POA: Diagnosis not present

## 2020-02-23 DIAGNOSIS — Z20828 Contact with and (suspected) exposure to other viral communicable diseases: Secondary | ICD-10-CM | POA: Diagnosis not present

## 2020-03-02 DIAGNOSIS — E7800 Pure hypercholesterolemia, unspecified: Secondary | ICD-10-CM | POA: Diagnosis not present

## 2020-03-02 DIAGNOSIS — F200 Paranoid schizophrenia: Secondary | ICD-10-CM | POA: Diagnosis not present

## 2020-03-02 DIAGNOSIS — Z125 Encounter for screening for malignant neoplasm of prostate: Secondary | ICD-10-CM | POA: Diagnosis not present

## 2020-03-02 DIAGNOSIS — R739 Hyperglycemia, unspecified: Secondary | ICD-10-CM | POA: Diagnosis not present

## 2020-03-02 DIAGNOSIS — Z20828 Contact with and (suspected) exposure to other viral communicable diseases: Secondary | ICD-10-CM | POA: Diagnosis not present

## 2020-03-02 DIAGNOSIS — F4310 Post-traumatic stress disorder, unspecified: Secondary | ICD-10-CM | POA: Diagnosis not present

## 2020-04-13 DIAGNOSIS — R4189 Other symptoms and signs involving cognitive functions and awareness: Secondary | ICD-10-CM | POA: Diagnosis not present

## 2020-04-13 DIAGNOSIS — F200 Paranoid schizophrenia: Secondary | ICD-10-CM | POA: Diagnosis not present

## 2020-04-27 ENCOUNTER — Other Ambulatory Visit: Payer: Self-pay | Admitting: Physician Assistant

## 2020-04-27 DIAGNOSIS — E7800 Pure hypercholesterolemia, unspecified: Secondary | ICD-10-CM

## 2020-04-27 DIAGNOSIS — F02818 Dementia in other diseases classified elsewhere, unspecified severity, with other behavioral disturbance: Secondary | ICD-10-CM

## 2020-04-27 DIAGNOSIS — E781 Pure hyperglyceridemia: Secondary | ICD-10-CM

## 2020-04-27 DIAGNOSIS — R251 Tremor, unspecified: Secondary | ICD-10-CM

## 2020-04-27 DIAGNOSIS — J418 Mixed simple and mucopurulent chronic bronchitis: Secondary | ICD-10-CM

## 2020-04-27 DIAGNOSIS — L509 Urticaria, unspecified: Secondary | ICD-10-CM

## 2020-04-27 DIAGNOSIS — K219 Gastro-esophageal reflux disease without esophagitis: Secondary | ICD-10-CM

## 2020-04-27 DIAGNOSIS — F0281 Dementia in other diseases classified elsewhere with behavioral disturbance: Secondary | ICD-10-CM

## 2020-04-27 NOTE — Telephone Encounter (Signed)
Requested Prescriptions  Pending Prescriptions Disp Refills   omeprazole (PRILOSEC) 20 MG capsule [Pharmacy Med Name: Omeprazole 20 MG Capsule delayed release] 30 capsule 3    Sig: TAKE ONE CAPSULE BY MOUTH EVERY MORNING *DO NOT CRUSH*     Gastroenterology: Proton Pump Inhibitors Passed - 04/27/2020 12:05 PM      Passed - Valid encounter within last 12 months    Recent Outpatient Visits          3 months ago Paranoid schizophrenia [facility])   [facility] Welsh, [place], PA-C   9 months ago Paranoid schizophrenia [facility])   [place], [place], PA-C   1 year ago Paranoid schizophrenia [facility])   [place], [place], PA-C   1 year ago Bipolar 1 disorder [facility])   [place], [place], PA-C   1 year ago Paranoid schizophrenia [facility])   [place], Clearnce Sorrel, PA-C      Future Appointments            In 2 months Burnette, Clearnce Sorrel, PA-C [employer] [employer], [facility]            simvastatin (ZOCOR) 20 MG tablet [employer] Med Name: Simvastatin 20 MG Tablet] 30 tablet 3    Sig: TAKE ONE TABLET BY MOUTH AT BEDTIME     Cardiovascular:  Antilipid - Statins Failed - 04/27/2020 12:05 PM      Failed - Total Cholesterol in normal range and within 360 days    Cholesterol, Total  Date Value Ref Range Status  01/02/2020 95 (L) 100 - 199 mg/dL Final         Failed - LDL in normal range and within 360 days    LDL Chol Calc (NIH)  Date Value Ref Range Status  01/02/2020 40 0 - 99 mg/dL Final         Failed - HDL in normal range and within 360 days    HDL  Date Value Ref Range Status  01/02/2020 34 (L) >39 mg/dL Final         Passed - Triglycerides in normal range and within 360 days    Triglycerides  Date Value Ref Range Status  01/02/2020 117 0 - 149 mg/dL Final         Passed - Patient is not pregnant      Passed - Valid encounter within last 12 months     Recent Outpatient Visits          3 months ago Paranoid schizophrenia [facility])   [place], [place], PA-C   9 months ago Paranoid schizophrenia [facility])   [place], Clearnce Sorrel, PA-C   1 year ago Paranoid schizophrenia [facility])   [place], Clearnce Sorrel, PA-C   1 year ago Bipolar 1 disorder [facility])   [place], Clearnce Sorrel, [place]   1 year ago Paranoid schizophrenia [facility])   [place], Clearnce Sorrel, [place]      Future Appointments            In 2 months Burnette, Clearnce Sorrel, PA-C [place], [place] 28-10 MG CP24 [Pharmacy Med Name: Namzaric 28-10 MG Capsule extended release 24 hr] 30 capsule 3    Sig: TAKE ONE CAPSULE ONCE A DAY. * DO NOT CRUSH *     Neurology:  Alzheimer's Agents Passed -  04/27/2020 12:05 PM      Passed - Valid encounter within last 6 months    Recent Outpatient Visits          3 months ago Paranoid schizophrenia [place])   [place], [place], [place]   9 months ago Paranoid schizophrenia [facility])   [place], Clearnce Sorrel, PA-C   1 year ago Paranoid schizophrenia [facility])   [place], Clearnce Sorrel, PA-C   1 year ago Bipolar 1 disorder [facility])   [facility] [place], Clearnce Sorrel, PA-C   1 year ago Paranoid schizophrenia [facility])   [place], PA-C      Future Appointments            In 2 months Burnette, Clearnce Sorrel, PA-C [employer], [facility]            Ascorbic Acid (VITAMIN C) 1000 MG tablet [Pharmacy Med Name: Vitamin C 1000 MG Tablet] 30 tablet 3    Sig: [place]     Endocrinology:  Vitamins Passed - 04/27/2020 12:05 PM      Passed - Valid encounter within last 12 months    Recent Outpatient Visits          3 months ago Paranoid [facility])    [facility] [place], [place], PA-C   9 months ago Paranoid schizophrenia [facility])   [place], Clearnce Sorrel, PA-C   1 year ago Paranoid schizophrenia [facility])   [place], Clearnce Sorrel, PA-C   1 year ago Bipolar 1 disorder [facility])   [place], Clearnce Sorrel, PA-C   1 year ago Paranoid schizophrenia [facility])   [place], Clearnce Sorrel, PA-C      Future Appointments            In 2 months Burnette, Clearnce Sorrel, PA-C [employer], [facility]            VASCEPA 1 g capsule [employer] Med Name: Vascepa 1 GM Capsule] 120 capsule 3    Sig: TAKE 2 CAPSULES BY MOUTH TWO TIMES A DAY     Off-Protocol Failed - 04/27/2020 12:05 PM      Failed - Medication not assigned to a protocol, review manually.      Passed - Valid encounter within last 12 months    Recent Outpatient Visits          3 months ago Paranoid schizophrenia [facility])   [place], [place], [place]   9 months ago Paranoid schizophrenia [facility])   [place], Clearnce Sorrel, PA-C   1 year ago Paranoid schizophrenia [facility])   [place], Clearnce Sorrel, PA-C   1 year ago Bipolar 1 disorder [facility])   [place], Clearnce Sorrel, PA-C   1 year ago Paranoid schizophrenia [facility])   [place], PA-C      Future Appointments            In 2 months Burnette, Clearnce Sorrel, PA-C [employer], [facility]            VENTOLIN HFA 108 (90 Base) MCG/ACT inhaler [Pharmacy Med Name: Ventolin HFA 108 (90 Base) MCG/ACT Aerosol, solution] 18 g 3    Sig: INHALE 2 PUFFS INTO LUNGS EVERY 6 HOURS AS NEEDED FOR WHEEZING OR SHORT OF BREATH     Pulmonology:  Beta Agonists Failed - 04/27/2020 12:05 PM      Failed - One inhaler should last at least one month. If the patient is requesting refills earlier, contact the  patient to check for uncontrolled symptoms.      Passed - Valid encounter within last 12 months    Recent Outpatient Visits          3 months ago Paranoid schizophrenia [facility])   [place], [place], [place]   9 months ago Paranoid schizophrenia [facility])   [place], Clearnce Sorrel, [place]   1 year ago Paranoid schizophrenia [facility])   [place], Clearnce Sorrel, PA-C   1 year ago Bipolar 1 disorder [facility])   [place], Clearnce Sorrel, PA-C   1 year ago Paranoid schizophrenia [facility])   [place], Clearnce Sorrel, PA-C      Future Appointments            In 2 months Burnette, Clearnce Sorrel, PA-C [employer], [place]

## 2020-04-27 NOTE — Telephone Encounter (Signed)
Requested medication (s) are due for refill today -unsure  Requested medication (s) are on the active medication list -yes  Future visit scheduled -yes  Last refill: 8 months ago- 07/15/19  Notes to clinic: Request for RF of medication not assigned protocol  Requested Prescriptions  Pending Prescriptions Disp Refills   amantadine (SYMMETREL) 100 MG capsule [Pharmacy Med Name: Amantadine HCl 100 MG Capsule] 31 capsule 10    Sig: TAKE ONE CAPSULE TWICE A DAY; (DO NOT CRUSH)      Off-Protocol Failed - 04/27/2020 11:59 AM      Failed - Medication not assigned to a protocol, review manually.      Passed - Valid encounter within last 12 months    Recent Outpatient Visits           3 months ago Paranoid schizophrenia [facility])   [place], Anderson Malta M, [place]   9 months ago [facility])   [place], Clearnce Sorrel, PA-C   1 year ago Paranoid schizophrenia [facility])   [place], Clearnce Sorrel, PA-C   1 year ago Bipolar 1 disorder [facility])   [place], Clearnce Sorrel, PA-C   1 year ago Paranoid schizophrenia [facility])   [place], Clearnce Sorrel, PA-C       Future Appointments             In 2 months Burnette, Clearnce Sorrel, PA-C [employer], [facility]             Signed Prescriptions Disp Refills   cetirizine (ZYRTEC) 10 MG tablet 30 tablet 2    Sig: TAKE ONE TABLET BY MOUTH EVERY DAY      Ear, Nose, and Throat:  Antihistamines Passed - 04/27/2020 11:59 AM      Passed - Valid encounter within last 12 months    Recent Outpatient Visits           3 months ago Paranoid schizophrenia [facility])   [facility] [place], [place]in, PA-C   9 months ago Paranoid schizophrenia [facility])   [place], Clearnce Sorrel, PA-C   1 year ago Paranoid schizophrenia [facility])   [place], Clearnce Sorrel, PA-C   1 year ago  Bipolar 1 disorder [facility])   [facility] [place], Clearnce Sorrel, PA-C   1 year ago Paranoid schizophrenia [facility])   [place], Clearnce Sorrel, PA-C       Future Appointments             In 2 months Burnette, Clearnce Sorrel, PA-C [employer], [facility]                Requested Prescriptions  Pending Prescriptions Disp Refills   amantadine (SYMMETREL) 100 MG capsule [Pharmacy Med Name: Amantadine HCl 100 MG Capsule] 31 capsule 10    Sig: TAKE ONE CAPSULE TWICE A DAY; (DO NOT CRUSH)      Off-Protocol Failed - 04/27/2020 11:59 AM      Failed - Medication not assigned to a protocol, review manually.      Passed - Valid encounter within last 12 months    Recent Outpatient Visits           3 months ago Paranoid schizophrenia [facility])   [place], [place], [place]   9 months ago Paranoid schizophrenia [facility])   [place], [place], [place]   1 year ago Paranoid schizophrenia ([place])  [facility] [place], Clearnce Sorrel, PA-C   1 year ago Bipolar 1 disorder [facility])   [place], Clearnce Sorrel, [place]   1 year ago Paranoid schizophrenia [facility])   [place], Clearnce Sorrel, [place]       Future Appointments             In 2 months Burnette, Clearnce Sorrel, PA-C [employer], [facility]             Signed Prescriptions Disp Refills   cetirizine (ZYRTEC) 10 MG tablet 30 tablet 2    Sig: TAKE ONE TABLET BY MOUTH EVERY DAY      Ear, Nose, and Throat:  Antihistamines Passed - 04/27/2020 11:59 AM      Passed - Valid encounter within last 12 months    Recent Outpatient Visits           3 months ago Paranoid schizophrenia [facility])   [facility] [place], [place], [place]   9 months ago Paranoid schizophrenia [facility])   [place], Clearnce Sorrel, [place]   1 year ago Paranoid schizophrenia [facility])   [place], Clearnce Sorrel, [place]   1 year ago Bipolar 1 disorder [facility])   [place], Clearnce Sorrel, PA-C   1 year ago Paranoid schizophrenia [facility])   [place], Clearnce Sorrel, [place]       Future Appointments             In 2 months Burnette, Clearnce Sorrel, PA-C [employer], [place]

## 2020-04-30 ENCOUNTER — Other Ambulatory Visit: Payer: Self-pay | Admitting: Physician Assistant

## 2020-04-30 DIAGNOSIS — N401 Enlarged prostate with lower urinary tract symptoms: Secondary | ICD-10-CM

## 2020-05-02 ENCOUNTER — Other Ambulatory Visit: Payer: Self-pay | Admitting: Physician Assistant

## 2020-05-02 DIAGNOSIS — F200 Paranoid schizophrenia: Secondary | ICD-10-CM

## 2020-05-02 MED ORDER — MIRTAZAPINE 30 MG PO TABS: 30.0000 mg | ORAL_TABLET | Freq: Every day | ORAL | 1 refills | Status: DC

## 2020-05-02 NOTE — Telephone Encounter (Signed)
PT need a refill  mirtazapine (REMERON) 30 MG tablet [[id number]]  [place]  [address] [place] [address]  Phone: [phone] Fax: [phone]

## 2020-05-18 ENCOUNTER — Ambulatory Visit (INDEPENDENT_AMBULATORY_CARE_PROVIDER_SITE_OTHER): Payer: Medicare Other | Admitting: Physician Assistant

## 2020-05-18 DIAGNOSIS — M545 Low back pain, unspecified: Secondary | ICD-10-CM

## 2020-05-18 MED ORDER — MELOXICAM 15 MG PO TABS: 15.0000 mg | ORAL_TABLET | Freq: Every day | ORAL | 0 refills | Status: DC

## 2020-05-18 NOTE — Progress Notes (Addendum)
Virtual telephone visit    Virtual Visit via Telephone Note   This visit type was conducted due to national recommendations for restrictions regarding the COVID-19 Pandemic (e.g. social distancing) in an effort to limit this patient's exposure and mitigate transmission in our community. Due to his co-morbid illnesses, this patient is at least at moderate risk for complications without adequate follow up. This format is felt to be most appropriate for this patient at this time. The patient did not have access to video technology or had technical difficulties with video requiring transitioning to audio format only (telephone). Physical exam was limited to content and character of the telephone converstion.   Visit was conducted with the group home worker, Milagros Reap, due to patient's cognitive disabilities.   I connected with [name]. on 05/18/20 at  4:00 PM EDT by telephone and verified that I am speaking with the correct person using two identifiers.  I discussed the limitations of evaluation and management by telemedicine and the availability of in person appointments. The patient expressed understanding and agreed to proceed.  Patient location: Group Home Provider location: [facility]   Visit Date: 05/18/2020  Today's healthcare provider: Mar Daring, PA-C   Chief Complaint  Patient presents with  . Back Pain  . Cough   I,Latasha Walston,acting as a scribe for [employer], PA-C.,have documented all relevant documentation on the behalf of Mar Daring, PA-C,as directed by  Mar Daring, PA-C while in the presence of Mar Daring, [place].  Subjective    HPI  Izora Gala caregiver with [facility] reports patient is C/O lower back pain that started Tuesday. She states patient has limited movement due to the pain. He took two aspirin with mild relief. He has been sitting in a chair and not moving around much due to his back pain. He denies the pain  radiates down his legs. States it is in the middle of his low back.    Patient Active Problem List   Diagnosis Date Noted  . Mild protein-calorie malnutrition ([place]) 12/06/2018  . Mixed simple and mucopurulent chronic bronchitis ([place]) 12/06/2018  . Sleep disturbance 09/06/2018  . Bipolar 1 disorder ([place]) 04/14/2018  . Schizo affective schizophrenia ([place]) 04/14/2018  . Cognitive impairment 01/06/2018  . Tobacco use disorder 01/02/2018  . Paranoid schizophrenia ([place]) 02/21/2015  . Social maladjustment 02/21/2015   Past Medical History:  Diagnosis Date  . Bipolar 1 disorder ([place])   . Schizo affective schizophrenia ([place])       Medications: Outpatient Medications Prior to Visit  Medication Sig  . ADVAIR DISKUS 250-50 MCG/DOSE AEPB INHALE 1 PUFF TWICE DAILY. (Patient not taking: Reported on 01/02/2020)  . amantadine (SYMMETREL) 100 MG capsule TAKE ONE CAPSULE TWICE A DAY; (DO NOT CRUSH)  . Ascorbic Acid (VITAMIN C) 1000 MG tablet TAKE ONE TABLET BY MOUTH EVERY DAY  . cetirizine (ZYRTEC) 10 MG tablet TAKE ONE TABLET BY MOUTH EVERY DAY  . chlorproMAZINE (THORAZINE) 25 MG tablet TAKE 1 TABLET BY MOUTH 3 TIMES DAILY.  Marland Kitchen Cholecalciferol (VITAMIN D3) 1.25 MG (50000 UT) CAPS TAKE 1 CAPSULE BY MOUTH ONCE A WEEK.  . cyanocobalamin 1000 MCG tablet Take 1 tablet (1,000 mcg total) by mouth daily.  . divalproex (DEPAKOTE) 500 MG DR tablet TAKE 1 TABLET BY MOUTH TWICE DAILY. **DO NOT CRUSH**  . gabapentin (NEURONTIN) 400 MG capsule TAKE 1 TABLET BY MOUTH EVERY MORNING.  . LORazepam (ATIVAN) 0.5 MG tablet Take 1 tablet (0.5  mg total) by mouth at bedtime.  . mirtazapine (REMERON) 30 MG tablet Take 1 tablet (30 mg total) by mouth at bedtime.  Marland Kitchen NAMZARIC 28-10 MG CP24 TAKE ONE CAPSULE ONCE A DAY. * DO NOT CRUSH *  . omeprazole (PRILOSEC) 20 MG capsule TAKE ONE CAPSULE BY MOUTH EVERY MORNING *DO NOT CRUSH*  . QUEtiapine (SEROQUEL XR) 300 MG 24 hr tablet Take 2 tablets (600 mg total) by mouth at bedtime.    . simvastatin (ZOCOR) 20 MG tablet TAKE ONE TABLET BY MOUTH AT BEDTIME  . tamsulosin (FLOMAX) 0.4 MG CAPS capsule TAKE (1) CAPSULE BY MOUTH AT BEDTIME.*DO NOT CRUSH*  . VASCEPA 1 g capsule TAKE 2 CAPSULES BY MOUTH TWO TIMES A DAY  . VENTOLIN HFA 108 (90 Base) MCG/ACT inhaler INHALE 2 PUFFS INTO LUNGS EVERY 6 HOURS AS NEEDED FOR WHEEZING OR SHORT OF BREATH  . Vitamin D, Ergocalciferol, (DRISDOL) 50000 units CAPS capsule Take 1 capsule (50,000 Units total) by mouth every 7 (seven) days.   No facility-administered medications prior to visit.    Review of Systems  Constitutional: Negative.   Respiratory: Negative.   Cardiovascular: Negative.   Musculoskeletal: Positive for back pain.    Last CBC Lab Results  Component Value Date   WBC 5.6 01/02/2020   HGB 14.2 01/02/2020   HCT 42.4 01/02/2020   MCV 95 01/02/2020   MCH 31.8 01/02/2020   RDW 12.9 01/02/2020   PLT 171 20/94/7096   Last metabolic panel Lab Results  Component Value Date   GLUCOSE 124 (H) 01/02/2020   NA 143 01/02/2020   K 4.3 01/02/2020   CL 104 01/02/2020   CO2 24 01/02/2020   BUN 13 01/02/2020   CREATININE 1.10 01/02/2020   GFRNONAA 68 01/02/2020   GFRAA 79 01/02/2020   CALCIUM 9.3 01/02/2020   PROT 7.0 01/02/2020   ALBUMIN 4.5 01/02/2020   LABGLOB 2.5 01/02/2020   AGRATIO 1.8 01/02/2020   BILITOT 0.3 01/02/2020   ALKPHOS 116 01/02/2020   AST 18 01/02/2020   ALT 15 01/02/2020   ANIONGAP 9 05/21/2018      Objective    There were no vitals taken for this visit. BP Readings from Last 3 Encounters:  01/02/20 114/71  07/25/19 130/84  12/06/18 100/60   Wt Readings from Last 3 Encounters:  01/02/20 176 lb 1.6 oz (79.9 kg)  07/25/19 179 lb 6.4 oz (81.4 kg)  12/06/18 163 lb (73.9 kg)        Assessment & Plan     1. Acute midline low back pain without sciatica Advised to use heating pad to area. Light stretches. Will try Meloxicam as below for pain and inflammation. Call if worsening or not  improving.  - meloxicam (MOBIC) 15 MG tablet; Take 1 tablet (15 mg total) by mouth daily.  Dispense: 30 tablet; Refill: 0   No follow-ups on file.    I discussed the assessment and treatment plan with the patient. The patient was provided an opportunity to ask questions and all were answered. The patient agreed with the plan and demonstrated an understanding of the instructions.   The patient was advised to call back or seek an in-person evaluation if the symptoms worsen or if the condition fails to improve as anticipated.  I provided 12 minutes of non-face-to-face time during this encounter.  Reynolds Bowl, PA-C, have reviewed all documentation for this visit. The documentation on 05/28/20 for the exam, diagnosis, procedures, and orders are all accurate and complete.  [place] [facility] [phone] (phone) [phone] (fax)  [place]

## 2020-05-28 ENCOUNTER — Encounter: Payer: Self-pay | Admitting: Physician Assistant

## 2020-06-16 ENCOUNTER — Other Ambulatory Visit: Payer: Self-pay | Admitting: Physician Assistant

## 2020-06-16 DIAGNOSIS — M545 Low back pain, unspecified: Secondary | ICD-10-CM

## 2020-06-16 NOTE — Telephone Encounter (Signed)
Requested Prescriptions  Pending Prescriptions Disp Refills   meloxicam (MOBIC) 15 MG tablet [Pharmacy Med Name: MELOXICAM 15 MG TABLET] 30 tablet 2    Sig: TAKE (1) TABLET BY MOUTH ONCE DAILY.     Analgesics:  COX2 Inhibitors Passed - 06/16/2020  9:27 AM      Passed - HGB in normal range and within 360 days    Hemoglobin  Date Value Ref Range Status  01/02/2020 14.2 13.0 - 17.7 g/dL Final         Passed - Cr in normal range and within 360 days    Creatinine  Date Value Ref Range Status  09/09/2014 1.16 0.60 - 1.30 mg/dL Final   Creatinine, Ser  Date Value Ref Range Status  01/02/2020 1.10 0.76 - 1.27 mg/dL Final         Passed - Patient is not pregnant      Passed - Valid encounter within last 12 months    Recent Outpatient Visits          4 weeks ago Acute midline low back pain without sciatica   [facility] [place], [place], PA-C   5 months ago Paranoid schizophrenia [facility])   [place], [place], [place]   10 months ago Paranoid schizophrenia [facility])   [place], Clearnce Sorrel, [place]   1 year ago Paranoid schizophrenia [facility])   [place], Clearnce Sorrel, [place]   1 year ago Bipolar 1 disorder [facility])   [place], Clearnce Sorrel, [place]      Future Appointments            In 3 weeks Marlyn Corporal, Clearnce Sorrel, PA-C [employer], [facility]           '

## 2020-07-09 ENCOUNTER — Ambulatory Visit: Payer: Medicare Other | Admitting: Physician Assistant

## 2020-07-18 ENCOUNTER — Other Ambulatory Visit: Payer: Self-pay | Admitting: Physician Assistant

## 2020-07-18 DIAGNOSIS — F200 Paranoid schizophrenia: Secondary | ICD-10-CM

## 2020-07-18 NOTE — Telephone Encounter (Signed)
Requested medication (s) are due for refill today -no  Requested medication (s) are on the active medication list -yes  Future visit scheduled -no  Last refill: 07/14/20  Notes to clinic: Request RF non delegated Rx  Requested Prescriptions  Pending Prescriptions Disp Refills   LORazepam (ATIVAN) 0.5 MG tablet [Pharmacy Med Name: LORAZEPAM 0.5 MG TABLET] 30 tablet 11    Sig: TAKE 1 TABLET BY MOUTH AT BEDTIME.      Not Delegated - Psychiatry:  Anxiolytics/Hypnotics Failed - 07/18/2020  9:41 AM      Failed - This refill cannot be delegated      Failed - Urine Drug Screen completed in last 360 days.      Failed - Valid encounter within last 6 months    Recent Outpatient Visits           2 months ago Acute midline low back pain without sciatica   [facility] [place], Anderson Malta M, [place]   6 months ago [facility]an[facility])   [facility] Fenton Malling M, [place]   11 months ago Paranoid schizophrenia [facility])   [place], Clearnce Sorrel, PA-C   1 year ago Paranoid schizophrenia [facility])   [place], Clearnce Sorrel, PA-C   1 year ago Bipolar 1 disorder [facility])   [place], Anderson Malta M, [place]                  Requested Prescriptions  Pending Prescriptions Disp Refills   LORazepam (ATIVAN) 0.5 MG tablet [Pharmacy Med Name: LORAZEPAM 0.5 MG TABLET] 30 tablet 11    Sig: TAKE 1 TABLET BY MOUTH AT BEDTIME.      Not Delegated - Psychiatry:  Anxiolytics/Hypnotics Failed - 07/18/2020  9:41 AM      Failed - This refill cannot be delegated      Failed - Urine Drug Screen completed in last 360 days.      Failed - Valid encounter within last 6 months    Recent Outpatient Visits           2 months ago Acute midline low back pain without sciatica   [facility] [place], Clearnce Sorrel, [place]   6 months ago Paranoid schizophrenia [facility])   [facility]  [place] Malling M, [place]   11 months ago Paranoid schizophrenia [facility])   [place], Clearnce Sorrel, [place]   1 year ago Paranoid schizophrenia [facility])   [place], Clearnce Sorrel, PA-C   1 year ago Bipolar 1 disorder [facility])   [facility] [place], [place], [place]

## 2020-07-30 DIAGNOSIS — J3489 Other specified disorders of nose and nasal sinuses: Secondary | ICD-10-CM | POA: Diagnosis not present

## 2020-07-30 DIAGNOSIS — Z20828 Contact with and (suspected) exposure to other viral communicable diseases: Secondary | ICD-10-CM | POA: Diagnosis not present

## 2020-07-30 DIAGNOSIS — J029 Acute pharyngitis, unspecified: Secondary | ICD-10-CM | POA: Diagnosis not present

## 2020-08-13 ENCOUNTER — Other Ambulatory Visit: Payer: Self-pay | Admitting: Physician Assistant

## 2020-08-13 DIAGNOSIS — M545 Low back pain, unspecified: Secondary | ICD-10-CM

## 2020-08-13 DIAGNOSIS — F200 Paranoid schizophrenia: Secondary | ICD-10-CM

## 2020-08-13 DIAGNOSIS — F259 Schizoaffective disorder, unspecified: Secondary | ICD-10-CM

## 2020-08-13 DIAGNOSIS — F319 Bipolar disorder, unspecified: Secondary | ICD-10-CM

## 2020-08-13 DIAGNOSIS — R251 Tremor, unspecified: Secondary | ICD-10-CM

## 2020-08-13 DIAGNOSIS — L509 Urticaria, unspecified: Secondary | ICD-10-CM

## 2020-08-13 DIAGNOSIS — E781 Pure hyperglyceridemia: Secondary | ICD-10-CM

## 2020-08-13 DIAGNOSIS — R4189 Other symptoms and signs involving cognitive functions and awareness: Secondary | ICD-10-CM

## 2020-08-13 DIAGNOSIS — E7800 Pure hypercholesterolemia, unspecified: Secondary | ICD-10-CM

## 2020-08-13 NOTE — Telephone Encounter (Signed)
Requested medication (s) are due for refill today: Yes  Requested medication (s) are on the active medication list: Yes  Last refill:  3 months, 1 year  Future visit scheduled: No  Notes to clinic:  Unable to refill per protocol, one expired Rx, one not assigned to protocol, one cannot delegate     Requested Prescriptions  Pending Prescriptions Disp Refills   amantadine (SYMMETREL) 100 MG capsule [Pharmacy Med Name: AMANTADINE 100 MG CAPSULE] 60 capsule 11    Sig: TAKE 1 CAPSULE BY MOUTH TWICE DAILY. **DO NOT CRUSH**      Off-Protocol Failed - 08/13/2020  5:37 PM      Failed - Medication not assigned to a protocol, review manually.      Passed - Valid encounter within last 12 months    Recent Outpatient Visits           2 months ago Acute midline low back pain without sciatica   [place], [place], [place]   7 months ago Paranoid schizophrenia [facility])   [place], Clearnce Sorrel, [place]   1 year ago Paranoid schizophrenia [facility])   [place], Clearnce Sorrel, PA-C   1 year ago Paranoid schizophrenia [facility])   [place], Clearnce Sorrel, PA-C   1 year ago Bipolar 1 disorder [facility])   [place], Anderson Malta M, PA-C                chlorproMAZINE (THORAZINE) 25 MG tablet [Pharmacy Med Name: CHLORPROMAZINE 25 MG TABLET] 90 tablet 11    Sig: TAKE 1 TABLET BY MOUTH 3 TIMES DAILY.      Not Delegated - Psychiatry:  Antipsychotics - First Generation (Typical) Failed - 08/13/2020  5:37 PM      Failed - This refill cannot be delegated      Failed - Valid encounter within last 6 months    Recent Outpatient Visits           2 months ago Acute midline low back pain without sciatica   [facility] [place], Anderson Malta M, PA-C   7 months ago Paranoid schizophrenia [facility])   [place], [place], [place]   1 year ago Paranoid schizophrenia  [facility])   [place], [place], PA-C   1 year ago Paranoid schizophrenia [facility])   [place], [place], [place]   1 year ago Bipolar 1 disorder [facility])   [facility] Fenton Malling M, [place]              Passed - Cr in normal range and within 360 days    Creatinine  Date Value Ref Range Status  09/09/2014 1.16 0.60 - 1.30 mg/dL Final   Creatinine, Ser  Date Value Ref Range Status  01/02/2020 1.10 0.76 - 1.27 mg/dL Final          Passed - AST in normal range and within 360 days    AST  Date Value Ref Range Status  01/02/2020 18 0 - 40 IU/L Final   SGOT(AST)  Date Value Ref Range Status  09/09/2014 38 (H) 15 - 37 Unit/L Final          Passed - ALT in normal range and within 360 days    ALT  Date Value Ref Range Status  01/02/2020 15 0 - 44 IU/L Final   SGPT (ALT)  Date Value Ref Range Status  09/09/2014 38 U/L Final  Comment:    14-63 NOTE: New Reference Range 04/25/14             QUEtiapine (SEROQUEL XR) 300 MG 24 hr tablet [Pharmacy Med Name: QUETIAPINE *ER* 300MG  TAB] 60 tablet 11    Sig: TAKE 2 TABLETS BY MOUTH AT BEDTIME.      Not Delegated - Psychiatry:  Antipsychotics - Second Generation (Atypical) - quetiapine Failed - 08/13/2020  5:37 PM      Failed - This refill cannot be delegated      Failed - ALT in normal range and within 180 days    ALT  Date Value Ref Range Status  01/02/2020 15 0 - 44 IU/L Final   SGPT (ALT)  Date Value Ref Range Status  09/09/2014 38 U/L Final    Comment:    14-63 NOTE: New Reference Range 04/25/14           Failed - AST in normal range and within 180 days    AST  Date Value Ref Range Status  01/02/2020 18 0 - 40 IU/L Final   SGOT(AST)  Date Value Ref Range Status  09/09/2014 38 (H) 15 - 37 Unit/L Final          Failed - Valid encounter within last 6 months    Recent Outpatient Visits           2 months ago Acute midline low back  pain without sciatica   [facility] [place], Anderson Malta M, PA-C   7 months ago Paranoid schizophrenia [facility])   [place], Clearnce Sorrel, [place]   1 year ago Paranoid schizophrenia [facility])   [place], Clearnce Sorrel, [place]   1 year ago Paranoid schizophrenia [facility])   [place], [place], [place]   1 year ago Bipolar 1 disorder [facility])   [facility] Fenton Malling M, [place]              Passed - Last BP in normal range    BP Readings from Last 1 Encounters:  01/02/20 114/71           Signed Prescriptions Disp Refills   cetirizine (ZYRTEC) 10 MG tablet 30 tablet 11    Sig: TAKE (1) TABLET BY MOUTH ONCE DAILY.      Ear, Nose, and Throat:  Antihistamines Passed - 08/13/2020  5:37 PM      Passed - Valid encounter within last 12 months    Recent Outpatient Visits           2 months ago Acute midline low back pain without sciatica   [place], Clearnce Sorrel, [place]   7 months ago Paranoid schizophrenia [facility])   [place], Clearnce Sorrel, [place]   1 year ago Paranoid schizophrenia [facility])   [place], Clearnce Sorrel, [place]   1 year ago Paranoid schizophrenia [facility])   [place], [place], [place]   1 year ago Bipolar 1 disorder [facility])   [facility], Anderson Malta M, PA-C                meloxicam (MOBIC) 15 MG tablet 90 tablet 1    Sig: TAKE (1) TABLET BY MOUTH ONCE DAILY.      Analgesics:  COX2 Inhibitors Passed - 08/13/2020  5:37 PM      Passed - HGB in normal range and within 360 days    Hemoglobin  Date Value Ref Range Status  01/02/2020 14.2 13.0 - 17.7 g/dL Final          Passed - Cr in normal range and within 360 days    Creatinine  Date Value Ref Range Status  09/09/2014 1.16 0.60 - 1.30 mg/dL Final   Creatinine, Ser  Date Value Ref Range Status  01/02/2020 1.10  0.76 - 1.27 mg/dL Final          Passed - Patient is not pregnant      Passed - Valid encounter within last 12 months    Recent Outpatient Visits           2 months ago Acute midline low back pain without sciatica   [place], [place], PA-C   7 months ago Paranoid schizophrenia [facility])   [place], Clearnce Sorrel, [place]   1 year ago Paranoid schizophrenia [facility])   [place], Clearnce Sorrel, PA-C   1 year ago Paranoid schizophrenia [facility])   [place], [place], [place]   1 year ago Bipolar 1 disorder [facility])   [place], Jennifer M, PA-C                mirtazapine (REMERON) 30 MG tablet 90 tablet 1    Sig: TAKE 1 TABLET BY MOUTH AT BEDTIME.      Psychiatry: Antidepressants - mirtazapine Failed - 08/13/2020  5:37 PM      Failed - Total Cholesterol in normal range and within 360 days    Cholesterol, Total  Date Value Ref Range Status  01/02/2020 95 (L) 100 - 199 mg/dL Final          Failed - Valid encounter within last 6 months    Recent Outpatient Visits           2 months ago Acute midline low back pain without sciatica   [facility] [place], Anderson Malta M, PA-C   7 months ago Paranoid schizophrenia [facility])   [place], [place], PA-C   1 year ago Paranoid schizophrenia [facility])   [place], [place], PA-C   1 year ago Paranoid schizophrenia [facility])   [facility] [place], [place], PA-C   1 year ago Bipolar 1 disorder [facility])   [facility] Fenton Malling M, [place]              Passed - AST in normal range and within 360 days    AST  Date Value Ref Range Status  01/02/2020 18 0 - 40 IU/L Final   SGOT(AST)  Date Value Ref Range Status  09/09/2014 38 (H) 15 - 37 Unit/L Final          Passed - ALT in normal range and within 360 days    ALT   Date Value Ref Range Status  01/02/2020 15 0 - 44 IU/L Final   SGPT (ALT)  Date Value Ref Range Status  09/09/2014 38 U/L Final    Comment:    14-63 NOTE: New Reference Range 04/25/14           Passed - Triglycerides in normal range and within 360 days    Triglycerides  Date Value Ref Range Status  01/02/2020 117 0 - 149 mg/dL Final          Passed - WBC in normal range and within 360 days    WBC  Date Value Ref Range Status  01/02/2020 5.6 3.4 - 10.8 x10E3/uL  Final  05/21/2018 6.4 3.8 - 10.6 K/uL Final            simvastatin (ZOCOR) 20 MG tablet 90 tablet 1    Sig: TAKE 1 TABLET BY MOUTH AT BEDTIME.      Cardiovascular:  Antilipid - Statins Failed - 08/13/2020  5:37 PM      Failed - Total Cholesterol in normal range and within 360 days    Cholesterol, Total  Date Value Ref Range Status  01/02/2020 95 (L) 100 - 199 mg/dL Final          Failed - LDL in normal range and within 360 days    LDL Chol Calc (NIH)  Date Value Ref Range Status  01/02/2020 40 0 - 99 mg/dL Final          Failed - HDL in normal range and within 360 days    HDL  Date Value Ref Range Status  01/02/2020 34 (L) >39 mg/dL Final          Passed - Triglycerides in normal range and within 360 days    Triglycerides  Date Value Ref Range Status  01/02/2020 117 0 - 149 mg/dL Final          Passed - Patient is not pregnant      Passed - Valid encounter within last 12 months    Recent Outpatient Visits           2 months ago Acute midline low back pain without sciatica   [place], [place], PA-C   7 months ago Paranoid schizophrenia [facility])   [place], Clearnce Sorrel, [place]   1 year ago Paranoid schizophrenia [facility])   [place], Clearnce Sorrel, [place]   1 year ago Paranoid schizophrenia [facility])   [place], Clearnce Sorrel, [place]   1 year ago Bipolar 1 disorder [facility])   [place], [place], [place]

## 2020-08-14 NOTE — Telephone Encounter (Signed)
Not our patient

## 2020-09-14 ENCOUNTER — Ambulatory Visit: Payer: Medicare Other | Admitting: Physician Assistant

## 2020-09-14 ENCOUNTER — Inpatient Hospital Stay
Admission: EM | Admit: 2020-09-14 | Discharge: 2020-09-19 | DRG: 193 | Disposition: A | Discharge status: Home Health | Payer: Medicare Other | Attending: Internal Medicine | Admitting: Internal Medicine

## 2020-09-14 ENCOUNTER — Other Ambulatory Visit: Payer: Self-pay

## 2020-09-14 ENCOUNTER — Emergency Department: Payer: Medicare Other

## 2020-09-14 DIAGNOSIS — F17200 Nicotine dependence, unspecified, uncomplicated: Secondary | ICD-10-CM | POA: Diagnosis not present

## 2020-09-14 DIAGNOSIS — F259 Schizoaffective disorder, unspecified: Secondary | ICD-10-CM | POA: Diagnosis present

## 2020-09-14 DIAGNOSIS — J189 Pneumonia, unspecified organism: Secondary | ICD-10-CM | POA: Diagnosis present

## 2020-09-14 DIAGNOSIS — Z7189 Other specified counseling: Secondary | ICD-10-CM

## 2020-09-14 DIAGNOSIS — R0689 Other abnormalities of breathing: Secondary | ICD-10-CM | POA: Diagnosis not present

## 2020-09-14 DIAGNOSIS — R0602 Shortness of breath: Secondary | ICD-10-CM | POA: Diagnosis present

## 2020-09-14 DIAGNOSIS — R069 Unspecified abnormalities of breathing: Secondary | ICD-10-CM | POA: Diagnosis not present

## 2020-09-14 DIAGNOSIS — C3491 Malignant neoplasm of unspecified part of right bronchus or lung: Secondary | ICD-10-CM

## 2020-09-14 DIAGNOSIS — Z20822 Contact with and (suspected) exposure to covid-19: Secondary | ICD-10-CM | POA: Diagnosis present

## 2020-09-14 DIAGNOSIS — J432 Centrilobular emphysema: Secondary | ICD-10-CM | POA: Diagnosis present

## 2020-09-14 DIAGNOSIS — K219 Gastro-esophageal reflux disease without esophagitis: Secondary | ICD-10-CM | POA: Diagnosis present

## 2020-09-14 DIAGNOSIS — Z515 Encounter for palliative care: Secondary | ICD-10-CM | POA: Diagnosis not present

## 2020-09-14 DIAGNOSIS — E785 Hyperlipidemia, unspecified: Secondary | ICD-10-CM | POA: Diagnosis present

## 2020-09-14 DIAGNOSIS — R531 Weakness: Secondary | ICD-10-CM

## 2020-09-14 DIAGNOSIS — R54 Age-related physical debility: Secondary | ICD-10-CM | POA: Diagnosis present

## 2020-09-14 DIAGNOSIS — Z681 Body mass index (BMI) 19 or less, adult: Secondary | ICD-10-CM | POA: Diagnosis not present

## 2020-09-14 DIAGNOSIS — F200 Paranoid schizophrenia: Secondary | ICD-10-CM | POA: Diagnosis not present

## 2020-09-14 DIAGNOSIS — R062 Wheezing: Secondary | ICD-10-CM | POA: Diagnosis not present

## 2020-09-14 DIAGNOSIS — J80 Acute respiratory distress syndrome: Secondary | ICD-10-CM | POA: Diagnosis not present

## 2020-09-14 DIAGNOSIS — J9621 Acute and chronic respiratory failure with hypoxia: Secondary | ICD-10-CM

## 2020-09-14 DIAGNOSIS — R64 Cachexia: Secondary | ICD-10-CM | POA: Diagnosis present

## 2020-09-14 DIAGNOSIS — Z7951 Long term (current) use of inhaled steroids: Secondary | ICD-10-CM | POA: Diagnosis not present

## 2020-09-14 DIAGNOSIS — J9601 Acute respiratory failure with hypoxia: Secondary | ICD-10-CM | POA: Diagnosis present

## 2020-09-14 DIAGNOSIS — R918 Other nonspecific abnormal finding of lung field: Secondary | ICD-10-CM | POA: Diagnosis not present

## 2020-09-14 DIAGNOSIS — J90 Pleural effusion, not elsewhere classified: Secondary | ICD-10-CM | POA: Diagnosis not present

## 2020-09-14 DIAGNOSIS — Z791 Long term (current) use of non-steroidal anti-inflammatories (NSAID): Secondary | ICD-10-CM

## 2020-09-14 DIAGNOSIS — F17210 Nicotine dependence, cigarettes, uncomplicated: Secondary | ICD-10-CM | POA: Diagnosis present

## 2020-09-14 DIAGNOSIS — C7800 Secondary malignant neoplasm of unspecified lung: Secondary | ICD-10-CM | POA: Diagnosis present

## 2020-09-14 DIAGNOSIS — C3492 Malignant neoplasm of unspecified part of left bronchus or lung: Secondary | ICD-10-CM

## 2020-09-14 DIAGNOSIS — R0902 Hypoxemia: Secondary | ICD-10-CM | POA: Diagnosis not present

## 2020-09-14 DIAGNOSIS — E440 Moderate protein-calorie malnutrition: Secondary | ICD-10-CM | POA: Diagnosis present

## 2020-09-14 DIAGNOSIS — N400 Enlarged prostate without lower urinary tract symptoms: Secondary | ICD-10-CM | POA: Diagnosis present

## 2020-09-14 DIAGNOSIS — Z79899 Other long term (current) drug therapy: Secondary | ICD-10-CM | POA: Diagnosis not present

## 2020-09-14 DIAGNOSIS — F319 Bipolar disorder, unspecified: Secondary | ICD-10-CM | POA: Diagnosis not present

## 2020-09-14 DIAGNOSIS — Z9889 Other specified postprocedural states: Secondary | ICD-10-CM

## 2020-09-14 DIAGNOSIS — J441 Chronic obstructive pulmonary disease with (acute) exacerbation: Secondary | ICD-10-CM

## 2020-09-14 DIAGNOSIS — R059 Cough, unspecified: Secondary | ICD-10-CM | POA: Diagnosis not present

## 2020-09-14 DIAGNOSIS — C3490 Malignant neoplasm of unspecified part of unspecified bronchus or lung: Secondary | ICD-10-CM

## 2020-09-14 LAB — CBC
HCT: 37.7 % — ABNORMAL LOW (ref 39.0–52.0)
Hemoglobin: 11.8 g/dL — ABNORMAL LOW (ref 13.0–17.0)
MCH: 29.1 pg (ref 26.0–34.0)
MCHC: 31.3 g/dL (ref 30.0–36.0)
MCV: 92.9 fL (ref 80.0–100.0)
Platelets: 220 10*3/uL (ref 150–400)
RBC: 4.06 MIL/uL — ABNORMAL LOW (ref 4.22–5.81)
RDW: 15.8 % — ABNORMAL HIGH (ref 11.5–15.5)
WBC: 8.1 10*3/uL (ref 4.0–10.5)
nRBC: 0 % (ref 0.0–0.2)

## 2020-09-14 LAB — COMPREHENSIVE METABOLIC PANEL
ALT: 15 U/L (ref 0–44)
AST: 20 U/L (ref 15–41)
Albumin: 2.8 g/dL — ABNORMAL LOW (ref 3.5–5.0)
Alkaline Phosphatase: 64 U/L (ref 38–126)
Anion gap: 13 (ref 5–15)
BUN: 23 mg/dL (ref 8–23)
CO2: 26 mmol/L (ref 22–32)
Calcium: 8.6 mg/dL — ABNORMAL LOW (ref 8.9–10.3)
Chloride: 101 mmol/L (ref 98–111)
Creatinine, Ser: 1.04 mg/dL (ref 0.61–1.24)
GFR, Estimated: >60 mL/min (ref >60)
Glucose, Bld: 135 mg/dL — ABNORMAL HIGH (ref 70–99)
Potassium: 4 mmol/L (ref 3.5–5.1)
Sodium: 140 mmol/L (ref 135–145)
Total Bilirubin: 0.7 mg/dL (ref 0.3–1.2)
Total Protein: 6.7 g/dL (ref 6.5–8.1)

## 2020-09-14 LAB — BRAIN NATRIURETIC PEPTIDE: B Natriuretic Peptide: 51.7 pg/mL (ref 0.0–100.0)

## 2020-09-14 LAB — RESP PANEL BY RT-PCR (FLU A&B, COVID) ARPGX2
Influenza A by PCR: NEGATIVE
Influenza B by PCR: NEGATIVE
SARS Coronavirus 2 by RT PCR: NEGATIVE

## 2020-09-14 LAB — LACTIC ACID, PLASMA
Lactic Acid, Venous: 1.8 mmol/L (ref 0.5–1.9)
Lactic Acid, Venous: 2 mmol/L (ref 0.5–1.9)

## 2020-09-14 LAB — TROPONIN I (HIGH SENSITIVITY)
Troponin I (High Sensitivity): 4 ng/L (ref <18)
Troponin I (High Sensitivity): 5 ng/L (ref <18)

## 2020-09-14 MED ORDER — ICOSAPENT ETHYL 1 G PO CAPS
2.0000 g | ORAL_CAPSULE | Freq: Two times a day (BID) | ORAL | Status: DC
  Administered 2020-09-15 – 2020-09-18 (×9): 2 g via ORAL
  Filled 2020-09-14 (×15): qty 2

## 2020-09-14 MED ORDER — FLUTICASONE FUROATE-VILANTEROL 200-25 MCG/INH IN AEPB
1.0000 | INHALATION_SPRAY | Freq: Every day | RESPIRATORY_TRACT | Status: DC
  Administered 2020-09-15 – 2020-09-18 (×4): 1 via RESPIRATORY_TRACT
  Filled 2020-09-14: qty 28

## 2020-09-14 MED ORDER — ACETAMINOPHEN 650 MG RE SUPP: 650.0000 mg | Freq: Four times a day (QID) | RECTAL | Status: DC | PRN

## 2020-09-14 MED ORDER — SODIUM CHLORIDE 0.9 % IV SOLN
INTRAVENOUS | Status: DC | PRN
  Administered 2020-09-14: 250 mL via INTRAVENOUS
  Administered 2020-09-17 – 2020-09-18 (×2): 1000 mL via INTRAVENOUS

## 2020-09-14 MED ORDER — SODIUM CHLORIDE 0.9 % IV SOLN
500.0000 mg | INTRAVENOUS | Status: DC
  Administered 2020-09-15 – 2020-09-17 (×3): 500 mg via INTRAVENOUS
  Filled 2020-09-14 (×4): qty 500

## 2020-09-14 MED ORDER — DIVALPROEX SODIUM 500 MG PO DR TAB
500.0000 mg | DELAYED_RELEASE_TABLET | Freq: Two times a day (BID) | ORAL | Status: DC
  Administered 2020-09-15 – 2020-09-19 (×10): 500 mg via ORAL
  Filled 2020-09-14 (×11): qty 1

## 2020-09-14 MED ORDER — LORAZEPAM 0.5 MG PO TABS
0.5000 mg | ORAL_TABLET | Freq: Every day | ORAL | Status: DC
  Administered 2020-09-15 – 2020-09-18 (×5): 0.5 mg via ORAL
  Filled 2020-09-14 (×5): qty 1

## 2020-09-14 MED ORDER — ENOXAPARIN SODIUM 40 MG/0.4ML ~~LOC~~ SOLN
40.0000 mg | SUBCUTANEOUS | Status: DC
  Administered 2020-09-15 – 2020-09-19 (×5): 40 mg via SUBCUTANEOUS
  Filled 2020-09-14 (×5): qty 0.4

## 2020-09-14 MED ORDER — AMANTADINE HCL 100 MG PO CAPS
100.0000 mg | ORAL_CAPSULE | Freq: Two times a day (BID) | ORAL | Status: DC
  Administered 2020-09-15 – 2020-09-19 (×10): 100 mg via ORAL
  Filled 2020-09-14 (×11): qty 1

## 2020-09-14 MED ORDER — TAMSULOSIN HCL 0.4 MG PO CAPS
0.4000 mg | ORAL_CAPSULE | Freq: Every day | ORAL | Status: DC
  Administered 2020-09-15 – 2020-09-18 (×5): 0.4 mg via ORAL
  Filled 2020-09-14 (×5): qty 1

## 2020-09-14 MED ORDER — SODIUM CHLORIDE 0.9 % IV SOLN
1.0000 g | Freq: Once | INTRAVENOUS | Status: AC
  Administered 2020-09-14: 1 g via INTRAVENOUS
  Filled 2020-09-14: qty 10

## 2020-09-14 MED ORDER — CHLORPROMAZINE HCL 25 MG PO TABS
25.0000 mg | ORAL_TABLET | Freq: Three times a day (TID) | ORAL | Status: DC
  Administered 2020-09-15 – 2020-09-19 (×13): 25 mg via ORAL
  Filled 2020-09-14 (×15): qty 1

## 2020-09-14 MED ORDER — SIMVASTATIN 20 MG PO TABS
20.0000 mg | ORAL_TABLET | Freq: Every day | ORAL | Status: DC
  Administered 2020-09-15 – 2020-09-18 (×5): 20 mg via ORAL
  Filled 2020-09-14 (×6): qty 1

## 2020-09-14 MED ORDER — PANTOPRAZOLE SODIUM 40 MG PO TBEC
40.0000 mg | DELAYED_RELEASE_TABLET | Freq: Every day | ORAL | Status: DC
  Administered 2020-09-15 – 2020-09-19 (×5): 40 mg via ORAL
  Filled 2020-09-14 (×5): qty 1

## 2020-09-14 MED ORDER — SODIUM CHLORIDE 0.9 % IV SOLN: INTRAVENOUS | Status: DC

## 2020-09-14 MED ORDER — QUETIAPINE FUMARATE ER 300 MG PO TB24
600.0000 mg | ORAL_TABLET | Freq: Every day | ORAL | Status: DC
  Administered 2020-09-15 – 2020-09-18 (×5): 600 mg via ORAL
  Filled 2020-09-14 (×6): qty 2

## 2020-09-14 MED ORDER — SODIUM CHLORIDE 0.9 % IV SOLN
1.0000 g | INTRAVENOUS | Status: DC
  Administered 2020-09-15 – 2020-09-18 (×4): 1 g via INTRAVENOUS
  Filled 2020-09-14: qty 10
  Filled 2020-09-14 (×4): qty 1

## 2020-09-14 MED ORDER — SODIUM CHLORIDE 0.9 % IV BOLUS
500.0000 mL | Freq: Once | INTRAVENOUS | Status: AC
  Administered 2020-09-14: 500 mL via INTRAVENOUS

## 2020-09-14 MED ORDER — ACETAMINOPHEN 325 MG PO TABS
650.0000 mg | ORAL_TABLET | Freq: Four times a day (QID) | ORAL | Status: DC | PRN
  Administered 2020-09-18 (×2): 650 mg via ORAL
  Filled 2020-09-14 (×3): qty 2

## 2020-09-14 MED ORDER — SODIUM CHLORIDE 0.9 % IV SOLN
500.0000 mg | Freq: Once | INTRAVENOUS | Status: AC
  Administered 2020-09-14: 500 mg via INTRAVENOUS
  Filled 2020-09-14 (×2): qty 500

## 2020-09-14 MED ORDER — ICOSAPENT ETHYL 1 G PO CAPS
2.0000 g | ORAL_CAPSULE | Freq: Two times a day (BID) | ORAL | Status: DC
  Filled 2020-09-14 (×2): qty 2

## 2020-09-14 NOTE — ED Notes (Signed)
Pt resting comfortably at this time. NAD noted. Pt denies any further needs at this time. Pt aware of admission to hospital. Dinner tray ordered for pt. VSS. Pt currently on RA. Call bell in reach.

## 2020-09-14 NOTE — ED Notes (Signed)
Patient provided with meal tray.

## 2020-09-14 NOTE — ED Notes (Signed)
Pt placed on 2L/min while sleeping.

## 2020-09-14 NOTE — ED Notes (Signed)
Pt presents to ED via EMS with c/o of SOB. Pt states he lives at a group home. Pt states he started feeling SOB yesterday and EMS states a HX of COPD, pt states he is not sure of this. Pt have exp and insp wheezes throughout all lobes. Pt is having some accessory muscle breathing but no distress noted. Pt presents with tremors but this RN is told by EMS and pt that this is his baseline. Pt can speak in full sentences at this time without difficulty. Pt denies fevers or chills or any known sick contacts. Pt presents on O2 but denies wearing it chronically. Pt is currently on 1L/min maintaining a O2 sat of 96%. Pt denies chest pain at this time. Pt is A&Ox4.

## 2020-09-14 NOTE — ED Notes (Signed)
Pharmacy contacted regarding missing dose of zithromax.

## 2020-09-14 NOTE — ED Notes (Signed)
Request made for transport to rm [id number]

## 2020-09-14 NOTE — ED Provider Notes (Signed)
[facility] Emergency Department Provider Note ____________________________________________   Event Date/Time   First MD Initiated Contact with Patient 09/14/20 1507     (approximate)  I have reviewed the triage vital signs and the nursing notes.   HISTORY  Chief Complaint Shortness of Breath    HPI Joseph Chucky Homes. is a 69 y.o. male with PMH as noted below who presents from his group home with shortness of breath over the last 3 to 4 days associated with "congestion" in his lungs and cough productive of sputum.  He denies any associated fever chills, chest pain, vomiting, or weakness.   Past Medical History:  Diagnosis Date  . Bipolar 1 disorder ([place])   . Schizo affective schizophrenia [facility])     Patient Active Problem List   Diagnosis Date Noted  . Mild protein-calorie malnutrition ([place]) 12/06/2018  . 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 Sleep disturbance 09/06/2018  . Bipolar 1 disorder ([place]) 04/14/2018  . Schizo affective schizophrenia ([place]) 04/14/2018  . Cognitive impairment 01/06/2018  . Tobacco use disorder 01/02/2018  . Paranoid schizophrenia ([place]) 02/21/2015  . Social maladjustment 02/21/2015    Past Surgical History:  Procedure Laterality Date  . APPENDECTOMY    . HERNIA REPAIR      Prior to Admission medications   Medication Sig Start Date End Date Taking? Authorizing Provider  ADVAIR DISKUS 250-50 MCG/DOSE AEPB INHALE 1 PUFF TWICE DAILY. Patient not taking: Reported on 01/02/2020 11/07/19   Mar Daring, PA-C  amantadine (SYMMETREL) 100 MG capsule TAKE 1 CAPSULE BY MOUTH TWICE DAILY. **DO NOT CRUSH** 08/14/20   Mar Daring, PA-C  Ascorbic Acid (VITAMIN C) 1000 MG tablet TAKE ONE TABLET BY MOUTH EVERY DAY 04/27/20   Mar Daring, PA-C  cetirizine (ZYRTEC) 10 MG tablet TAKE (1) TABLET BY MOUTH ONCE DAILY. 08/13/20   Mar Daring, PA-C  chlorproMAZINE (THORAZINE) 25  MG tablet TAKE 1 TABLET BY MOUTH 3 TIMES DAILY. 08/14/20   Mar Daring, PA-C  Cholecalciferol (VITAMIN D3) 1.25 MG (50000 UT) CAPS TAKE 1 CAPSULE BY MOUTH ONCE A WEEK. 10/13/19   Mar Daring, PA-C  cyanocobalamin 1000 MCG tablet Take 1 tablet (1,000 mcg total) by mouth daily. 03/04/19   Mar Daring, PA-C  divalproex (DEPAKOTE) 500 MG DR tablet TAKE 1 TABLET BY MOUTH TWICE DAILY. **DO NOT CRUSH** 09/15/19   Mar Daring, PA-C  gabapentin (NEURONTIN) 400 MG capsule TAKE 1 TABLET BY MOUTH EVERY MORNING. 09/15/19   Mar Daring, PA-C  LORazepam (ATIVAN) 0.5 MG tablet TAKE 1 TABLET BY MOUTH AT BEDTIME. 07/18/20   [place], Clearnce Sorrel, PA-C  meloxicam (MOBIC) 15 MG tablet TAKE (1) TABLET BY MOUTH ONCE DAILY. 08/13/20   Mar Daring, PA-C  mirtazapine (REMERON) 30 MG tablet TAKE 1 TABLET BY MOUTH AT BEDTIME. 08/13/20   [place], Jennifer M, PA-C  NAMZARIC 28-10 MG CP24 TAKE ONE CAPSULE ONCE A DAY. * DO NOT CRUSH * 04/27/20   Mar Daring, PA-C  omeprazole (PRILOSEC) 20 MG capsule TAKE ONE CAPSULE BY MOUTH EVERY MORNING *DO NOT CRUSH* 04/27/20   Mar Daring, PA-C  QUEtiapine (SEROQUEL XR) 300 MG 24 hr tablet TAKE 2 TABLETS BY MOUTH AT BEDTIME. 08/14/20   Mar Daring, PA-C  simvastatin (ZOCOR) 20 MG tablet TAKE 1 TABLET BY MOUTH AT BEDTIME. 08/13/20   [place], Clearnce Sorrel, PA-C  tamsulosin (FLOMAX) 0.4 MG CAPS capsule TAKE (1) CAPSULE BY MOUTH  AT BEDTIME.*DO NOT CRUSH* 01/02/20   Mar Daring, PA-C  VASCEPA 1 g capsule TAKE 2 CAPSULES BY MOUTH TWO TIMES A DAY 04/27/20   Mar Daring, PA-C  VENTOLIN HFA 108 (90 Base) MCG/ACT inhaler INHALE 2 PUFFS INTO LUNGS EVERY 6 HOURS AS NEEDED FOR WHEEZING OR SHORT OF BREATH 04/27/20   Mar Daring, PA-C  Vitamin D, Ergocalciferol, (DRISDOL) 50000 units CAPS capsule Take 1 capsule (50,000 Units total) by mouth every 7 (seven) days. 01/20/18   Mar Daring, PA-C  amantadine  (SYMMETREL) 100 MG capsule TAKE ONE CAPSULE TWICE A DAY; (DO NOT CRUSH) 04/27/20   Mar Daring, PA-C  chlorproMAZINE (THORAZINE) 25 MG tablet TAKE 1 TABLET BY MOUTH 3 TIMES DAILY. 08/17/19   Mar Daring, PA-C  QUEtiapine (SEROQUEL XR) 300 MG 24 hr tablet Take 2 tablets (600 mg total) by mouth at bedtime. 07/25/19   Mar Daring, PA-C    Allergies Patient has no known allergies.  History reviewed. No pertinent family history.  Social History Social History   Tobacco Use  . Smoking status: Current Every Day Smoker    Packs/day: 0.20    Types: Cigarettes  . Smokeless tobacco: Never Used  . Tobacco comment: pt smokes 1 cigarette a day  Substance Use Topics  . Alcohol use: No  . Drug use: No    Review of Systems  Constitutional: No fever/chills. Eyes: No redness. ENT: Positive for sore throat. Cardiovascular: Denies chest pain. Respiratory: Positive for shortness of breath. Gastrointestinal: No vomiting or diarrhea.  Genitourinary: Negative for dysuria.  Musculoskeletal: Negative for back pain. Skin: Negative for rash. Neurological: Negative for headache.   ____________________________________________   PHYSICAL EXAM:  VITAL SIGNS: ED Triage Vitals  Enc Vitals Group     BP 09/14/20 1409 128/81     Pulse Rate 09/14/20 1409 (!) 101     Resp 09/14/20 1409 20     Temp 09/14/20 1409 98.9 F (37.2 C)     Temp Source 09/14/20 1409 Oral     SpO2 09/14/20 1409 96 %     Weight 09/14/20 1411 170 lb (77.1 kg)     Height 09/14/20 1411 6' (1.829 m)     Head Circumference --      Peak Flow --      Pain Score 09/14/20 1411 0     Pain Loc --      Pain Edu? --      Excl. in [place]? --     Constitutional: Alert and oriented.  Somewhat weak appearing but in no acute distress. Eyes: Conjunctivae are normal.  Head: Atraumatic. Nose: No congestion/rhinnorhea. Mouth/Throat: Mucous membranes are dry. Neck: Normal range of motion.  Cardiovascular: Normal  rate, regular rhythm. Grossly normal heart sounds.  Good peripheral circulation. Respiratory: Slightly increased respiratory effort.  No retractions.  Scattered wheezes bilaterally with good air entry. Gastrointestinal: Soft and nontender. No distention.  Genitourinary: No CVA tenderness. Musculoskeletal: No lower extremity edema.  Extremities warm and well perfused.  Neurologic:  Normal speech and language. No gross focal neurologic deficits are appreciated.  Skin:  Skin is warm and dry. No rash noted. Psychiatric: Mood and affect are normal. Speech and behavior are normal.  ____________________________________________   LABS (all labs ordered are listed, but only abnormal results are displayed)  Labs Reviewed  CBC - Abnormal; Notable for the following components:      Result Value   RBC 4.06 (*)    Hemoglobin 11.8 (*)  HCT 37.7 (*)    RDW 15.8 (*)    All other components within normal limits  COMPREHENSIVE METABOLIC PANEL - Abnormal; Notable for the following components:   Glucose, Bld 135 (*)    Calcium 8.6 (*)    Albumin 2.8 (*)    All other components within normal limits  RESP PANEL BY RT-PCR (FLU A&B, COVID) ARPGX2  BRAIN NATRIURETIC PEPTIDE  LACTIC ACID, PLASMA  LACTIC ACID, PLASMA  TROPONIN I (HIGH SENSITIVITY)  TROPONIN I (HIGH SENSITIVITY)   ____________________________________________  EKG  ED ECG REPORT I, Arta Silence, the attending physician, personally viewed and interpreted this ECG.  Date: 09/14/2020 EKG Time: 1418 Rate: 101 Rhythm: Sinus tachycardia QRS Axis: normal Intervals: Nonspecific IVCD ST/T Wave abnormalities: normal Narrative Interpretation: no evidence of acute ischemia  ____________________________________________  RADIOLOGY  CXR interpreted by me shows bilateral opacities, worse on the right, concerning for pneumonia  ____________________________________________   PROCEDURES  Procedure(s) performed:  No  Procedures  Critical Care performed: No ____________________________________________   INITIAL IMPRESSION / ASSESSMENT AND PLAN / ED COURSE  Pertinent labs & imaging results that were available during my care of the patient were reviewed by me and considered in my medical decision making (see chart for details).  69 year old male with PMH as noted above presents with shortness of breath, cough, and sore throat over the last several days.  I reviewed the past medical records in epic.  The patient has no recent prior ED visits or admissions here.  He has no documented history of pulmonary disease or CHF.  On exam, he is somewhat chronically weak appearing but in no acute distress.  He has increased work of breathing but no acute respiratory distress.  There are some scattered wheezes bilaterally but good air entry.  There is no peripheral edema.  Mucous membranes are dry.  Differential includes bacterial pneumonia, COVID-19, flu, COPD, pulmonary fibrosis, new onset CHF.  We will obtain a chest x-ray, lab work-up, and reassess.  ----------------------------------------- 6:20 PM on 09/14/2020 -----------------------------------------  Chest x-ray reveals multifocal pneumonia, with most of the right lung involved.  The patient's lab work-up is reassuring.  He is not requiring oxygen at this time.  However given the extent of the opacities on the x-ray and the patient's age and overall general weakness, he warrants admission for IV antibiotics and treatment of CAP.  I discussed the case with Dr. Glo Herring from the hospitalist service for admission.  __________________________  Joseph Curb. was evaluated in Emergency Department on 09/14/2020 for the symptoms described in the history of present illness. He was evaluated in the context of the global COVID-19 pandemic, which necessitated consideration that the patient might be at risk for infection with the SARS-CoV-2 virus that causes  COVID-19. Institutional protocols and algorithms that pertain to the evaluation of patients at risk for COVID-19 are in a state of rapid change based on information released by regulatory bodies including the CDC and federal and state organizations. These policies and algorithms were followed during the patient's care in the ED.   ____________________________________________   FINAL CLINICAL IMPRESSION(S) / ED DIAGNOSES  Final diagnoses:  Community acquired pneumonia, unspecified laterality      NEW MEDICATIONS STARTED DURING THIS VISIT:  New Prescriptions   No medications on file     Note:  This document was prepared using Dragon voice recognition software and may include unintentional dictation errors.    Arta Silence, MD 09/14/20 [phone]

## 2020-09-14 NOTE — ED Triage Notes (Signed)
Pt arrived from group home via EMS with c/o of SOB.

## 2020-09-14 NOTE — Progress Notes (Deleted)
Virtual telephone visit    Virtual Visit via Telephone Note   This visit type was conducted due to national recommendations for restrictions regarding the COVID-19 Pandemic (e.g. social distancing) in an effort to limit this patient's exposure and mitigate transmission in our community. Due to his co-morbid illnesses, this patient is at least at moderate risk for complications without adequate follow up. This format is felt to be most appropriate for this patient at this time. The patient did not have access to video technology or had technical difficulties with video requiring transitioning to audio format only (telephone). Physical exam was limited to content and character of the telephone converstion.    Patient location: *** Provider location: ***  I discussed the limitations of evaluation and management by telemedicine and the availability of in person appointments. The patient expressed understanding and agreed to proceed.   Visit Date: 09/14/2020  Today's healthcare provider: Mar Daring, PA-C   No chief complaint on file.  Subjective    HPI  ***    {Show patient history (optional):23778::" "}  Medications: Outpatient Medications Prior to Visit  Medication Sig  . ADVAIR DISKUS 250-50 MCG/DOSE AEPB INHALE 1 PUFF TWICE DAILY. (Patient not taking: Reported on 01/02/2020)  . amantadine (SYMMETREL) 100 MG capsule TAKE 1 CAPSULE BY MOUTH TWICE DAILY. **DO NOT CRUSH**  . Ascorbic Acid (VITAMIN C) 1000 MG tablet TAKE ONE TABLET BY MOUTH EVERY DAY  . cetirizine (ZYRTEC) 10 MG tablet TAKE (1) TABLET BY MOUTH ONCE DAILY.  . chlorproMAZINE (THORAZINE) 25 MG tablet TAKE 1 TABLET BY MOUTH 3 TIMES DAILY.  Marland Kitchen Cholecalciferol (VITAMIN D3) 1.25 MG (50000 UT) CAPS TAKE 1 CAPSULE BY MOUTH ONCE A WEEK.  . cyanocobalamin 1000 MCG tablet Take 1 tablet (1,000 mcg total) by mouth daily.  . divalproex (DEPAKOTE) 500 MG DR tablet TAKE 1 TABLET BY MOUTH TWICE DAILY. **DO NOT CRUSH**  .  gabapentin (NEURONTIN) 400 MG capsule TAKE 1 TABLET BY MOUTH EVERY MORNING.  . LORazepam (ATIVAN) 0.5 MG tablet TAKE 1 TABLET BY MOUTH AT BEDTIME.  . meloxicam (MOBIC) 15 MG tablet TAKE (1) TABLET BY MOUTH ONCE DAILY.  . mirtazapine (REMERON) 30 MG tablet TAKE 1 TABLET BY MOUTH AT BEDTIME.  Marland Kitchen NAMZARIC 28-10 MG CP24 TAKE ONE CAPSULE ONCE A DAY. * DO NOT CRUSH *  . omeprazole (PRILOSEC) 20 MG capsule TAKE ONE CAPSULE BY MOUTH EVERY MORNING *DO NOT CRUSH*  . QUEtiapine (SEROQUEL XR) 300 MG 24 hr tablet TAKE 2 TABLETS BY MOUTH AT BEDTIME.  . simvastatin (ZOCOR) 20 MG tablet TAKE 1 TABLET BY MOUTH AT BEDTIME.  . tamsulosin (FLOMAX) 0.4 MG CAPS capsule TAKE (1) CAPSULE BY MOUTH AT BEDTIME.*DO NOT CRUSH*  . VASCEPA 1 g capsule TAKE 2 CAPSULES BY MOUTH TWO TIMES A DAY  . VENTOLIN HFA 108 (90 Base) MCG/ACT inhaler INHALE 2 PUFFS INTO LUNGS EVERY 6 HOURS AS NEEDED FOR WHEEZING OR SHORT OF BREATH  . Vitamin D, Ergocalciferol, (DRISDOL) 50000 units CAPS capsule Take 1 capsule (50,000 Units total) by mouth every 7 (seven) days.   No facility-administered medications prior to visit.    Review of Systems  {Heme  Chem  Endocrine  Serology  Results Review (optional):23779::" "}  Objective    There were no vitals taken for this visit. {Show previous vital signs (optional):23777::" "}    Assessment & Plan     ***  No follow-ups on file.    I discussed the assessment and treatment plan with  the patient. The patient was provided an opportunity to ask questions and all were answered. The patient agreed with the plan and demonstrated an understanding of the instructions.   The patient was advised to call back or seek an in-person evaluation if the symptoms worsen or if the condition fails to improve as anticipated.  I provided *** minutes of non-face-to-face time during this encounter.  {provider attestation***:1}  [place] [facility] [phone]  (phone) [phone] (fax)  [place]

## 2020-09-14 NOTE — H&P (Signed)
History and Physical    PLEASE NOTE THAT DRAGON DICTATION SOFTWARE WAS USED IN THE CONSTRUCTION OF THIS NOTE.   Joseph Holt. OZH:[id number] DOB: [birth year] DOA: 09/14/2020  PCP: Mar Daring, PA-C Patient coming from: Group home  I have personally briefly reviewed patient's old medical records in [place]  Chief Complaint: Shortness of breath  HPI: Joseph Holt. is a 69 y.o. male with medical history significant for bipolar, chronic tobacco abuse, GERD, hyperlipidemia, BPH, who is admitted to [facility] on 09/14/2020 with community-acquired pneumonia after presenting from group home to [facility] Emergency Department complaining of shortness of breath.   The patient reports 3 to 4 days of progressive shortness of breath associated with productive cough.  He denies any associated hemoptysis, orthopnea, PND, or peripheral edema.  He also denies any associated subjective fever, chills, rigors, or generalized myalgias.  Not associate with any neck stiffness, rhinitis, rhinorrhea, sore throat, wheezing, nausea, vomiting, abdominal pain, diarrhea, or rash.  No recent traveling or known COVID-19 exposures.  He denies any dysuria, gross hematuria, or change in urinary urgency/frequency.  Denies any recent chest pain, diaphoresis, or palpitations.  Trauma or travel.  No recent surgical procedures periods of prolonged menstrual Joseph Holt activity.  No personal history of DVT/PE.  Denies any recent or lower extremity erythema.  Per chart review, it does not appear that the patient has underwent any recent hospitalization.    ED Course:  Vital signs in the ED were notable for the following: Temperature max 98.9; heart rate 93-1 07, blood pressure 121/68, respiratory rate 24-31, and oxygen saturation 96 to 97% on room air.  Labs were notable for the following: BMP notable for the following: Sodium 140, potassium 4.0, bicarbonate 26, creatinine 1.04.  BNP  52.  High-sensitivity troponin I x2 found to be 4 and then 5 respectively.  CBC was notable for white cell count of 8100, hemoglobin 11.8.  Lactic acid 2.0.  Nasopharyngeal COVID-19/influenza PCR was checked in the ED this evening found to be negative.  Chest x-ray showed evidence of diffuse bilateral airspace opacity on the right greater than the left to suggest bilateral pneumonia in the absence of any evidence of pleural effusion or pneumothorax.  While in the ED, the following were administered: Azithromycin 500 mg IV x1 and Rocephin 1 g IV x1 in addition to a 500 cc normal saline bolus.  Subsequently, the patient was admitted to the mental a suspected presenting community-acquired pneumonia.    Review of Systems: As per HPI otherwise 10 point review of systems negative.   Past Medical History:  Diagnosis Date  . Bipolar 1 disorder ([place])   . Schizo affective schizophrenia [facility])     Past Surgical History:  Procedure Laterality Date  . APPENDECTOMY    . HERNIA REPAIR      Social History:  reports that he has been smoking cigarettes. He has been smoking about 0.20 packs per day. He has never used smokeless tobacco. He reports that he does not drink alcohol and does not use drugs.   No Known Allergies  History reviewed. No pertinent family history.  Family history reviewed and not pertinent    Prior to Admission medications   Medication Sig Start Date End Date Taking? Authorizing Provider  ADVAIR DISKUS 250-50 MCG/DOSE AEPB INHALE 1 PUFF TWICE DAILY. Patient not taking: Reported on 01/02/2020 11/07/19   Mar Daring, PA-C  amantadine (SYMMETREL) 100 MG capsule TAKE 1 CAPSULE BY MOUTH  TWICE DAILY. **DO NOT CRUSH** 08/14/20   Mar Daring, PA-C  Ascorbic Acid (VITAMIN C) 1000 MG tablet TAKE ONE TABLET BY MOUTH EVERY DAY 04/27/20   Mar Daring, PA-C  cetirizine (ZYRTEC) 10 MG tablet TAKE (1) TABLET BY MOUTH ONCE DAILY. 08/13/20   Mar Daring, PA-C   chlorproMAZINE (THORAZINE) 25 MG tablet TAKE 1 TABLET BY MOUTH 3 TIMES DAILY. 08/14/20   Mar Daring, PA-C  Cholecalciferol (VITAMIN D3) 1.25 MG (50000 UT) CAPS TAKE 1 CAPSULE BY MOUTH ONCE A WEEK. 10/13/19   Mar Daring, PA-C  cyanocobalamin 1000 MCG tablet Take 1 tablet (1,000 mcg total) by mouth daily. 03/04/19   Mar Daring, PA-C  divalproex (DEPAKOTE) 500 MG DR tablet TAKE 1 TABLET BY MOUTH TWICE DAILY. **DO NOT CRUSH** 09/15/19   Mar Daring, PA-C  gabapentin (NEURONTIN) 400 MG capsule TAKE 1 TABLET BY MOUTH EVERY MORNING. 09/15/19   Mar Daring, PA-C  LORazepam (ATIVAN) 0.5 MG tablet TAKE 1 TABLET BY MOUTH AT BEDTIME. 07/18/20   Burnette, Clearnce Sorrel, PA-C  meloxicam (MOBIC) 15 MG tablet TAKE (1) TABLET BY MOUTH ONCE DAILY. 08/13/20   Mar Daring, PA-C  mirtazapine (REMERON) 30 MG tablet TAKE 1 TABLET BY MOUTH AT BEDTIME. 08/13/20   Burnette, Jennifer M, PA-C  NAMZARIC 28-10 MG CP24 TAKE ONE CAPSULE ONCE A DAY. * DO NOT CRUSH * 04/27/20   Mar Daring, PA-C  omeprazole (PRILOSEC) 20 MG capsule TAKE ONE CAPSULE BY MOUTH EVERY MORNING *DO NOT CRUSH* 04/27/20   Mar Daring, PA-C  QUEtiapine (SEROQUEL XR) 300 MG 24 hr tablet TAKE 2 TABLETS BY MOUTH AT BEDTIME. 08/14/20   Mar Daring, PA-C  simvastatin (ZOCOR) 20 MG tablet TAKE 1 TABLET BY MOUTH AT BEDTIME. 08/13/20   Mar Daring, PA-C  tamsulosin (FLOMAX) 0.4 MG CAPS capsule TAKE (1) CAPSULE BY MOUTH AT BEDTIME.*DO NOT CRUSH* 01/02/20   Mar Daring, PA-C  VASCEPA 1 g capsule TAKE 2 CAPSULES BY MOUTH TWO TIMES A DAY 04/27/20   Mar Daring, PA-C  VENTOLIN HFA 108 (90 Base) MCG/ACT inhaler INHALE 2 PUFFS INTO LUNGS EVERY 6 HOURS AS NEEDED FOR WHEEZING OR SHORT OF BREATH 04/27/20   Mar Daring, PA-C  Vitamin D, Ergocalciferol, (DRISDOL) 50000 units CAPS capsule Take 1 capsule (50,000 Units total) by mouth every 7 (seven) days. 01/20/18   Mar Daring, PA-C  amantadine (SYMMETREL) 100 MG capsule TAKE ONE CAPSULE TWICE A DAY; (DO NOT CRUSH) 04/27/20   Mar Daring, PA-C  chlorproMAZINE (THORAZINE) 25 MG tablet TAKE 1 TABLET BY MOUTH 3 TIMES DAILY. 08/17/19   Mar Daring, PA-C  QUEtiapine (SEROQUEL XR) 300 MG 24 hr tablet Take 2 tablets (600 mg total) by mouth at bedtime. 07/25/19   Mar Daring, PA-C     Objective    Physical Exam: Vitals:   09/14/20 1409 09/14/20 1411 09/14/20 1706  BP: 128/81  (!) 145/83  Pulse: (!) 101  97  Resp: 20  (!) 23  Temp: 98.9 F (37.2 C)    TempSrc: Oral    SpO2: 96%  96%  Weight:  77.1 kg   Height:  6' (1.829 m)     General: appears to be stated age; alert, oriented; increased work of breathing noted Skin: warm, dry, no rash Head:  AT/[place] Mouth:  Oral mucosa membranes appear moist, normal dentition Neck: supple; trachea midline Heart: Mildly tachycardic, but regular; did not appreciate  any M/R/G Lungs: CTAB, did not appreciate any wheezes, rales, or rhonchi Abdomen: + BS; soft, ND, NT Vascular: 2+ pedal pulses b/l; 2+ radial pulses b/l Extremities: no peripheral edema, no muscle wasting Neuro: strength and sensation intact in upper and lower extremities b/l  Labs on Admission: I have personally reviewed following labs and imaging studies  CBC: Recent Labs  Lab 09/14/20 1438  WBC 8.1  HGB 11.8*  HCT 37.7*  MCV 92.9  PLT 175   Basic Metabolic Panel: Recent Labs  Lab 09/14/20 1438  NA 140  K 4.0  CL 101  CO2 26  GLUCOSE 135*  BUN 23  CREATININE 1.04  CALCIUM 8.6*   GFR: Estimated Creatinine Clearance: 73.1 mL/min (by C-G formula based on SCr of 1.04 mg/dL). Liver Function Tests: Recent Labs  Lab 09/14/20 1438  AST 20  ALT 15  ALKPHOS 64  BILITOT 0.7  PROT 6.7  ALBUMIN 2.8*   No results for input(s): LIPASE, AMYLASE in the last 168 hours. No results for input(s): AMMONIA in the last 168 hours. Coagulation Profile: No results for  input(s): INR, PROTIME in the last 168 hours. Cardiac Enzymes: No results for input(s): CKTOTAL, CKMB, CKMBINDEX, TROPONINI in the last 168 hours. BNP (last 3 results) No results for input(s): PROBNP in the last 8760 hours. HbA1C: No results for input(s): HGBA1C in the last 72 hours. CBG: No results for input(s): GLUCAP in the last 168 hours. Lipid Profile: No results for input(s): CHOL, HDL, LDLCALC, TRIG, CHOLHDL, LDLDIRECT in the last 72 hours. Thyroid Function Tests: No results for input(s): TSH, T4TOTAL, FREET4, T3FREE, THYROIDAB in the last 72 hours. Anemia Panel: No results for input(s): VITAMINB12, FOLATE, FERRITIN, TIBC, IRON, RETICCTPCT in the last 72 hours. Urine analysis:    Component Value Date/Time   COLORURINE YELLOW (A) 01/06/2018 1335   APPEARANCEUR CLEAR (A) 01/06/2018 1335   APPEARANCEUR Clear 09/09/2014 1421   LABSPEC 1.014 01/06/2018 1335   LABSPEC 1.027 09/09/2014 1421   PHURINE 7.0 01/06/2018 1335   GLUCOSEU NEGATIVE 01/06/2018 1335   GLUCOSEU Negative 09/09/2014 1421   HGBUR NEGATIVE 01/06/2018 1335   BILIRUBINUR NEGATIVE 01/06/2018 1335   BILIRUBINUR Negative 09/09/2014 1421   KETONESUR 5 (A) 01/06/2018 1335   PROTEINUR NEGATIVE 01/06/2018 1335   NITRITE NEGATIVE 01/06/2018 1335   LEUKOCYTESUR NEGATIVE 01/06/2018 1335   LEUKOCYTESUR Negative 09/09/2014 1421    Radiological Exams on Admission: DG Chest Portable 1 View  Result Date: 09/14/2020 CLINICAL DATA:  Short of breath.  History of COPD EXAM: PORTABLE CHEST 1 VIEW COMPARISON:  09/01/2005 FINDINGS: Extensive airspace disease on the right. Nodular type densities in the left lung, especially the left upper lobe medially. Findings compatible with pneumonia. Small right effusion. Heart size normal.  Vascularity normal. IMPRESSION: Diffuse bilateral airspace disease right greater than left. Probable pneumonia. Nodular component to the airspace disease on the left possibly due to lung nodules. Follow-up  until clearing recommended. Electronically Signed   By: Franchot Gallo M.D.   On: 09/14/2020 15:17    Assessment/Plan   Joseph Holt. is a 69 y.o. male with medical history significant for bipolar, chronic tobacco abuse, GERD, hyperlipidemia, BPH, who is admitted to [facility] on 09/14/2020 with community-acquired pneumonia after presenting from group home to [facility] complaining of shortness of breath.    Principal Problem:   CAP (community acquired pneumonia) Active Problems:   Tobacco use disorder   Bipolar 1 disorder (HCC)   Shortness of breath  Generalized weakness   GERD (gastroesophageal reflux disease)    #) Community-acquired pneumonia: Diagnosis on the basis of 3 to 4 days of progressive shortness of breath associated with new onset productive cough, while chest x-ray shows evidence of diffuse bilateral airspace opacity.  Left suggest pneumonia, all in the context of no documented history of recent hospitalization.  The patient is mildly tachycardic and mildly tachypneic, SIRS criteria are not met for sepsis criteria, as there is no evidence of fever or leukocytosis.  Of note, COVID-19 PCR found to be negative in the ED this evening.  While still in the ED, single doses of azithromycin and Rocephin were administered.  No evidence of hypotension, and the patient is maintaining saturations on room air.  Plan: Continue azithromycin and Rocephin as community-acquired pneumonia coverage.  Check blood cultures x2 now gentle IV fluids overnight, tickle in the setting of mildly elevated lactic acid, with plan to repeat level in the morning.  Repeat CBC with differential in the morning.  Repeat BMP in the morning.  Flutter valve.     #)Generalized weakness: The patient reports generalized weakness in the absence of any acute focal neurologic deficits over the 3 to 4-day timeframe that he presents as new onset progressive shortness of breath  and cough, as above.  Suspect that this generalized weakness is on the basis of physiologic stress stemming from underlying community-acquired pneumonia.  Plan: Check TSH.  Work-up and management of presenting COVID-19, as above.  Physical therapy consult to occur in the morning.     #) Chronic tobacco abuse: Patient reports that he is a current smoker, and very, he reports overall between a has been doing so for several years.  Plan: Counseled the patient discontinuation.     #.  GERD: On omeprazole as an outpatient.  Plan: Continue home PPI.    #) Hyperlipidemia: On simvastatin as well as Vascepa at home. Continue home lipid-lowering regimen.     #) BPH: On Flomax as an outpatient.  Plan: Continue flomax. Monitor strict I's and O's.  Repeat BMP in the morning.    #) Bipolar disorder: The patient is on several mental health related medications at home, and given the sheer volume of these medications as well as the appearance of some redundancy in terms of multiple medications from the same class being represented, I have placed an inpatient pharmacy consult for their assistance with accurately reconcile the patient's home medication list, in particular his mental health medications.  Plan: We will follow this patient pharmacy consultation patient.    DVT prophylaxis: Lovenox 40 mg subcu daily Code Status: Full code Family Communication: none Disposition Plan: Per Rounding Team Consults called: none  Admission status: Inpatient; med telemetry    Of note, this patient was added by me to the following Admit List/Treatment Team:  armcadmits     PLEASE NOTE THAT DRAGON DICTATION SOFTWARE WAS USED IN THE CONSTRUCTION OF THIS NOTE.   [place] Pager [phone] From 12PM- 12AM  Otherwise, please contact night-coverage  [email or web address] Password [email or web address]  09/14/2020, 6:10 PM

## 2020-09-15 ENCOUNTER — Inpatient Hospital Stay: Payer: Medicare Other

## 2020-09-15 ENCOUNTER — Encounter: Payer: Self-pay | Admitting: Internal Medicine

## 2020-09-15 DIAGNOSIS — J9601 Acute respiratory failure with hypoxia: Secondary | ICD-10-CM

## 2020-09-15 DIAGNOSIS — J441 Chronic obstructive pulmonary disease with (acute) exacerbation: Secondary | ICD-10-CM

## 2020-09-15 DIAGNOSIS — J9621 Acute and chronic respiratory failure with hypoxia: Secondary | ICD-10-CM

## 2020-09-15 DIAGNOSIS — E785 Hyperlipidemia, unspecified: Secondary | ICD-10-CM

## 2020-09-15 LAB — COMPREHENSIVE METABOLIC PANEL
ALT: 13 U/L (ref 0–44)
AST: 13 U/L — ABNORMAL LOW (ref 15–41)
Albumin: 2.3 g/dL — ABNORMAL LOW (ref 3.5–5.0)
Alkaline Phosphatase: 53 U/L (ref 38–126)
Anion gap: 7 (ref 5–15)
BUN: 17 mg/dL (ref 8–23)
CO2: 27 mmol/L (ref 22–32)
Calcium: 8.3 mg/dL — ABNORMAL LOW (ref 8.9–10.3)
Chloride: 104 mmol/L (ref 98–111)
Creatinine, Ser: 0.85 mg/dL (ref 0.61–1.24)
GFR, Estimated: >60 mL/min (ref >60)
Glucose, Bld: 94 mg/dL (ref 70–99)
Potassium: 3.9 mmol/L (ref 3.5–5.1)
Sodium: 138 mmol/L (ref 135–145)
Total Bilirubin: 0.5 mg/dL (ref 0.3–1.2)
Total Protein: 5.6 g/dL — ABNORMAL LOW (ref 6.5–8.1)

## 2020-09-15 LAB — CBC WITH DIFFERENTIAL/PLATELET
Abs Immature Granulocytes: 0.11 10*3/uL — ABNORMAL HIGH (ref 0.00–0.07)
Basophils Absolute: 0 10*3/uL (ref 0.0–0.1)
Basophils Relative: 0 %
Eosinophils Absolute: 0.2 10*3/uL (ref 0.0–0.5)
Eosinophils Relative: 3 %
HCT: 32.8 % — ABNORMAL LOW (ref 39.0–52.0)
Hemoglobin: 10.5 g/dL — ABNORMAL LOW (ref 13.0–17.0)
Immature Granulocytes: 1 %
Lymphocytes Relative: 14 %
Lymphs Abs: 1 10*3/uL (ref 0.7–4.0)
MCH: 29.4 pg (ref 26.0–34.0)
MCHC: 32 g/dL (ref 30.0–36.0)
MCV: 91.9 fL (ref 80.0–100.0)
Monocytes Absolute: 1.3 10*3/uL — ABNORMAL HIGH (ref 0.1–1.0)
Monocytes Relative: 17 %
Neutro Abs: 5 10*3/uL (ref 1.7–7.7)
Neutrophils Relative %: 65 %
Platelets: 221 10*3/uL (ref 150–400)
RBC: 3.57 MIL/uL — ABNORMAL LOW (ref 4.22–5.81)
RDW: 15.8 % — ABNORMAL HIGH (ref 11.5–15.5)
WBC: 7.6 10*3/uL (ref 4.0–10.5)
nRBC: 0 % (ref 0.0–0.2)

## 2020-09-15 LAB — HIV ANTIBODY (ROUTINE TESTING W REFLEX): HIV Screen 4th Generation wRfx: NONREACTIVE

## 2020-09-15 LAB — TSH: TSH: 1.912 u[IU]/mL (ref 0.350–4.500)

## 2020-09-15 LAB — MAGNESIUM: Magnesium: 1.8 mg/dL (ref 1.7–2.4)

## 2020-09-15 LAB — LACTIC ACID, PLASMA: Lactic Acid, Venous: 1.1 mmol/L (ref 0.5–1.9)

## 2020-09-15 LAB — PHOSPHORUS: Phosphorus: 3.5 mg/dL (ref 2.5–4.6)

## 2020-09-15 MED ORDER — VITAMIN B-12 1000 MCG PO TABS
1000.0000 ug | ORAL_TABLET | Freq: Every day | ORAL | Status: DC
  Administered 2020-09-15 – 2020-09-19 (×5): 1000 ug via ORAL
  Filled 2020-09-15 (×5): qty 1

## 2020-09-15 MED ORDER — METHYLPREDNISOLONE SODIUM SUCC 40 MG IJ SOLR
40.0000 mg | Freq: Every day | INTRAMUSCULAR | Status: DC
  Administered 2020-09-15 – 2020-09-19 (×5): 40 mg via INTRAVENOUS
  Filled 2020-09-15 (×5): qty 1

## 2020-09-15 MED ORDER — MIRTAZAPINE 15 MG PO TABS
30.0000 mg | ORAL_TABLET | Freq: Every day | ORAL | Status: DC
  Administered 2020-09-15 – 2020-09-18 (×4): 30 mg via ORAL
  Filled 2020-09-15 (×4): qty 2

## 2020-09-15 MED ORDER — MEMANTINE HCL-DONEPEZIL HCL ER 28-10 MG PO CP24: 1.0000 | ORAL_CAPSULE | Freq: Every day | ORAL | Status: DC

## 2020-09-15 MED ORDER — MEMANTINE HCL ER 28 MG PO CP24
28.0000 mg | ORAL_CAPSULE | Freq: Every day | ORAL | Status: DC
Start: 2020-09-15 — End: 2020-09-20
  Administered 2020-09-15 – 2020-09-19 (×5): 28 mg via ORAL
  Filled 2020-09-15 (×5): qty 1

## 2020-09-15 MED ORDER — DONEPEZIL HCL 5 MG PO TABS
10.0000 mg | ORAL_TABLET | Freq: Every day | ORAL | Status: DC
  Administered 2020-09-15 – 2020-09-19 (×5): 10 mg via ORAL
  Filled 2020-09-15 (×5): qty 2

## 2020-09-15 MED ORDER — GABAPENTIN 400 MG PO CAPS
400.0000 mg | ORAL_CAPSULE | Freq: Every day | ORAL | Status: DC
  Administered 2020-09-16 – 2020-09-19 (×4): 400 mg via ORAL
  Filled 2020-09-15 (×4): qty 1

## 2020-09-15 MED ORDER — IOHEXOL 300 MG/ML  SOLN
75.0000 mL | Freq: Once | INTRAMUSCULAR | Status: AC | PRN
  Administered 2020-09-15: 14:00:00 75 mL via INTRAVENOUS

## 2020-09-15 NOTE — Evaluation (Signed)
Physical Therapy Evaluation Patient Details Name: Joseph Holt. MRN: [id number] DOB: 1951/04/04 Today's Date: 09/15/2020   History of Present Illness  Pt is a 69 y/o M who presented to ED from group home on 09/14/20 with c/o SOB & admitted for tx of CAP. PMH: bipolar, chronic tobacco abuse, GERD, HLD, BPH  Clinical Impression  Pt agreeable to tx with encouragement. Pt received in bed with nasal cannula doffed & pt on room air, SpO2 = 85% & pt placed back on 2L/min. Pt requires cuing to close mouth & breath through nose but poor return demo & SpO2 requires extra time to increase to 89-90%, HR = 104 bpm at rest. Pt requires min assist for bed mobility, min/mod assist sit<>stand & declines further gait after stand pivot to recliner. Pt performs BLE LAQ, seated marches, and hip adduction squeezes with multimodal cuing for technique. Nurse made aware of pt's SPO2 remaining 89-90% on 2L/min. Pt would benefit from ongoing acute PT services to address deficits noted to increase safety with functional mobility. Recommending 24 hr assist & HHPT following d/c from hospital.     Follow Up Recommendations Home health PT;Supervision/Assistance - 24 hour;Supervision for mobility/OOB    Equipment Recommendations  Rolling walker with 5" wheels    Recommendations for Other Services       Precautions / Restrictions Precautions Precautions: Fall Restrictions Weight Bearing Restrictions: No      Mobility  Bed Mobility Overal bed mobility: Needs Assistance Bed Mobility: Supine to Sit     Supine to sit: HOB elevated;Min assist     General bed mobility comments: cuing/assist to upright trunk to sitting EOB    Transfers Overall transfer level: Needs assistance Equipment used: Rolling walker (2 wheeled) Transfers: Sit to/from [facility] Sit to Stand: Min assist;Mod assist (pt pushes back on bed with BLE, requires momentum to transfer sit>stand) Stand pivot transfers: Min  assist (shuffled steps to recliner)          Ambulation/Gait Ambulation/Gait assistance:  (pt declined)              Stairs            Wheelchair Mobility    Modified Rankin (Stroke Patients Only)       Balance Overall balance assessment: Needs assistance Sitting-balance support: Feet supported Sitting balance-Leahy Scale: Fair Sitting balance - Comments: close supervision static sitting balance   Standing balance support: During functional activity;Bilateral upper extremity supported Standing balance-Leahy Scale: Poor Standing balance comment: BUE support on RW & min assist for stand pivot transfers                             Pertinent Vitals/Pain Pain Assessment: No/denies pain    Home Living Family/patient expects to be discharged to:: Group home                      Prior Function Level of Independence:  (unsure, pt reports he lives alone & has a walker but per chart pt lives in a group home)               Hand Dominance        Extremity/Trunk Assessment   Upper Extremity Assessment Upper Extremity Assessment: Generalized weakness (tremulous movements in BUE)    Lower Extremity Assessment Lower Extremity Assessment: Generalized weakness       Communication      Cognition Arousal/Alertness: Awake/alert  Overall Cognitive Status: Difficult to assess                                 General Comments: Pt provides PLOF/home set up information that contradicts what's in the medical record. Pt does not attempt to recall current month/year, instead states "I don't know"      General Comments General comments (skin integrity, edema, etc.): Pt declines gait in room despite encouragement, requires encouragement to complete seated exercises.    Exercises     Assessment/Plan    PT Assessment Patient needs continued PT services  PT Problem List Decreased strength;Decreased mobility;Decreased safety  awareness;Decreased coordination;Decreased range of motion;Decreased activity tolerance;Decreased cognition;Cardiopulmonary status limiting activity;Decreased balance;Decreased knowledge of use of DME       PT Treatment Interventions DME instruction;Therapeutic activities;Cognitive remediation;Patient/family education;Therapeutic exercise;Gait training;Stair training;Balance training;Manual techniques;Neuromuscular re-education;Functional mobility training    PT Goals (Current goals can be found in the Care Plan section)  Acute Rehab PT Goals Patient Stated Goal: none stated PT Goal Formulation: With patient Time For Goal Achievement: 09/29/20 Potential to Achieve Goals: Fair    Frequency Min 2X/week   Barriers to discharge Decreased caregiver support;Inaccessible home environment      Co-evaluation               AM-PAC PT "6 Clicks" Mobility  Outcome Measure Help needed turning from your back to your side while in a flat bed without using bedrails?: A Little Help needed moving from lying on your back to sitting on the side of a flat bed without using bedrails?: A Little Help needed moving to and from a bed to a chair (including a wheelchair)?: A Little Help needed standing up from a chair using your arms (e.g., wheelchair or bedside chair)?: A Lot Help needed to walk in hospital room?: A Lot Help needed climbing 3-5 steps with a railing? : A Lot 6 Click Score: 15    End of Session Equipment Utilized During Treatment: Gait belt Activity Tolerance: Patient tolerated treatment well Patient left: in chair;with chair alarm set;with call bell/phone within reach Nurse Communication: Mobility status (O2) PT Visit Diagnosis: Unsteadiness on feet (R26.81);Difficulty in walking, not elsewhere classified (R26.2);Muscle weakness (generalized) (M62.81)    Time: 0539-7673 PT Time Calculation (min) (ACUTE ONLY): 22 min   Charges:   PT Evaluation $PT Eval Low Complexity: 1 Low PT  Treatments $Therapeutic Activity: 8-22 mins        Lavone Nian, PT, DPT 09/15/20, 12:58 PM   Waunita Schooner 09/15/2020, 12:54 PM

## 2020-09-15 NOTE — Progress Notes (Signed)
PHARMACY -  BRIEF MEDICATION NOTE   Pharmacy has received consult(s) for Med Review from an ED provider.  The patient's profile has been reviewed for ht/wt/allergies/indication/available labs.    Med Rec completed per pt's nursing home med admin guide.  Further antibiotics/pharmacy consults should be ordered by admitting physician if indicated.                        Renda Rolls, PharmD, [facility] 09/15/2020 3:41 AM

## 2020-09-15 NOTE — Plan of Care (Signed)
Continuing with plan of care. 

## 2020-09-15 NOTE — Progress Notes (Signed)
Patient ID: Joseph Curb., male   DOB: Jul 30, 1951, 69 y.o.   MRN: [id number] [facility] PROGRESS NOTE  Joseph Curb. PPI:[id number] DOB: 09/28/1951 DOA: 09/14/2020 PCP: Mar Daring, PA-C  HPI/Subjective: Patient feels a little bit better today.  Still has some cough and shortness of breath.  Off oxygen this morning.  States he has no problem swallowing.  States he quit smoking a month and a half ago.  Objective: Vitals:   09/15/20 0443 09/15/20 1214  BP: 116/69 136/69  Pulse: 97 (!) 106  Resp: 19 20  Temp: 98.5 F (36.9 C) 98.5 F (36.9 C)  SpO2: 92% 95%    Intake/Output Summary (Last 24 hours) at 09/15/2020 1324 Last data filed at 09/15/2020 1224 Gross per 24 hour  Intake 1357.02 ml  Output 400 ml  Net 957.02 ml   Filed Weights   09/14/20 1411 09/14/20 1954  Weight: 77.1 kg 78.2 kg    ROS: Review of Systems  Respiratory: Positive for cough, shortness of breath and wheezing.   Cardiovascular: Negative for chest pain.  Gastrointestinal: Negative for abdominal pain, nausea and vomiting.   Exam: Physical Exam HENT:     Head: Normocephalic.     Mouth/Throat:     Pharynx: No oropharyngeal exudate.  Eyes:     General: Lids are normal.     Conjunctiva/sclera: Conjunctivae normal.  Cardiovascular:     Rate and Rhythm: Normal rate and regular rhythm.     Heart sounds: Normal heart sounds, S1 normal and S2 normal.  Pulmonary:     Breath sounds: No decreased breath sounds, wheezing, rhonchi or rales.  Abdominal:     Palpations: Abdomen is soft.     Tenderness: There is no abdominal tenderness.  Musculoskeletal:     Right ankle: No swelling.     Left ankle: No swelling.  Skin:    General: Skin is warm.     Findings: No rash.  Neurological:     Mental Status: He is alert.     Comments: Answers some yes or no questions.       Data Reviewed: Basic Metabolic Panel: Recent Labs  Lab 09/14/20 1438 09/15/20 0523  NA 140 138  K 4.0  3.9  CL 101 104  CO2 26 27  GLUCOSE 135* 94  BUN 23 17  CREATININE 1.04 0.85  CALCIUM 8.6* 8.3*  MG  --  1.8  PHOS  --  3.5   Liver Function Tests: Recent Labs  Lab 09/14/20 1438 09/15/20 0523  AST 20 13*  ALT 15 13  ALKPHOS 64 53  BILITOT 0.7 0.5  PROT 6.7 5.6*  ALBUMIN 2.8* 2.3*   CBC: Recent Labs  Lab 09/14/20 1438 09/15/20 0523  WBC 8.1 7.6  NEUTROABS  --  5.0  HGB 11.8* 10.5*  HCT 37.7* 32.8*  MCV 92.9 91.9  PLT 220 221   BNP (last 3 results) Recent Labs    09/14/20 1438  BNP 51.7     Recent Results (from the past 240 hour(s))  Resp Panel by RT-PCR (Flu A&B, Covid) Nasopharyngeal Swab     Status: None   Collection Time: 09/14/20  2:22 PM   Specimen: Nasopharyngeal Swab; Nasopharyngeal(NP) swabs in vial transport medium  Result Value Ref Range Status   SARS Coronavirus 2 by RT PCR NEGATIVE NEGATIVE Final    Comment: (NOTE) SARS-CoV-2 target nucleic acids are NOT DETECTED.  The SARS-CoV-2 RNA is generally detectable in upper respiratory specimens during the acute  phase of infection. The lowest concentration of SARS-CoV-2 viral copies this assay can detect is 138 copies/mL. A negative result does not preclude SARS-Cov-2 infection and should not be used as the sole basis for treatment or other patient management decisions. A negative result may occur with  improper specimen collection/handling, submission of specimen other than nasopharyngeal swab, presence of viral mutation(s) within the areas targeted by this assay, and inadequate number of viral copies(<138 copies/mL). A negative result must be combined with clinical observations, patient history, and epidemiological information. The expected result is Negative.  Fact Sheet for Patients:  EntrepreneurPulse.com.au  Fact Sheet for Healthcare Providers:  IncredibleEmployment.be  This test is no t yet approved or cleared by the [place] FDA and  has been  authorized for detection and/or diagnosis of SARS-CoV-2 by FDA under an Emergency Use Authorization (EUA). This EUA will remain  in effect (meaning this test can be used) for the duration of the COVID-19 declaration under Section 564(b)(1) of the Act, 21 U.S.C.section 360bbb-3(b)(1), unless the authorization is terminated  or revoked sooner.       Influenza A by PCR NEGATIVE NEGATIVE Final   Influenza B by PCR NEGATIVE NEGATIVE Final    Comment: (NOTE) The Xpert Xpress SARS-CoV-2/FLU/RSV plus assay is intended as an aid in the diagnosis of influenza from Nasopharyngeal swab specimens and should not be used as a sole basis for treatment. Nasal washings and aspirates are unacceptable for Xpert Xpress SARS-CoV-2/FLU/RSV testing.  Fact Sheet for Patients: EntrepreneurPulse.com.au  Fact Sheet for Healthcare Providers: IncredibleEmployment.be  This test is not yet approved or cleared by the [place] FDA and has been authorized for detection and/or diagnosis of SARS-CoV-2 by FDA under an Emergency Use Authorization (EUA). This EUA will remain in effect (meaning this test can be used) for the duration of the COVID-19 declaration under Section 564(b)(1) of the Act, 21 U.S.C. section 360bbb-3(b)(1), unless the authorization is terminated or revoked.  Performed at [facility], [address] [place], [place] [address]   Culture, blood (Routine X 2) w Reflex to ID Panel     Status: None (Preliminary result)   Collection Time: 09/14/20 11:49 PM   Specimen: BLOOD  Result Value Ref Range Status   Specimen Description BLOOD LEFT ANTECUBITAL  Final   Special Requests   Final    BOTTLES DRAWN AEROBIC AND ANAEROBIC Blood Culture adequate volume   Culture   Final    NO GROWTH < 12 HOURS Performed at [facility], [address] [place], [place] [address]    Report Status PENDING  Incomplete  Culture, blood (Routine X 2) w  Reflex to ID Panel     Status: None (Preliminary result)   Collection Time: 09/14/20 11:49 PM   Specimen: BLOOD  Result Value Ref Range Status   Specimen Description BLOOD BLOOD LEFT HAND  Final   Special Requests   Final    BOTTLES DRAWN AEROBIC AND ANAEROBIC Blood Culture adequate volume   Culture   Final    NO GROWTH < 12 HOURS Performed at [facility], [address], [place], [place] [address]    Report Status PENDING  Incomplete     Studies: DG Chest Portable 1 View  Result Date: 09/14/2020 CLINICAL DATA:  Short of breath.  History of COPD EXAM: PORTABLE CHEST 1 VIEW COMPARISON:  09/01/2005 FINDINGS: Extensive airspace disease on the right. Nodular type densities in the left lung, especially the left upper lobe medially. Findings compatible with  pneumonia. Small right effusion. Heart size normal.  Vascularity normal. IMPRESSION: Diffuse bilateral airspace disease right greater than left. Probable pneumonia. Nodular component to the airspace disease on the left possibly due to lung nodules. Follow-up until clearing recommended. Electronically Signed   By: Franchot Gallo M.D.   On: 09/14/2020 15:17    Scheduled Meds: . amantadine  100 mg Oral BID  . chlorproMAZINE  25 mg Oral TID  . divalproex  500 mg Oral BID  . enoxaparin (LOVENOX) injection  40 mg Subcutaneous Q24H  . fluticasone furoate-vilanterol  1 puff Inhalation Daily  . [START ON 09/16/2020] gabapentin  400 mg Oral Daily  . icosapent Ethyl  2 g Oral BID  . LORazepam  0.5 mg Oral QHS  . Memantine HCl-Donepezil HCl  1 capsule Oral Daily  . methylPREDNISolone (SOLU-MEDROL) injection  40 mg Intravenous Daily  . mirtazapine  30 mg Oral QHS  . pantoprazole  40 mg Oral Daily  . QUEtiapine  600 mg Oral QHS  . simvastatin  20 mg Oral QHS  . tamsulosin  0.4 mg Oral QHS  . cyanocobalamin  1,000 mcg Oral Daily   Continuous Infusions: . sodium chloride Stopped (09/15/20 0011)  . sodium chloride 40 mL/hr at 09/15/20  1224  . azithromycin    . cefTRIAXone (ROCEPHIN)  IV      Assessment/Plan:  1. Multifocal nodular pneumonia.  Obtain sputum culture.  Continue Rocephin and Zithromax.  We will get CT scan of the chest for further evaluation. 2. COPD exacerbation start Solu-Medrol.  Continue nebulizer treatments and inhalers. 3. Acute hypoxic respiratory failure with pulse ox of 85% on room air on presentation.  Patient off oxygen at this point so this has resolved. 4. Hyperlipidemia unspecified on simvastatin 5. BPH on Flomax 6. Bipolar and schizoaffective disorder continue psychiatric medications. 7. Generalized weakness physical therapy recommends home with home health.     Code Status:     Code Status Orders  (From admission, onward)         Start     Ordered   09/14/20 2332  Full code  Continuous        09/14/20 2332        Code Status History    Date Active Date Inactive Code Status Order ID Comments User Context   01/02/2018 1723 01/14/2018 1739 Full Code [id number]  Clovis Fredrickson, MD Inpatient   Advance Care Planning Activity     Family Communication: Tried to reach the patient's brother which is to contact in the computer. Disposition Plan: Status is: Inpatient  Dispo: The patient is from: Group home              Anticipated d/c is to: Group home              Anticipated d/c date is: Another day or so in the hospital with IV antibiotics              Patient currently being treated for pneumonia.  Antibiotics:  Rocephin  Zithromax  Time spent: 28 minutes  [place]

## 2020-09-16 ENCOUNTER — Encounter: Payer: Self-pay | Admitting: Internal Medicine

## 2020-09-16 DIAGNOSIS — R918 Other nonspecific abnormal finding of lung field: Secondary | ICD-10-CM

## 2020-09-16 DIAGNOSIS — J189 Pneumonia, unspecified organism: Principal | ICD-10-CM

## 2020-09-16 DIAGNOSIS — N400 Enlarged prostate without lower urinary tract symptoms: Secondary | ICD-10-CM

## 2020-09-16 DIAGNOSIS — C3492 Malignant neoplasm of unspecified part of left bronchus or lung: Secondary | ICD-10-CM

## 2020-09-16 LAB — PROCALCITONIN: Procalcitonin: 0.19 ng/mL

## 2020-09-16 LAB — MRSA PCR SCREENING: MRSA by PCR: NEGATIVE

## 2020-09-16 NOTE — Plan of Care (Signed)
Continuing with plan of care. 

## 2020-09-16 NOTE — Consult Note (Signed)
[place] NOTE  Patient Care Team: Rubye Beach as PCP - General (Family Medicine)  CHIEF COMPLAINTS/PURPOSE OF CONSULTATION: Lung mass/suspected lung cancer  HISTORY OF PRESENTING ILLNESS: Patient is a poor historian given his underlying psychiatric illness Kobie Whidby. 69 y.o.  male patient with history of smoking and schizophrenia/group home resident is currently admitted to hospital for worsening shortness of breath/cough.   On the CT scan hospital patient noted to have multifocal pneumonia; right more than left.  However patient also noted to have left upper lobe lung mass approximately 4 cm in size; bilateral mediastinal neuropathy; bilateral lung nodules.   Currently denies any pain.  Denies any nausea vomiting.  No headaches.  Positive weight loss.  Review of Systems  Constitutional: Positive for malaise/fatigue and weight loss. Negative for chills, diaphoresis and fever.  HENT: Negative for nosebleeds and sore throat.   Eyes: Negative for double vision.  Respiratory: Positive for cough, sputum production and shortness of breath. Negative for hemoptysis and wheezing.   Cardiovascular: Negative for chest pain, palpitations, orthopnea and leg swelling.  Gastrointestinal: Negative for abdominal pain, blood in stool, constipation, diarrhea, heartburn, melena, nausea and vomiting.  Genitourinary: Negative for dysuria, frequency and urgency.  Musculoskeletal: Positive for joint pain. Negative for back pain.  Skin: Negative.  Negative for itching and rash.  Neurological: Negative for dizziness, tingling, focal weakness, weakness and headaches.  Endo/Heme/Allergies: Does not bruise/bleed easily.  Psychiatric/Behavioral: Negative for depression. The patient is not nervous/anxious and does not have insomnia.      MEDICAL HISTORY:  Past Medical History:  Diagnosis Date  . Bipolar 1 disorder ([place])   . Schizo affective schizophrenia [facility])      SURGICAL HISTORY: Past Surgical History:  Procedure Laterality Date  . APPENDECTOMY    . HERNIA REPAIR      SOCIAL HISTORY: Social History   Socioeconomic History  . Marital status: Single    Spouse name: Not on file  . Number of children: Not on file  . Years of education: Not on file  . Highest education level: Not on file  Occupational History  . Not on file  Tobacco Use  . Smoking status: Current Every Day Smoker    Packs/day: 0.20    Types: Cigarettes  . Smokeless tobacco: Never Used  . Tobacco comment: pt smokes 1 cigarette a day  Substance and Sexual Activity  . Alcohol use: No  . Drug use: No  . Sexual activity: Not on file  Other Topics Concern  . Not on file  Social History Narrative   Patient is originally from [place].  He has been in [place] for many years.  He has 4 brothers; Erlene Quan in [place]; another brother [place].  Younger brother is estranged.  Patient is not married no children.  He lives in a group home.  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  Longstanding history of smoking.    Social Determinants of Health   Financial Resource Strain: Not on file  Food Insecurity: Not on file  Transportation Needs: Not on file  Physical Activity: Not on file  Stress: Not on file  Social Connections: Not on file  Intimate Partner Violence: Not on file    FAMILY HISTORY: History reviewed. No pertinent family history.  ALLERGIES:  has No Known Allergies.  MEDICATIONS:  Current Facility-Administered Medications  Medication Dose Route Frequency Provider Last Rate Last Admin  . 0.9 %  sodium chloride  infusion   Intravenous PRN Howerter, Justin B, DO   Stopped at 09/15/20 0011  . acetaminophen (TYLENOL) tablet 650 mg  650 mg Oral Q6H PRN Howerter, Justin B, DO       Or  . acetaminophen (TYLENOL) suppository 650 mg  650 mg Rectal Q6H PRN Howerter, Justin B, DO      . amantadine (SYMMETREL) capsule 100 mg  100 mg Oral BID Howerter, Justin  B, DO   100 mg at 09/16/20 0829  . azithromycin (ZITHROMAX) 500 mg in sodium chloride 0.9 % 250 mL IVPB  500 mg Intravenous Q24H Howerter, Justin B, DO 250 mL/hr at 09/16/20 1637 500 mg at 09/16/20 1637  . cefTRIAXone (ROCEPHIN) 1 g in sodium chloride 0.9 % 100 mL IVPB  1 g Intravenous Q24H Howerter, Justin B, DO   Paused at 09/15/20 1721  . chlorproMAZINE (THORAZINE) tablet 25 mg  25 mg Oral TID Howerter, Justin B, DO   25 mg at 09/16/20 1749  . divalproex (DEPAKOTE) DR tablet 500 mg  500 mg Oral BID Howerter, Justin B, DO   500 mg at 09/16/20 0830  . memantine (NAMENDA XR) 24 hr capsule 28 mg  28 mg Oral Daily Loletha Grayer, MD   28 mg at 09/16/20 1975   And  . donepezil (ARICEPT) tablet 10 mg  10 mg Oral Daily Loletha Grayer, MD   10 mg at 09/16/20 0829  . enoxaparin (LOVENOX) injection 40 mg  40 mg Subcutaneous Q24H Howerter, Justin B, DO   40 mg at 09/16/20 0830  . fluticasone furoate-vilanterol (BREO ELLIPTA) 200-25 MCG/INH 1 puff  1 puff Inhalation Daily Howerter, Justin B, DO   1 puff at 09/16/20 0831  . gabapentin (NEURONTIN) capsule 400 mg  400 mg Oral Daily Loletha Grayer, MD   400 mg at 09/16/20 0829  . icosapent Ethyl (VASCEPA) 1 g capsule 2 g  2 g Oral BID Renda Rolls, RPH   2 g at 09/16/20 1217  . LORazepam (ATIVAN) tablet 0.5 mg  0.5 mg Oral QHS Howerter, Justin B, DO   0.5 mg at 09/15/20 2100  . methylPREDNISolone sodium succinate (SOLU-MEDROL) 40 mg/mL injection 40 mg  40 mg Intravenous Daily Loletha Grayer, MD   40 mg at 09/16/20 0830  . mirtazapine (REMERON) tablet 30 mg  30 mg Oral QHS Loletha Grayer, MD   30 mg at 09/15/20 2100  . pantoprazole (PROTONIX) EC tablet 40 mg  40 mg Oral Daily Howerter, Justin B, DO   40 mg at 09/16/20 0830  . QUEtiapine (SEROQUEL XR) 24 hr tablet 600 mg  600 mg Oral QHS Howerter, Justin B, DO   600 mg at 09/15/20 2100  . simvastatin (ZOCOR) tablet 20 mg  20 mg Oral QHS Howerter, Justin B, DO   20 mg at 09/15/20 2100  . tamsulosin  (FLOMAX) capsule 0.4 mg  0.4 mg Oral QHS Howerter, Justin B, DO   0.4 mg at 09/15/20 2100  . vitamin B-12 (CYANOCOBALAMIN) tablet 1,000 mcg  1,000 mcg Oral Daily Loletha Grayer, MD   1,000 mcg at 09/16/20 0831      .  PHYSICAL EXAMINATION:  Vitals:   09/16/20 0416 09/16/20 1200  BP: (!) 112/56 (!) 98/59  Pulse: 97 90  Resp: 19 16  Temp: 99 F (37.2 C) 98.2 F (36.8 C)  SpO2: 92% 92%   Filed Weights   09/14/20 1411 09/14/20 1954  Weight: 170 lb (77.1 kg) 172 lb 6.4 oz (78.2 kg)  Physical Exam Constitutional:      Comments: Patient looks older than his stated age.  Alone.  No acute distress.  HENT:     Head: Normocephalic and atraumatic.     Mouth/Throat:     Mouth: Oropharynx is clear and moist.     Pharynx: No oropharyngeal exudate.  Eyes:     Pupils: Pupils are equal, round, and reactive to light.  Cardiovascular:     Rate and Rhythm: Normal rate and regular rhythm.  Pulmonary:     Effort: No respiratory distress.     Breath sounds: No wheezing.     Comments: Decreased breath sounds bilaterally. Abdominal:     General: Bowel sounds are normal. There is no distension.     Palpations: Abdomen is soft. There is no mass.     Tenderness: There is no abdominal tenderness. There is no guarding or rebound.  Musculoskeletal:        General: No tenderness or edema. Normal range of motion.     Cervical back: Normal range of motion and neck supple.  Skin:    General: Skin is warm.  Neurological:     Mental Status: He is alert and oriented to person, place, and time.  Psychiatric:        Mood and Affect: Affect normal.      LABORATORY DATA:  I have reviewed the data as listed Lab Results  Component Value Date   WBC 7.6 09/15/2020   HGB 10.5 (L) 09/15/2020   HCT 32.8 (L) 09/15/2020   MCV 91.9 09/15/2020   PLT 221 09/15/2020   Recent Labs    01/02/20 1035 09/14/20 1438 09/15/20 0523  NA 143 140 138  K 4.3 4.0 3.9  CL 104 101 104  CO2 24 26 27   GLUCOSE  124* 135* 94  BUN 13 23 17   CREATININE 1.10 1.04 0.85  CALCIUM 9.3 8.6* 8.3*  GFRNONAA 68 >60 >60  GFRAA 79  --   --   PROT 7.0 6.7 5.6*  ALBUMIN 4.5 2.8* 2.3*  AST 18 20 13*  ALT 15 15 13   ALKPHOS 116 64 53  BILITOT 0.3 0.7 0.5    RADIOGRAPHIC STUDIES: I have personally reviewed the radiological images as listed and agreed with the findings in the report. CT CHEST W CONTRAST  Result Date: 09/15/2020 CLINICAL DATA:  Cough. EXAM: CT CHEST WITH CONTRAST TECHNIQUE: Multidetector CT imaging of the chest was performed during intravenous contrast administration. CONTRAST:  41mL OMNIPAQUE IOHEXOL 300 MG/ML  SOLN COMPARISON:  September 14, 2020. FINDINGS: Cardiovascular: Atherosclerosis of thoracic aorta is noted without aneurysm or dissection. Normal cardiac size. No pericardial effusion. Mediastinum/Nodes: Thyroid gland is unremarkable. Esophagus appears normal. 2.2 cm subcarinal lymph node is noted 2.5 cm precarinal lymph node is noted. 2.4 cm lymph node is noted in aortopulmonary window. Lungs/Pleura: No pneumothorax is noted. 4.1 x 3.0 cm spiculated mass is noted medially in the left upper lobe consistent with malignancy. Multiple pulmonary nodules are noted diffusely and bilaterally consistent with metastatic disease. Emphysematous disease is noted in the lung apices. There is a large area of consolidation involving the right upper and lower lobes bilaterally concerning for postobstructive pneumonia or atelectasis secondary to significant narrowing of the right upper and lower lobe bronchi most likely due to hilar adenopathy. Small right pleural effusion is noted as well. Upper Abdomen: No acute abnormality. Musculoskeletal: No chest wall abnormality. No acute or significant osseous findings. IMPRESSION: 1. 4.1 x 3.0 cm spiculated mass is  noted medially in the left upper lobe consistent with malignancy. 2. Multiple pulmonary nodules are noted diffusely and bilaterally consistent with metastatic  disease. 3. Large area of consolidation is noted involving the right upper and lower lobes bilaterally concerning for postobstructive pneumonia or atelectasis secondary to significant narrowing of the right upper and lower lobe bronchi most likely due to right hilar adenopathy. 4. Small right pleural effusion is noted as well. 5. Enlarged mediastinal lymph nodes are noted consistent with metastatic disease. These results will be called to the ordering clinician or representative by the Radiologist Assistant, and communication documented in the PACS or zVision Dashboard. Aortic Atherosclerosis (ICD10-I70.0) and Emphysema (ICD10-J43.9). Electronically Signed   By: Marijo Conception M.D.   On: 09/15/2020 16:05   DG Chest Portable 1 View  Result Date: 09/14/2020 CLINICAL DATA:  Short of breath.  History of COPD EXAM: PORTABLE CHEST 1 VIEW COMPARISON:  09/01/2005 FINDINGS: Extensive airspace disease on the right. Nodular type densities in the left lung, especially the left upper lobe medially. Findings compatible with pneumonia. Small right effusion. Heart size normal.  Vascularity normal. IMPRESSION: Diffuse bilateral airspace disease right greater than left. Probable pneumonia. Nodular component to the airspace disease on the left possibly due to lung nodules. Follow-up until clearing recommended. Electronically Signed   By: Franchot Gallo M.D.   On: 09/14/2020 15:17    [name] #69 year old male patient with a history of schizophrenia/bipolar group home resident is currently admitted to hospital for worsening shortness of breath/cough; CT scan shows multifocal pneumonia; also left upper lobe mass; mediastinal adenopathy and bilateral lung nodules  #Left upper lobe mass; mediastinal adenopathy; bilateral lung nodules-highly suspicious for lung primary malignancy with metastatic disease to the contralateral lung.   #Multifocal pneumonia-on antibiotics  #Psychiatric illness:  Schizophrenia/bipolar disorder  Recommendation:   #Discussed with the patient regarding the clinical concern for serious malignancy like lung cancer with metastatic disease to contralateral lung.  Discussed that we will need tissue diagnosis to confirm above clinical suspicion.   # Clinically patient appears competent enough to make his own decisions.  However given his baseline schizophrenia/borderline performance status-the context of his serious malignancy/and the need to make significant decisions-I think is reasonable for psychiatry to evaluate for his competency.   #I had a long discussion the patient's brother Brendan/his wife [lives in [place].  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  He understands that stage IV malignancy cannot be cured; but in general would need systemic therapy to prolong life.  He understands patient is at risk for side effects given his borderline performance status/psychiatric illness etc.  Patient's brother also insists involvement of psychiatry to help assess his competency; and treat his significant psychiatric illness.  Discussed the role of palliative care in helping in managing patient's care.   Thank you Dr.Wieting for allowing me to participate in the care of your pleasant patient. Please do not hesitate to contact me with questions or concerns in the interim.  Discussed with Dr. Leslye Peer; and Dr.Aleskerov.    All questions were answered. The patient knows to call the clinic with any problems, questions or concerns.    Cammie Sickle, MD 09/16/2020 6:30 PM

## 2020-09-16 NOTE — Progress Notes (Signed)
Patient ID: Joseph Holt., male   DOB: 1951-06-02, 69 y.o.   MRN: [id number] [facility] PROGRESS NOTE  Joseph Holt. [id number] DOB: Dec 24, 1950 DOA: 09/14/2020 PCP: Mar Daring, PA-C  HPI/Subjective: Patient feels a little bit okay.  Still having some shortness of breath and some cough.  No chest pain.  Still with a little bit of wheeze.  Admitted with pneumonia.  Objective: Vitals:   09/15/20 2054 09/16/20 0416  BP: 107/72 (!) 112/56  Pulse: 95 97  Resp: 19 19  Temp: 98.8 F (37.1 C) 99 F (37.2 C)  SpO2: 92% 92%    Intake/Output Summary (Last 24 hours) at 09/16/2020 1136 Last data filed at 09/16/2020 0900 Gross per 24 hour  Intake 2061.23 ml  Output 700 ml  Net 1361.23 ml   Filed Weights   09/14/20 1411 09/14/20 1954  Weight: 77.1 kg 78.2 kg    ROS: Review of Systems  Respiratory: Positive for cough, shortness of breath and wheezing.   Cardiovascular: Negative for chest pain.  Gastrointestinal: Negative for abdominal pain, nausea and vomiting.   Exam: Physical Exam HENT:     Head: Normocephalic.     Mouth/Throat:     Pharynx: No oropharyngeal exudate.  Eyes:     General: Lids are normal.     Conjunctiva/sclera: Conjunctivae normal.     Pupils: Pupils are equal, round, and reactive to light.  Cardiovascular:     Rate and Rhythm: Normal rate and regular rhythm.     Heart sounds: Normal heart sounds, S1 normal and S2 normal.  Pulmonary:     Breath sounds: Examination of the right-middle field reveals wheezing. Examination of the right-lower field reveals decreased breath sounds and wheezing. Examination of the left-lower field reveals decreased breath sounds and wheezing. Decreased breath sounds and wheezing present. No rhonchi or rales.  Abdominal:     Palpations: Abdomen is soft.     Tenderness: There is no abdominal tenderness.  Musculoskeletal:     Right lower leg: No swelling.     Left lower leg: No swelling.  Skin:     General: Skin is warm.     Findings: No rash.  Neurological:     Mental Status: He is alert.     Comments: Answers questions appropriately.       Data Reviewed: Basic Metabolic Panel: Recent Labs  Lab 09/14/20 1438 09/15/20 0523  NA 140 138  K 4.0 3.9  CL 101 104  CO2 26 27  GLUCOSE 135* 94  BUN 23 17  CREATININE 1.04 0.85  CALCIUM 8.6* 8.3*  MG  --  1.8  PHOS  --  3.5   Liver Function Tests: Recent Labs  Lab 09/14/20 1438 09/15/20 0523  AST 20 13*  ALT 15 13  ALKPHOS 64 53  BILITOT 0.7 0.5  PROT 6.7 5.6*  ALBUMIN 2.8* 2.3*   CBC: Recent Labs  Lab 09/14/20 1438 09/15/20 0523  WBC 8.1 7.6  NEUTROABS  --  5.0  HGB 11.8* 10.5*  HCT 37.7* 32.8*  MCV 92.9 91.9  PLT 220 221   BNP (last 3 results) Recent Labs    09/14/20 1438  BNP 51.7     Recent Results (from the past 240 hour(s))  Resp Panel by RT-PCR (Flu A&B, Covid) Nasopharyngeal Swab     Status: None   Collection Time: 09/14/20  2:22 PM   Specimen: Nasopharyngeal Swab; Nasopharyngeal(NP) swabs in vial transport medium  Result Value Ref Range Status  SARS Coronavirus 2 by RT PCR NEGATIVE NEGATIVE Final    Comment: (NOTE) SARS-CoV-2 target nucleic acids are NOT DETECTED.  The SARS-CoV-2 RNA is generally detectable in upper respiratory specimens during the acute phase of infection. The lowest concentration of SARS-CoV-2 viral copies this assay can detect is 138 copies/mL. A negative result does not preclude SARS-Cov-2 infection and should not be used as the sole basis for treatment or other patient management decisions. A negative result may occur with  improper specimen collection/handling, submission of specimen other than nasopharyngeal swab, presence of viral mutation(s) within the areas targeted by this assay, and inadequate number of viral copies(<138 copies/mL). A negative result must be combined with clinical observations, patient history, and epidemiological information. The  expected result is Negative.  Fact Sheet for Patients:  EntrepreneurPulse.com.au  Fact Sheet for Healthcare Providers:  IncredibleEmployment.be  This test is no t yet approved or cleared by the [place] FDA and  has been authorized for detection and/or diagnosis of SARS-CoV-2 by FDA under an Emergency Use Authorization (EUA). This EUA will remain  in effect (meaning this test can be used) for the duration of the COVID-19 declaration under Section 564(b)(1) of the Act, 21 U.S.C.section 360bbb-3(b)(1), unless the authorization is terminated  or revoked sooner.       Influenza A by PCR NEGATIVE NEGATIVE Final   Influenza B by PCR NEGATIVE NEGATIVE Final    Comment: (NOTE) The Xpert Xpress SARS-CoV-2/FLU/RSV plus assay is intended as an aid in the diagnosis of influenza from Nasopharyngeal swab specimens and should not be used as a sole basis for treatment. Nasal washings and aspirates are unacceptable for Xpert Xpress SARS-CoV-2/FLU/RSV testing.  Fact Sheet for Patients: EntrepreneurPulse.com.au  Fact Sheet for Healthcare Providers: IncredibleEmployment.be  This test is not yet approved or cleared by the [place] FDA and has been authorized for detection and/or diagnosis of SARS-CoV-2 by FDA under an Emergency Use Authorization (EUA). This EUA will remain in effect (meaning this test can be used) for the duration of the COVID-19 declaration under Section 564(b)(1) of the Act, 21 U.S.C. section 360bbb-3(b)(1), unless the authorization is terminated or revoked.  Performed at [facility], [address] [place], [place] [address]   Culture, blood (Routine X 2) w Reflex to ID Panel     Status: None (Preliminary result)   Collection Time: 09/14/20 11:49 PM   Specimen: BLOOD  Result Value Ref Range Status   Specimen Description BLOOD LEFT ANTECUBITAL  Final   Special Requests   Final     BOTTLES DRAWN AEROBIC AND ANAEROBIC Blood Culture adequate volume   Culture   Final    NO GROWTH 1 DAY Performed at [facility], [address], [place], [place] [address]    Report Status PENDING  Incomplete  Culture, blood (Routine X 2) w Reflex to ID Panel     Status: None (Preliminary result)   Collection Time: 09/14/20 11:49 PM   Specimen: BLOOD  Result Value Ref Range Status   Specimen Description BLOOD BLOOD LEFT HAND  Final   Special Requests   Final    BOTTLES DRAWN AEROBIC AND ANAEROBIC Blood Culture adequate volume   Culture   Final    NO GROWTH 1 DAY Performed at [facility], [address] [place], [place] [address]    Report Status PENDING  Incomplete     Studies: CT CHEST W CONTRAST  Result Date: 09/15/2020 CLINICAL DATA:  Cough. EXAM: CT CHEST WITH CONTRAST  TECHNIQUE: Multidetector CT imaging of the chest was performed during intravenous contrast administration. CONTRAST:  75mL OMNIPAQUE IOHEXOL 300 MG/ML  SOLN COMPARISON:  September 14, 2020. FINDINGS: Cardiovascular: Atherosclerosis of thoracic aorta is noted without aneurysm or dissection. Normal cardiac size. No pericardial effusion. Mediastinum/Nodes: Thyroid gland is unremarkable. Esophagus appears normal. 2.2 cm subcarinal lymph node is noted 2.5 cm precarinal lymph node is noted. 2.4 cm lymph node is noted in aortopulmonary window. Lungs/Pleura: No pneumothorax is noted. 4.1 x 3.0 cm spiculated mass is noted medially in the left upper lobe consistent with malignancy. Multiple pulmonary nodules are noted diffusely and bilaterally consistent with metastatic disease. Emphysematous disease is noted in the lung apices. There is a large area of consolidation involving the right upper and lower lobes bilaterally concerning for postobstructive pneumonia or atelectasis secondary to significant narrowing of the right upper and lower lobe bronchi most likely due to hilar adenopathy. Small right pleural  effusion is noted as well. Upper Abdomen: No acute abnormality. Musculoskeletal: No chest wall abnormality. No acute or significant osseous findings. IMPRESSION: 1. 4.1 x 3.0 cm spiculated mass is noted medially in the left upper lobe consistent with malignancy. 2. Multiple pulmonary nodules are noted diffusely and bilaterally consistent with metastatic disease. 3. Large area of consolidation is noted involving the right upper and lower lobes bilaterally concerning for postobstructive pneumonia or atelectasis secondary to significant narrowing of the right upper and lower lobe bronchi most likely due to right hilar adenopathy. 4. Small right pleural effusion is noted as well. 5. Enlarged mediastinal lymph nodes are noted consistent with metastatic disease. These results will be called to the ordering clinician or representative by the Radiologist Assistant, and communication documented in the PACS or zVision Dashboard. Aortic Atherosclerosis (ICD10-I70.0) and Emphysema (ICD10-J43.9). Electronically Signed   By: Marijo Conception M.D.   On: 09/15/2020 16:05   DG Chest Portable 1 View  Result Date: 09/14/2020 CLINICAL DATA:  Short of breath.  History of COPD EXAM: PORTABLE CHEST 1 VIEW COMPARISON:  09/01/2005 FINDINGS: Extensive airspace disease on the right. Nodular type densities in the left lung, especially the left upper lobe medially. Findings compatible with pneumonia. Small right effusion. Heart size normal.  Vascularity normal. IMPRESSION: Diffuse bilateral airspace disease right greater than left. Probable pneumonia. Nodular component to the airspace disease on the left possibly due to lung nodules. Follow-up until clearing recommended. Electronically Signed   By: Franchot Gallo M.D.   On: 09/14/2020 15:17    Scheduled Meds: . amantadine  100 mg Oral BID  . chlorproMAZINE  25 mg Oral TID  . divalproex  500 mg Oral BID  . memantine  28 mg Oral Daily   And  . donepezil  10 mg Oral Daily  .  enoxaparin (LOVENOX) injection  40 mg Subcutaneous Q24H  . fluticasone furoate-vilanterol  1 puff Inhalation Daily  . gabapentin  400 mg Oral Daily  . icosapent Ethyl  2 g Oral BID  . LORazepam  0.5 mg Oral QHS  . methylPREDNISolone (SOLU-MEDROL) injection  40 mg Intravenous Daily  . mirtazapine  30 mg Oral QHS  . pantoprazole  40 mg Oral Daily  . QUEtiapine  600 mg Oral QHS  . simvastatin  20 mg Oral QHS  . tamsulosin  0.4 mg Oral QHS  . cyanocobalamin  1,000 mcg Oral Daily   Continuous Infusions: . sodium chloride Stopped (09/15/20 0011)  . azithromycin 500 mg (09/15/20 1807)  . cefTRIAXone (ROCEPHIN)  IV Stopped (09/15/20 1721)  Assessment/Plan:  1. Large left upper lobe lung mass with multiple nodules and enlarged mediastinal nodes which is consistent with metastatic disease.  Suspect metastatic lung cancer.  Consulted oncology, pulmonary and palliative care.  Pulmonary ordered a thoracentesis to see if we can get cytology from underneath the lung.  Likely will need a biopsy if planning treatment.  Oncology to discuss further with the patient's brother. 2. Multifocal nodular pneumonia.  Sputum culture if able.  Continue Rocephin and Zithromax. 3. COPD exacerbation on Solu-Medrol nebulizer treatments and inhalers 4. Acute hypoxic respiratory failure with pulse ox of 85% on room air on presentation.  Patient was placed back on oxygen last night.  Try to taper oxygen if able to do so. 5. Hyperlipidemia unspecified on simvastatin 6. BPH on Flomax 7. Generalized weakness.  Continue physical therapy while in the hospital. 8. Bipolar and schizoaffective disorder.  Continue psychiatric medication.     Code Status:     Code Status Orders  (From admission, onward)         Start     Ordered   09/14/20 2332  Full code  Continuous        09/14/20 2332        Code Status History    Date Active Date Inactive Code Status Order ID Comments User Context   01/02/2018 1723 01/14/2018  1739 Full Code [id number]  Clovis Fredrickson, MD Inpatient   Advance Care Planning Activity     Family Communication: Spoke with the patient's brother on the phone after persistently calling.  He gave me his cell phone number [phone] Disposition Plan: Status is: Inpatient  Dispo: The patient is from: Group home              Anticipated d/c is to: Group home              Anticipated d/c date is: Earliest potential now likely 09/18/2020              Patient currently found to have a large left lung mass and nodules in the lung.  Suspected to have metastatic lung cancer.  Oncology work-up.  If patient wishes to have treatment we will end up needing a tissue biopsy prior to disposition.  Consultants:  Oncology  Pulmonology  Antibiotics:  Rocephin  Zithromax  Time spent: 28 minutes  [place]

## 2020-09-16 NOTE — Assessment & Plan Note (Addendum)
#  69 year old male patient with a history of schizophrenia/bipolar group home resident is currently admitted to hospital for worsening shortness of breath/cough; CT scan shows multifocal pneumonia; also left upper lobe mass; mediastinal adenopathy and bilateral lung nodules  #Left upper lobe mass; mediastinal adenopathy; bilateral lung nodules-highly suspicious for lung primary malignancy with metastatic disease to the contralateral lung-status post thoracentesis.  Awaiting cytology.  CT scan abdomen pelvis negative for any targettable lesions.  If cytology is negative-recommend alternative procedure like endoscopic bronchial ultrasound with biopsy.  Discussed with Dr.Aleskerov-this could be done outpatient.  #Multifocal pneumonia-on antibiotics  #Psychiatric illness: Schizophrenia/bipolar disorder s/p evaluation with psychiatry-patient deemed competent.  Discussed with Dr. Posey Pronto.

## 2020-09-16 NOTE — Consult Note (Addendum)
Pulmonary Medicine          Date: 09/16/2020,   MRN# [id number] Joseph Holt. 19-Jul-1951     AdmissionWeight: 77.1 kg                 CurrentWeight: 78.2 kg  Referring physician: Dr. Leslye Peer    CHIEF COMPLAINT:   Abnormal chest imaging suggestive of pneumonia versus malignancy   HISTORY OF PRESENT ILLNESS   Is a pleasant 69 year old male he has a history of bipolar disorder as well as schizoaffective disorder, he is a lifelong smoker has been smoking since age 70 and states that for the last 8 weeks has been feeling malaise severe fatigue and overall unwell.  He has stopped smoking completely over the last 8 weeks and states that he has been with cough productive of mild expectorate.  Additionally patient states he had lost approximately 10 pound.  He was admitted to our [facility] service for community-acquired pneumonia and has been treated with typical antimicrobial coverage with Rocephin and Zithromax.  He had CT chest done with findings of a consolidated right lower lobe infiltrate as well as bilateral well-circumscribed nodular disease suggestive of possible metastatic implants and a left upper lobe well-circumscribed spiculated mass.   PAST MEDICAL HISTORY   Past Medical History:  Diagnosis Date  . Bipolar 1 disorder ([place])   . Schizo affective schizophrenia [facility])      SURGICAL HISTORY   Past Surgical History:  Procedure Laterality Date  . APPENDECTOMY    . HERNIA REPAIR       FAMILY HISTORY   History reviewed. No pertinent family history.   SOCIAL HISTORY   Social History   Tobacco Use  . Smoking status: Current Every Day Smoker    Packs/day: 0.20    Types: Cigarettes  . Smokeless tobacco: Never Used  . Tobacco comment: pt smokes 1 cigarette a day  Substance Use Topics  . Alcohol use: No  . Drug use: No     MEDICATIONS    Home Medication:    Current Medication:  Current Facility-Administered Medications:  .  0.9 %  sodium  chloride infusion, , Intravenous, PRN, Howerter, Justin B, DO, Stopped at 09/15/20 0011 .  acetaminophen (TYLENOL) tablet 650 mg, 650 mg, Oral, Q6H PRN **OR** acetaminophen (TYLENOL) suppository 650 mg, 650 mg, Rectal, Q6H PRN, Howerter, Justin B, DO .  amantadine (SYMMETREL) capsule 100 mg, 100 mg, Oral, BID, Howerter, Justin B, DO, 100 mg at 09/16/20 0829 .  azithromycin (ZITHROMAX) 500 mg in sodium chloride 0.9 % 250 mL IVPB, 500 mg, Intravenous, Q24H, Howerter, Justin B, DO, Last Rate: 250 mL/hr at 09/15/20 1807, 500 mg at 09/15/20 1807 .  cefTRIAXone (ROCEPHIN) 1 g in sodium chloride 0.9 % 100 mL IVPB, 1 g, Intravenous, Q24H, Howerter, Justin B, DO, Paused at 09/15/20 1721 .  chlorproMAZINE (THORAZINE) tablet 25 mg, 25 mg, Oral, TID, Howerter, Justin B, DO, 25 mg at 09/16/20 0829 .  divalproex (DEPAKOTE) DR tablet 500 mg, 500 mg, Oral, BID, Howerter, Justin B, DO, 500 mg at 09/16/20 0830 .  memantine (NAMENDA XR) 24 hr capsule 28 mg, 28 mg, Oral, Daily, 28 mg at 09/16/20 0829 **AND** donepezil (ARICEPT) tablet 10 mg, 10 mg, Oral, Daily, Leslye Peer, Richard, MD, 10 mg at 09/16/20 0829 .  enoxaparin (LOVENOX) injection 40 mg, 40 mg, Subcutaneous, Q24H, Howerter, Justin B, DO, 40 mg at 09/16/20 0830 .  fluticasone furoate-vilanterol (BREO ELLIPTA) 200-25 MCG/INH 1 puff, 1 puff, Inhalation,  Daily, Howerter, Justin B, DO, 1 puff at 09/16/20 0831 .  gabapentin (NEURONTIN) capsule 400 mg, 400 mg, Oral, Daily, Leslye Peer, Richard, MD, 400 mg at 09/16/20 0829 .  icosapent Ethyl (VASCEPA) 1 g capsule 2 g, 2 g, Oral, BID, Renda Rolls, RPH, 2 g at 09/15/20 2223 .  LORazepam (ATIVAN) tablet 0.5 mg, 0.5 mg, Oral, QHS, Howerter, Justin B, DO, 0.5 mg at 09/15/20 2100 .  methylPREDNISolone sodium succinate (SOLU-MEDROL) 40 mg/mL injection 40 mg, 40 mg, Intravenous, Daily, Leslye Peer, Richard, MD, 40 mg at 09/16/20 0830 .  mirtazapine (REMERON) tablet 30 mg, 30 mg, Oral, QHS, Wieting, Richard, MD, 30 mg at 09/15/20  2100 .  pantoprazole (PROTONIX) EC tablet 40 mg, 40 mg, Oral, Daily, Howerter, Justin B, DO, 40 mg at 09/16/20 0830 .  QUEtiapine (SEROQUEL XR) 24 hr tablet 600 mg, 600 mg, Oral, QHS, Howerter, Justin B, DO, 600 mg at 09/15/20 2100 .  simvastatin (ZOCOR) tablet 20 mg, 20 mg, Oral, QHS, Howerter, Justin B, DO, 20 mg at 09/15/20 2100 .  tamsulosin (FLOMAX) capsule 0.4 mg, 0.4 mg, Oral, QHS, Howerter, Justin B, DO, 0.4 mg at 09/15/20 2100 .  vitamin B-12 (CYANOCOBALAMIN) tablet 1,000 mcg, 1,000 mcg, Oral, Daily, Leslye Peer, Richard, MD, 1,000 mcg at 09/16/20 0831    ALLERGIES   Patient has no known allergies.     REVIEW OF SYSTEMS    Review of Systems:  Gen:  Denies  fever, sweats, chills weigh loss  HEENT: Denies blurred vision, double vision, ear pain, eye pain, hearing loss, nose bleeds, sore throat Cardiac:  No dizziness, chest pain or heaviness, chest tightness,edema Resp:   Denies cough or sputum porduction, shortness of breath,wheezing, hemoptysis,  Gi: Denies swallowing difficulty, stomach pain, nausea or vomiting, diarrhea, constipation, bowel incontinence Gu:  Denies bladder incontinence, burning urine Ext:   Denies Joint pain, stiffness or swelling Skin: Denies  skin rash, easy bruising or bleeding or hives Endoc:  Denies polyuria, polydipsia , polyphagia or weight change Psych:   Denies depression, insomnia or hallucinations   Other:  All other systems negative   VS: BP (!) 112/56 (BP Location: Right Arm)   Pulse 97   Temp 99 F (37.2 C) (Oral)   Resp 19   Ht 6' (1.829 m)   Wt 78.2 kg   SpO2 92%   BMI 23.38 kg/m      PHYSICAL EXAM    GENERAL:NAD, no fevers, chills, no weakness no fatigue HEAD: Normocephalic, atraumatic.  EYES: Pupils equal, round, reactive to light. Extraocular muscles intact. No scleral icterus.  MOUTH: Moist mucosal membrane. Dentition intact. No abscess noted.  EAR, NOSE, THROAT: Clear without exudates. No external lesions.  NECK:  Supple. No thyromegaly. No nodules. No JVD.  PULMONARY: Diffuse coarse rhonchi right sided +wheezes CARDIOVASCULAR: S1 and S2. Regular rate and rhythm. No murmurs, rubs, or gallops. No edema. Pedal pulses 2+ bilaterally.  GASTROINTESTINAL: Soft, nontender, nondistended. No masses. Positive bowel sounds. No hepatosplenomegaly.  MUSCULOSKELETAL: No swelling, clubbing, or edema. Range of motion full in all extremities.  NEUROLOGIC: Cranial nerves II through XII are intact. No gross focal neurological deficits. Sensation intact. Reflexes intact.  SKIN: No ulceration, lesions, rashes, or cyanosis. Skin warm and dry. Turgor intact.  PSYCHIATRIC: Mood, affect within normal limits. The patient is awake, alert and oriented x 3. Insight, judgment intact.       IMAGING    CT CHEST W CONTRAST  Result Date: 09/15/2020 CLINICAL DATA:  Cough. EXAM: CT CHEST WITH  CONTRAST TECHNIQUE: Multidetector CT imaging of the chest was performed during intravenous contrast administration. CONTRAST:  75mL OMNIPAQUE IOHEXOL 300 MG/ML  SOLN COMPARISON:  September 14, 2020. FINDINGS: Cardiovascular: Atherosclerosis of thoracic aorta is noted without aneurysm or dissection. Normal cardiac size. No pericardial effusion. Mediastinum/Nodes: Thyroid gland is unremarkable. Esophagus appears normal. 2.2 cm subcarinal lymph node is noted 2.5 cm precarinal lymph node is noted. 2.4 cm lymph node is noted in aortopulmonary window. Lungs/Pleura: No pneumothorax is noted. 4.1 x 3.0 cm spiculated mass is noted medially in the left upper lobe consistent with malignancy. Multiple pulmonary nodules are noted diffusely and bilaterally consistent with metastatic disease. Emphysematous disease is noted in the lung apices. There is a large area of consolidation involving the right upper and lower lobes bilaterally concerning for postobstructive pneumonia or atelectasis secondary to significant narrowing of the right upper and lower lobe bronchi most  likely due to hilar adenopathy. Small right pleural effusion is noted as well. Upper Abdomen: No acute abnormality. Musculoskeletal: No chest wall abnormality. No acute or significant osseous findings. IMPRESSION: 1. 4.1 x 3.0 cm spiculated mass is noted medially in the left upper lobe consistent with malignancy. 2. Multiple pulmonary nodules are noted diffusely and bilaterally consistent with metastatic disease. 3. Large area of consolidation is noted involving the right upper and lower lobes bilaterally concerning for postobstructive pneumonia or atelectasis secondary to significant narrowing of the right upper and lower lobe bronchi most likely due to right hilar adenopathy. 4. Small right pleural effusion is noted as well. 5. Enlarged mediastinal lymph nodes are noted consistent with metastatic disease. These results will be called to the ordering clinician or representative by the Radiologist Assistant, and communication documented in the PACS or zVision Dashboard. Aortic Atherosclerosis (ICD10-I70.0) and Emphysema (ICD10-J43.9). Electronically Signed   By: Marijo Conception M.D.   On: 09/15/2020 16:05   DG Chest Portable 1 View  Result Date: 09/14/2020 CLINICAL DATA:  Short of breath.  History of COPD EXAM: PORTABLE CHEST 1 VIEW COMPARISON:  09/01/2005 FINDINGS: Extensive airspace disease on the right. Nodular type densities in the left lung, especially the left upper lobe medially. Findings compatible with pneumonia. Small right effusion. Heart size normal.  Vascularity normal. IMPRESSION: Diffuse bilateral airspace disease right greater than left. Probable pneumonia. Nodular component to the airspace disease on the left possibly due to lung nodules. Follow-up until clearing recommended. Electronically Signed   By: Franchot Gallo M.D.   On: 09/14/2020 15:17      ASSESSMENT/PLAN   Right lower lobe pneumonia - currently on Rocephin and Zithromax Patient with absence of leukocytosis and presence of  constitutional symptoms-as well as imaging with well-circumscribed asymmetric bilateral solid pulmonary nodules suggestive of metastatic implants secondary to malignancy, there is surrounding small pleural effusion I will order diagnostic thoracentesis with cancer work-up as well as Gram stain and culture rarely fungal disease can present this way. -MRSA PCR to de-escalate antibiotics -Calcitonin trend -Continue current antimicrobials for now -Oncology evaluation process appreciate input -If possible to do PET scan while inpatient may allow [place] to get biopsy on least invasive location while patient is in-house.   Left upper lobe spiculated mass -High pretest probability of malignancy with lifelong smoking status recent weight loss and severe undue fatigue pointing towards malignancy, there is associated hilar mediastinal lymphadenopathy which can be reactive or due to metastasis -Thoracentesis ordered -Patient states he is interested in work-up and therapy for cancer -Oncology consultation process appreciate input   Bullous centrilobular  emphysema -Generic COPD care path with duo nebs every 6 hours while awake as well as inhaled corticosteroids -Recommend PT OT as well as incentive spirometer while inpatient   Moderate protein calorie malnutrition -Bitemporal wasting -Decreased albumin level -Peripheral muscle atrophy -May be cancer associated malnourishement -RD consultation recommended     Thank you for allowing me to participate in the care of this patient.   Patient/Family are satisfied with care plan and all questions have been answered.  This document was prepared using Dragon voice recognition software and may include unintentional dictation errors.     Ottie Glazier, M.D.  Division of [place]

## 2020-09-17 ENCOUNTER — Inpatient Hospital Stay: Payer: Medicare Other

## 2020-09-17 DIAGNOSIS — C3491 Malignant neoplasm of unspecified part of right bronchus or lung: Secondary | ICD-10-CM

## 2020-09-17 DIAGNOSIS — Z9889 Other specified postprocedural states: Secondary | ICD-10-CM

## 2020-09-17 DIAGNOSIS — Z515 Encounter for palliative care: Secondary | ICD-10-CM

## 2020-09-17 DIAGNOSIS — Z7189 Other specified counseling: Secondary | ICD-10-CM

## 2020-09-17 DIAGNOSIS — F200 Paranoid schizophrenia: Secondary | ICD-10-CM

## 2020-09-17 LAB — PROCALCITONIN: Procalcitonin: 0.18 ng/mL

## 2020-09-17 NOTE — TOC Initial Note (Addendum)
Transition of Care (TOC) - Initial/Assessment Note    Patient Details  Name: Joseph Holt. MRN: [id number] Date of Birth: 1951-02-23  Transition of Care [facility]) CM/SW Contact:    Candie Chroman, LCSW Phone Number: 09/17/2020, 10:44 AM  Clinical Narrative:  CSW called patient's group home caregiver. She confirmed the name of their facility is [facility]. Address on facesheet is correct. Notified her of home health PT recommendation. He was not receiving home health services prior to admission. [facility] for [facility]. Will discuss with patient today if oriented and make referral. Patient did not use DME prior to admission. Will order RW prior to discharge. The [facility] staff will be able to transport him once discharged.                12:13 pm: Left voicemail for Ms. Bruce. Will review speech therapy recommendations when she calls back to make sure they can meet his needs.  Expected Discharge Plan: Group Home (with home health services.) Barriers to Discharge: Continued Medical Work up   Patient Goals and CMS Choice        Expected Discharge Plan and Services Expected Discharge Plan: Group Home (with home health services.)       Living arrangements for the past 2 months: Group Home                                      Prior Living Arrangements/Services Living arrangements for the past 2 months: Group Home Lives with:: Facility Resident Patient language and need for interpreter reviewed:: Yes Do you feel safe going back to the place where you live?: Yes      Need for Family Participation in Patient Care: Yes (Comment) Care giver support system in place?: Yes (comment)   Criminal Activity/Legal Involvement Pertinent to Current Situation/Hospitalization: No - Comment as needed  Activities of Daily Living Home Assistive Devices/Equipment: None ADL Screening (condition at time of admission) Patient's cognitive ability adequate to safely complete  daily activities?: Yes Is the patient deaf or have difficulty hearing?: No Does the patient have difficulty seeing, even when wearing glasses/contacts?: No Does the patient have difficulty concentrating, remembering, or making decisions?: No Patient able to express need for assistance with ADLs?: Yes Does the patient have difficulty dressing or bathing?: No Independently performs ADLs?: Yes (appropriate for developmental age) Does the patient have difficulty walking or climbing stairs?: No Weakness of Legs: Both Weakness of Arms/Hands: None  Permission Sought/Granted Permission sought to share information with : Facility Sport and exercise psychologist    Share Information with NAME: Chrystie Nose  Permission granted to share info w AGENCY: [facility]  Permission granted to share info w Relationship: Group home caregiver  Permission granted to share info w Contact Information: [phone]  Emotional Assessment Appearance:: Appears stated age     Orientation: : Oriented to Self,Oriented to Place,Oriented to  Time,Oriented to Situation (with memory impairment.) Alcohol / Substance Use: Not Applicable Psych Involvement: No (comment)  Admission diagnosis:  CAP (community acquired pneumonia) [J18.9] Community acquired pneumonia, unspecified laterality [J18.9] Patient Active Problem List   Diagnosis Date Noted  . [name] 09/16/2020  . Metastatic primary lung cancer, left ([place])   . Benign prostatic hyperplasia without lower urinary tract symptoms   . COPD with acute exacerbation ([place])   . Acute respiratory failure with hypoxia (  [place])   . Hyperlipidemia   . Multifocal pneumonia 09/14/2020  . Shortness of breath 09/14/2020  . Generalized weakness 09/14/2020  . GERD (gastroesophageal reflux disease) 09/14/2020  . Mild protein-calorie malnutrition ([place]) 12/06/2018  . Mixed simple and mucopurulent chronic bronchitis ([place]) 12/06/2018  . Sleep disturbance  09/06/2018  . Bipolar 1 disorder ([place]) 04/14/2018  . Schizo affective schizophrenia ([place]) 04/14/2018  . Cognitive impairment 01/06/2018  . Tobacco use disorder 01/02/2018  . Paranoid schizophrenia ([place]) 02/21/2015  . Social maladjustment 02/21/2015   PCP:  Mar Daring, PA-C Pharmacy:   [facility], [address] [address] Phone: [phone] Fax: [facility], [place] [address] [place] [address] Phone: [phone] Fax: [phone]     Social Determinants of Health (SDOH) Interventions    Readmission Risk Interventions No flowsheet data found.

## 2020-09-17 NOTE — Care Management Important Message (Signed)
Important Message  Patient Details  Name: Joseph Holt. MRN: [id number] Date of Birth: [birth year]   Medicare Important Message Given:  Yes     [name] 09/17/2020, 11:48 AM

## 2020-09-17 NOTE — Consult Note (Signed)
Consultation Note Date: 09/17/2020   Patient Name: Joseph Holt.  DOB: 1951/09/30  MRN: [id number]  Age / Sex: 69 y.o., male  PCP: Mar Daring, PA-C Referring Physician: Loletha Grayer, MD  Reason for Consultation: Establishing goals of care   HPI/Patient Profile: 68 y.o. male  with past medical history of bipolar, schizoaffective disorder, chronic tobacco abuse, GERD, HLD, BPH admitted on 09/14/2020 with shortness of breath from group home. CT chest revealed large left upper lobe lung mass with multiple nodules, large mediastinal nodes consistent with metastatic disease. Suspect metastatic lung cancer. Oncology consulted. S/P thoracentesis 12/13. Pathology pending. Psych consult pending for capacity. Palliative medicine consultation for goals of care.   Clinical Assessment and Goals of Care:  I have reviewed medical records, discussed with care team, and met with patient at bedside. Joseph Holt is awake, alert, and oriented x3. He is able to participate in discussion. No complaints.   I introduced Palliative Medicine as specialized medical care for people living with serious illness. It focuses on providing relief from the symptoms and stress of a serious illness. The goal is to improve quality of life for both the patient and the family.  Joseph Holt shares that he lives in a group home and has great support from staff there. He reports that his 4 brothers live in [place] area. No spouse or children.   This morning, patient did seem to have a good understanding that he likely has cancer. We discussed concern with this being advanced due to multiple areas. He shares his desire to pursue oncology workup and options. We discussed treatment being palliative not curative in nature. He states "grin and bear it." Discussed pending pathology to confirm cancer diagnosis.   Patient does not have a documented living  will or POA. Joseph Holt tells me he would wish for his brother's 1) Joseph Holt 2) Joseph Holt and 3) Joseph Holt to be documented POA's. Patient gives me permission to call brother Joseph Holt and provide update and discuss advance directives. Emphasized importance of early discussions regarding his goals/wishes due to concern for advanced cancer.    **This afternoon, spoke with brother, Joseph Holt via telephone to discuss goals of care.   Joseph Holt lives in [place]. He shares that he tries to have minimal contact with Joseph Holt due to the past and his unfortunate psych history. Joseph Holt shares that multiple times when younger, Joseph Holt hiked from [place] to [place] and would randomly arrive on his or other brother's door steps. By that point, he would often be off psych meds, maniac, and requiring hospitalization. Joseph Holt has not given Emmet his address in years because of this. He has tried to keep Kylo 'at a distance.'  Joseph Holt shares that brother Joseph Holt is deceased (which he has told Joseph Holt many times) and brother Joseph Holt is estranged from the family. Joseph Holt is agreeable with updates on Sullivan's condition but does not wish for responsibility of decisions as HCPOA.   Joseph Holt is appreciative of updates from the care team. He has a good understanding that Joseph Holt likely  has advanced cancer, his desire to pursue treatment options, but also outlook is pretty 'grim.'   Discussed pending psych evaluation. Also discussed recommendation to pursue legal guardianship through the state so Joseph Holt has a Tour manager at the point he is incapable of making decisions. Joseph Holt thinks this is a good idea.   Answered questions. PMT contact information given.    SUMMARY OF RECOMMENDATIONS    Continue full code/full scope treatment.  Patient desires oncology workup and treatment if eligible.   Pending psych evaluation. Unsure if patient fully understands complexity of his condition and medical decision making.   Brother, Joseph Holt is  primary contact. Lives in [place]. Joseph Holt is agreeable with updates on Joseph Holt's condition but has no interest in becoming surrogate decision-maker/HCPOA.   Recommend considering legal guardianship through the state. Will notify [facility] team. Brother agreeable.   May benefit from ongoing palliative f/u for support and [place] discussions.   Code Status/Advance Care Planning:  Full code  Symptom Management:   Per attending  Palliative Prophylaxis:   Aspiration, Delirium Protocol, Frequent Pain Assessment, Oral Care and Turn Reposition  Psycho-social/Spiritual:   Desire for further Chaplaincy support: yes  Additional Recommendations: Caregiving  Support/Resources  Prognosis:   Unable to determine  Discharge Planning: Likely back to group home with home health and outpatient palliative     Primary Diagnoses: Present on Admission:  Shortness of breath  Tobacco use disorder  Bipolar 1 disorder (HCC)  GERD (gastroesophageal reflux disease)   I have reviewed the medical record, interviewed the patient and family, and examined the patient. The following aspects are pertinent.  Past Medical History:  Diagnosis Date   Bipolar 1 disorder ([place])    Schizo affective schizophrenia ([place])    Social History   Socioeconomic History   Marital status: Single    Spouse name: Not on file   Number of children: Not on file   Years of education: Not on file   Highest education level: Not on file  Occupational History   Not on file  Tobacco Use   Smoking status: Current Every Day Smoker    Packs/day: 0.20    Types: Cigarettes   Smokeless tobacco: Never Used   Tobacco comment: pt smokes 1 cigarette a day  Substance and Sexual Activity   Alcohol use: No   Drug use: No   Sexual activity: Not on file  Other Topics Concern   Not on file  Social History Narrative   Patient is originally from [place].  He has been in [place] for many years.  He has 4 brothers; Joseph Holt in  [place]; another brother [place].  Younger brother is estranged.  Patient is not married no children.  He lives in a group home.  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  Longstanding history of smoking.    Social Determinants of Health   Financial Resource Strain: Not on file  Food Insecurity: Not on file  Transportation Needs: Not on file  Physical Activity: Not on file  Stress: Not on file  Social Connections: Not on file   History reviewed. No pertinent family history. Scheduled Meds:  amantadine  100 mg Oral BID   chlorproMAZINE  25 mg Oral TID   divalproex  500 mg Oral BID   memantine  28 mg Oral Daily   And   donepezil  10 mg Oral Daily   enoxaparin (LOVENOX) injection  40 mg Subcutaneous Q24H   fluticasone furoate-vilanterol  1 puff Inhalation  Daily   gabapentin  400 mg Oral Daily   icosapent Ethyl  2 g Oral BID   LORazepam  0.5 mg Oral QHS   methylPREDNISolone (SOLU-MEDROL) injection  40 mg Intravenous Daily   mirtazapine  30 mg Oral QHS   pantoprazole  40 mg Oral Daily   QUEtiapine  600 mg Oral QHS   simvastatin  20 mg Oral QHS   tamsulosin  0.4 mg Oral QHS   cyanocobalamin  1,000 mcg Oral Daily   Continuous Infusions:  sodium chloride Stopped (09/15/20 0011)   azithromycin Stopped (09/16/20 1846)   cefTRIAXone (ROCEPHIN)  IV Stopped (09/16/20 1933)   PRN Meds:.sodium chloride, acetaminophen **OR** acetaminophen Medications Prior to Admission:  Prior to Admission medications   Medication Sig Start Date End Date Taking? Authorizing Provider  ADVAIR DISKUS 250-50 MCG/DOSE AEPB INHALE 1 PUFF TWICE DAILY. Patient taking differently: Inhale into the lungs 2 (two) times daily. 11/07/19  Yes Fenton Malling M, PA-C  amantadine (SYMMETREL) 100 MG capsule TAKE 1 CAPSULE BY MOUTH TWICE DAILY. **DO NOT CRUSH** Patient taking differently: Take 100 mg by mouth 2 (two) times daily. 08/14/20  Yes Mar Daring, PA-C  Ascorbic Acid (VITAMIN  C) 1000 MG tablet TAKE ONE TABLET BY MOUTH EVERY DAY Patient taking differently: Take 1,000 mg by mouth daily. 04/27/20  Yes Mar Daring, PA-C  cetirizine (ZYRTEC) 10 MG tablet TAKE (1) TABLET BY MOUTH ONCE DAILY. Patient taking differently: Take 10 mg by mouth daily. 08/13/20  Yes Burnette, Clearnce Sorrel, PA-C  chlorproMAZINE (THORAZINE) 25 MG tablet TAKE 1 TABLET BY MOUTH 3 TIMES DAILY. Patient taking differently: Take 25 mg by mouth 3 (three) times daily. 08/14/20  Yes Mar Daring, PA-C  cyanocobalamin 1000 MCG tablet Take 1 tablet (1,000 mcg total) by mouth daily. 03/04/19  Yes Mar Daring, PA-C  divalproex (DEPAKOTE) 500 MG DR tablet TAKE 1 TABLET BY MOUTH TWICE DAILY. **DO NOT CRUSH** Patient taking differently: Take 500 mg by mouth 2 (two) times daily. 09/15/19  Yes Mar Daring, PA-C  gabapentin (NEURONTIN) 400 MG capsule TAKE 1 TABLET BY MOUTH EVERY MORNING. Patient taking differently: Take 400 mg by mouth daily. 09/15/19  Yes Burnette, Anderson Malta M, PA-C  LORazepam (ATIVAN) 0.5 MG tablet TAKE 1 TABLET BY MOUTH AT BEDTIME. Patient taking differently: Take 0.5 mg by mouth at bedtime. 07/18/20  Yes Burnette, Anderson Malta M, PA-C  meloxicam (MOBIC) 15 MG tablet TAKE (1) TABLET BY MOUTH ONCE DAILY. Patient taking differently: Take 15 mg by mouth daily. 08/13/20  Yes Mar Daring, PA-C  mirtazapine (REMERON) 30 MG tablet TAKE 1 TABLET BY MOUTH AT BEDTIME. Patient taking differently: Take 30 mg by mouth at bedtime. 08/13/20  Yes Burnette, Jennifer M, PA-C  NAMZARIC 28-10 MG CP24 TAKE ONE CAPSULE ONCE A DAY. * DO NOT CRUSH * Patient taking differently: Take 1 capsule by mouth daily. 04/27/20  Yes Fenton Malling M, PA-C  omeprazole (PRILOSEC) 20 MG capsule TAKE ONE CAPSULE BY MOUTH EVERY MORNING *DO NOT CRUSH* Patient taking differently: Take 20 mg by mouth daily. 04/27/20  Yes Fenton Malling M, PA-C  QUEtiapine (SEROQUEL XR) 300 MG 24 hr tablet TAKE 2 TABLETS  BY MOUTH AT BEDTIME. Patient taking differently: Take 600 mg by mouth at bedtime. 08/14/20  Yes Fenton Malling M, PA-C  simvastatin (ZOCOR) 20 MG tablet TAKE 1 TABLET BY MOUTH AT BEDTIME. Patient taking differently: Take 20 mg by mouth at bedtime. 08/13/20  Yes Mar Daring, PA-C  tamsulosin (  FLOMAX) 0.4 MG CAPS capsule TAKE (1) CAPSULE BY MOUTH AT BEDTIME.*DO NOT CRUSH* Patient taking differently: Take 0.4 mg by mouth at bedtime. 01/02/20  Yes Burnette, Jennifer M, PA-C  VASCEPA 1 g capsule TAKE 2 CAPSULES BY MOUTH TWO TIMES A DAY Patient taking differently: Take 2 g by mouth 2 (two) times daily. 04/27/20  Yes Mar Daring, PA-C  amantadine (SYMMETREL) 100 MG capsule TAKE ONE CAPSULE TWICE A DAY; (DO NOT CRUSH) 04/27/20   Mar Daring, PA-C  chlorproMAZINE (THORAZINE) 25 MG tablet TAKE 1 TABLET BY MOUTH 3 TIMES DAILY. 08/17/19   Mar Daring, PA-C  QUEtiapine (SEROQUEL XR) 300 MG 24 hr tablet Take 2 tablets (600 mg total) by mouth at bedtime. 07/25/19   Mar Daring, PA-C   No Known Allergies Review of Systems  All other systems reviewed and are negative.  Physical Exam Vitals and nursing note reviewed.  Constitutional:      General: He is awake.     Appearance: He is ill-appearing.  Pulmonary:     Effort: No tachypnea, accessory muscle usage or respiratory distress.  Skin:    General: Skin is warm and dry.     Coloration: Skin is pale.  Neurological:     Mental Status: He is alert and oriented to person, place, and time.     Comments: Some forgetfulness w/ history of bipolar/schizoaffective disorder  Psychiatric:        Mood and Affect: Mood normal.        Speech: Speech normal.        Behavior: Behavior normal.    Vital Signs: BP 126/82 (BP Location: Left Arm)    Pulse 93    Temp 98 F (36.7 C) (Oral)    Resp 17    Ht 6' (1.829 m)    Wt 76.2 kg    SpO2 96%    BMI 22.78 kg/m  Pain Scale: 0-10   Pain Score: 0-No pain   SpO2: SpO2: 96  % O2 Device:SpO2: 96 % O2 Flow Rate: .O2 Flow Rate (L/min): 2 L/min  IO: Intake/output summary:   Intake/Output Summary (Last 24 hours) at 09/17/2020 1655 Last data filed at 09/17/2020 1545 Gross per 24 hour  Intake 1070 ml  Output 1300 ml  Net -230 ml    LBM: Last BM Date: 09/16/20 Baseline Weight: Weight: 77.1 kg Most recent weight: Weight: 76.2 kg     Palliative Assessment/Data: PPS 50%   Flowsheet Rows   Flowsheet Row Most Recent Value  Intake Tab   Referral Department Hospitalist  Unit at Time of Referral Cardiac/Telemetry Unit  Palliative Care Primary Diagnosis Cancer  Palliative Care Type New Palliative care  Reason for referral Clarify Goals of Care  Date first seen by Palliative Care 09/17/20  Clinical Assessment   Palliative Performance Scale Score 50%  Psychosocial & Spiritual Assessment   Palliative Care Outcomes   Patient/Family meeting held? Yes  Who was at the meeting? patient. Brother Joseph Holt)  Palliative Care Outcomes Clarified goals of care, Provided psychosocial or spiritual support, Linked to palliative care logitudinal support, ACP counseling assistance       Time Total: 60  Greater than 50%  of this time was spent counseling and coordinating care related to the above assessment and plan.  Signed by:  Ihor Dow, DNP, FNP-C Palliative Medicine Team  Phone: [phone] Fax: [phone]   Please contact Palliative Medicine Team phone at [phone] for questions and concerns.  For individual provider: See Shea Evans

## 2020-09-17 NOTE — Progress Notes (Signed)
Pulmonary Medicine          Date: 09/17/2020,   MRN# [id number] Joseph Holt. 1950/10/15     AdmissionWeight: 77.1 kg                 CurrentWeight: 76.2 kg  Referring physician: Dr. Leslye Peer    CHIEF COMPLAINT:   Abnormal chest imaging suggestive of pneumonia versus malignancy   HISTORY OF PRESENT ILLNESS   Is a pleasant 69 year old male he has a history of bipolar disorder as well as schizoaffective disorder, he is a lifelong smoker has been smoking since age 24 and states that for the last 8 weeks has been feeling malaise severe fatigue and overall unwell.  He has stopped smoking completely over the last 8 weeks and states that he has been with cough productive of mild expectorate.  Additionally patient states he had lost approximately 10 pound.  He was admitted to our [facility] service for community-acquired pneumonia and has been treated with typical antimicrobial coverage with Rocephin and Zithromax.  He had CT chest done with findings of a consolidated right lower lobe infiltrate as well as bilateral well-circumscribed nodular disease suggestive of possible metastatic implants and a left upper lobe well-circumscribed spiculated mass.  09/17/20-patient in no apparent distress resting in bed breathing freely on room air.  S/p Thoracentesis with 250 cc straw colored fluid with profile pending. Patient is stable may finish remainder of workup on outpatient from pulmonary perspective    PAST MEDICAL HISTORY   Past Medical History:  Diagnosis Date  . Bipolar 1 disorder ([place])   . Schizo affective schizophrenia [facility])      SURGICAL HISTORY   Past Surgical History:  Procedure Laterality Date  . APPENDECTOMY    . HERNIA REPAIR       FAMILY HISTORY   History reviewed. No pertinent family history.   SOCIAL HISTORY   Social History   Tobacco Use  . Smoking status: Current Every Day Smoker    Packs/day: 0.20    Types: Cigarettes  . Smokeless tobacco:  Never Used  . Tobacco comment: pt smokes 1 cigarette a day  Substance Use Topics  . Alcohol use: No  . Drug use: No     MEDICATIONS    Home Medication:    Current Medication:  Current Facility-Administered Medications:  .  0.9 %  sodium chloride infusion, , Intravenous, PRN, Howerter, Justin B, DO, Stopped at 09/15/20 0011 .  acetaminophen (TYLENOL) tablet 650 mg, 650 mg, Oral, Q6H PRN **OR** acetaminophen (TYLENOL) suppository 650 mg, 650 mg, Rectal, Q6H PRN, Howerter, Justin B, DO .  amantadine (SYMMETREL) capsule 100 mg, 100 mg, Oral, BID, Howerter, Justin B, DO, 100 mg at 09/17/20 0911 .  azithromycin (ZITHROMAX) 500 mg in sodium chloride 0.9 % 250 mL IVPB, 500 mg, Intravenous, Q24H, Howerter, Justin B, DO, Stopped at 09/16/20 1846 .  cefTRIAXone (ROCEPHIN) 1 g in sodium chloride 0.9 % 100 mL IVPB, 1 g, Intravenous, Q24H, Howerter, Justin B, DO, Stopped at 09/16/20 1933 .  chlorproMAZINE (THORAZINE) tablet 25 mg, 25 mg, Oral, TID, Howerter, Justin B, DO, 25 mg at 09/17/20 0912 .  divalproex (DEPAKOTE) DR tablet 500 mg, 500 mg, Oral, BID, Howerter, Justin B, DO, 500 mg at 09/17/20 0912 .  memantine (NAMENDA XR) 24 hr capsule 28 mg, 28 mg, Oral, Daily, 28 mg at 09/17/20 0911 **AND** donepezil (ARICEPT) tablet 10 mg, 10 mg, Oral, Daily, Leslye Peer, Richard, MD, 10 mg at 09/17/20 0912 .  enoxaparin (LOVENOX) injection 40 mg, 40 mg, Subcutaneous, Q24H, Howerter, Justin B, DO, 40 mg at 09/17/20 0912 .  fluticasone furoate-vilanterol (BREO ELLIPTA) 200-25 MCG/INH 1 puff, 1 puff, Inhalation, Daily, Howerter, Justin B, DO, 1 puff at 09/17/20 0915 .  gabapentin (NEURONTIN) capsule 400 mg, 400 mg, Oral, Daily, Leslye Peer, Richard, MD, 400 mg at 09/17/20 0911 .  icosapent Ethyl (VASCEPA) 1 g capsule 2 g, 2 g, Oral, BID, Renda Rolls, RPH, 2 g at 09/17/20 0912 .  LORazepam (ATIVAN) tablet 0.5 mg, 0.5 mg, Oral, QHS, Howerter, Justin B, DO, 0.5 mg at 09/16/20 2122 .  methylPREDNISolone sodium  succinate (SOLU-MEDROL) 40 mg/mL injection 40 mg, 40 mg, Intravenous, Daily, Leslye Peer, Richard, MD, 40 mg at 09/17/20 0915 .  mirtazapine (REMERON) tablet 30 mg, 30 mg, Oral, QHS, Wieting, Richard, MD, 30 mg at 09/16/20 2122 .  pantoprazole (PROTONIX) EC tablet 40 mg, 40 mg, Oral, Daily, Howerter, Justin B, DO, 40 mg at 09/17/20 0911 .  QUEtiapine (SEROQUEL XR) 24 hr tablet 600 mg, 600 mg, Oral, QHS, Howerter, Justin B, DO, 600 mg at 09/16/20 2123 .  simvastatin (ZOCOR) tablet 20 mg, 20 mg, Oral, QHS, Howerter, Justin B, DO, 20 mg at 09/16/20 2123 .  tamsulosin (FLOMAX) capsule 0.4 mg, 0.4 mg, Oral, QHS, Howerter, Justin B, DO, 0.4 mg at 09/16/20 2122 .  vitamin B-12 (CYANOCOBALAMIN) tablet 1,000 mcg, 1,000 mcg, Oral, Daily, Leslye Peer, Richard, MD, 1,000 mcg at 09/17/20 0911    ALLERGIES   Patient has no known allergies.     REVIEW OF SYSTEMS    Review of Systems:  Gen:  Denies  fever, sweats, chills weigh loss  HEENT: Denies blurred vision, double vision, ear pain, eye pain, hearing loss, nose bleeds, sore throat Cardiac:  No dizziness, chest pain or heaviness, chest tightness,edema Resp:   Denies cough or sputum porduction, shortness of breath,wheezing, hemoptysis,  Gi: Denies swallowing difficulty, stomach pain, nausea or vomiting, diarrhea, constipation, bowel incontinence Gu:  Denies bladder incontinence, burning urine Ext:   Denies Joint pain, stiffness or swelling Skin: Denies  skin rash, easy bruising or bleeding or hives Endoc:  Denies polyuria, polydipsia , polyphagia or weight change Psych:   Denies depression, insomnia or hallucinations   Other:  All other systems negative   VS: BP (!) 142/95 (BP Location: Left Arm)   Pulse (!) 106   Temp (!) 97.5 F (36.4 C) (Oral)   Resp 18   Ht 6' (1.829 m)   Wt 76.2 kg   SpO2 95%   BMI 22.78 kg/m      PHYSICAL EXAM    GENERAL:NAD, no fevers, chills, no weakness no fatigue HEAD: Normocephalic, atraumatic.  EYES:  Pupils equal, round, reactive to light. Extraocular muscles intact. No scleral icterus.  MOUTH: Moist mucosal membrane. Dentition intact. No abscess noted.  EAR, NOSE, THROAT: Clear without exudates. No external lesions.  NECK: Supple. No thyromegaly. No nodules. No JVD.  PULMONARY: Diffuse coarse rhonchi right sided +wheezes CARDIOVASCULAR: S1 and S2. Regular rate and rhythm. No murmurs, rubs, or gallops. No edema. Pedal pulses 2+ bilaterally.  GASTROINTESTINAL: Soft, nontender, nondistended. No masses. Positive bowel sounds. No hepatosplenomegaly.  MUSCULOSKELETAL: No swelling, clubbing, or edema. Range of motion full in all extremities.  NEUROLOGIC: Cranial nerves II through XII are intact. No gross focal neurological deficits. Sensation intact. Reflexes intact.  SKIN: No ulceration, lesions, rashes, or cyanosis. Skin warm and dry. Turgor intact.  PSYCHIATRIC: Mood, affect within normal limits. The patient is awake, alert  and oriented x 3. Insight, judgment intact.       IMAGING    CT CHEST W CONTRAST  Result Date: 09/15/2020 CLINICAL DATA:  Cough. EXAM: CT CHEST WITH CONTRAST TECHNIQUE: Multidetector CT imaging of the chest was performed during intravenous contrast administration. CONTRAST:  24mL OMNIPAQUE IOHEXOL 300 MG/ML  SOLN COMPARISON:  September 14, 2020. FINDINGS: Cardiovascular: Atherosclerosis of thoracic aorta is noted without aneurysm or dissection. Normal cardiac size. No pericardial effusion. Mediastinum/Nodes: Thyroid gland is unremarkable. Esophagus appears normal. 2.2 cm subcarinal lymph node is noted 2.5 cm precarinal lymph node is noted. 2.4 cm lymph node is noted in aortopulmonary window. Lungs/Pleura: No pneumothorax is noted. 4.1 x 3.0 cm spiculated mass is noted medially in the left upper lobe consistent with malignancy. Multiple pulmonary nodules are noted diffusely and bilaterally consistent with metastatic disease. Emphysematous disease is noted in the lung apices.  There is a large area of consolidation involving the right upper and lower lobes bilaterally concerning for postobstructive pneumonia or atelectasis secondary to significant narrowing of the right upper and lower lobe bronchi most likely due to hilar adenopathy. Small right pleural effusion is noted as well. Upper Abdomen: No acute abnormality. Musculoskeletal: No chest wall abnormality. No acute or significant osseous findings. IMPRESSION: 1. 4.1 x 3.0 cm spiculated mass is noted medially in the left upper lobe consistent with malignancy. 2. Multiple pulmonary nodules are noted diffusely and bilaterally consistent with metastatic disease. 3. Large area of consolidation is noted involving the right upper and lower lobes bilaterally concerning for postobstructive pneumonia or atelectasis secondary to significant narrowing of the right upper and lower lobe bronchi most likely due to right hilar adenopathy. 4. Small right pleural effusion is noted as well. 5. Enlarged mediastinal lymph nodes are noted consistent with metastatic disease. These results will be called to the ordering clinician or representative by the Radiologist Assistant, and communication documented in the PACS or zVision Dashboard. Aortic Atherosclerosis (ICD10-I70.0) and Emphysema (ICD10-J43.9). Electronically Signed   By: Marijo Conception M.D.   On: 09/15/2020 16:05   DG Chest Port 1 View  Result Date: 09/17/2020 CLINICAL DATA:  Status post right thoracentesis. EXAM: PORTABLE CHEST 1 VIEW COMPARISON:  09/14/2020. FINDINGS: 1204 hours. Stable asymmetric elevation right hemidiaphragm. Bullous change noted right lung apex. Relatively diffuse patchy airspace disease again noted right lung with nodular opacities in the left lung better characterized on recent chest CT. Medial/suprahilar left upper lobe pulmonary mass lesion evident. No substantial left pleural effusion. Cardiopericardial silhouette is at upper limits of normal for size. The  visualized bony structures of the thorax show no acute abnormality. IMPRESSION: 1. No pneumothorax status post right thoracentesis. 2. Persistent patchy airspace disease right lung with left lung nodules better characterized on recent chest CT. The right pulmonary nodule seen on previous chest CT are largely obscured by the airspace disease on today's x-ray. Electronically Signed   By: Misty Stanley M.D.   On: 09/17/2020 12:29   DG Chest Portable 1 View  Result Date: 09/14/2020 CLINICAL DATA:  Short of breath.  History of COPD EXAM: PORTABLE CHEST 1 VIEW COMPARISON:  09/01/2005 FINDINGS: Extensive airspace disease on the right. Nodular type densities in the left lung, especially the left upper lobe medially. Findings compatible with pneumonia. Small right effusion. Heart size normal.  Vascularity normal. IMPRESSION: Diffuse bilateral airspace disease right greater than left. Probable pneumonia. Nodular component to the airspace disease on the left possibly due to lung nodules. Follow-up until clearing recommended. Electronically  Signed   By: Franchot Gallo M.D.   On: 09/14/2020 15:17   US THORACENTESIS ASP PLEURAL SPACE W/IMG GUIDE  Result Date: 09/17/2020 INDICATION: Patient with history of smoking with worsening shortness of breath and cough found to have a right sided pleural effusion. Request is for diagnostic right sided thoracentesis. EXAM: ULTRASOUND GUIDED RIGHT SIDED DIAGNOSTIC THORACENTESIS MEDICATIONS: Lidocaine 1% 10 mL COMPLICATIONS: None immediate. PROCEDURE: An ultrasound guided thoracentesis was thoroughly discussed with the patient and questions answered. The benefits, risks, alternatives and complications were also discussed. The patient understands and wishes to proceed with the procedure. Written consent was obtained. Ultrasound was performed to localize and mark an adequate pocket of fluid in the right chest. The area was then prepped and draped in the normal sterile fashion. 1%  Lidocaine was used for local anesthesia. Under ultrasound guidance a 6 Fr Safe-T-Centesis catheter was introduced. Thoracentesis was performed. The catheter was removed and a dressing applied. FINDINGS: A total of approximately 250 mL of straw-colored fluid was removed. Samples were sent to the laboratory as requested by the clinical team. IMPRESSION: Successful ultrasound guided diagnostic right-sided thoracentesis yielding 250 mL of pleural fluid. Read by: Rushie Nyhan, NP Electronically Signed   By: Markus Daft M.D.   On: 09/17/2020 12:42      ASSESSMENT/PLAN   Right lower lobe pneumonia Patient with absence of leukocytosis and presence of constitutional symptoms-as well as imaging with well-circumscribed asymmetric bilateral solid pulmonary nodules suggestive of metastatic implants secondary to malignancy, there is surrounding small pleural effusion I will order diagnostic thoracentesis with cancer work-up as well as Gram stain and culture rarely fungal disease can present this way. -MRSA PCR to de-escalate antibiotics -Calcitonin trend -Oncology evaluation process appreciate input -If possible to do PET scan while inpatient may allow [place] to get biopsy on least invasive location while patient is in-house. -procalcitonin very low - consider d/c antibiotics - currently on zithromax rocephin  Left upper lobe spiculated mass -High pretest probability of malignancy with lifelong smoking status recent weight loss and severe undue fatigue pointing towards malignancy, there is associated hilar mediastinal lymphadenopathy which can be reactive or due to metastasis -Thoracentesis done - studies pending -Patient states he is interested in work-up and therapy for cancer -Oncology consultation -appreciate input   Bullous centrilobular emphysema -Generic COPD care path with duo nebs every 6 hours while awake as well as inhaled corticosteroids -Recommend PT OT as well as incentive spirometer while  inpatient   Moderate protein calorie malnutrition -Bitemporal wasting -Decreased albumin level -Peripheral muscle atrophy -May be cancer associated malnourishement -RD consultation recommended     Thank you for allowing me to participate in the care of this patient.   Patient/Family are satisfied with care plan and all questions have been answered.  This document was prepared using Dragon voice recognition software and may include unintentional dictation errors.     Ottie Glazier, M.D.  Division of [place]

## 2020-09-17 NOTE — Evaluation (Signed)
Clinical/Bedside Swallow Evaluation Patient Details  Name: Joseph Holt. MRN: [id number] Date of Birth: [birth year]  Today's Date: 09/17/2020 Time: SLP Start Time (ACUTE ONLY): 1500 SLP Stop Time (ACUTE ONLY): 1115 SLP Time Calculation (min) (ACUTE ONLY): 1215 min  Past Medical History:  Past Medical History:  Diagnosis Date  . Bipolar 1 disorder ([place])   . Schizo affective schizophrenia [facility])    Past Surgical History:  Past Surgical History:  Procedure Laterality Date  . APPENDECTOMY    . HERNIA REPAIR     HPI:  Per sdmitting H&P "Joseph Holt. is a 69 y.o. male with medical history significant for bipolar, chronic tobacco abuse, GERD, hyperlipidemia, BPH, who is admitted to [facility] on 09/14/2020 with community-acquired pneumonia after presenting from group home to [facility] Emergency Department complaining of shortness of breath.      The patient reports 3 to 4 days of progressive shortness of breath associated with productive cough.  He denies any associated hemoptysis, orthopnea, PND, or peripheral edema.  He also denies any associated subjective fever, chills, rigors, or generalized myalgias.  Not associate with any neck stiffness, rhinitis, rhinorrhea, sore throat, wheezing, nausea, vomiting, abdominal pain, diarrhea, or rash.  No recent traveling or known COVID-19 exposures.  He denies any dysuria, gross hematuria, or change in urinary urgency/frequency.  Denies any recent chest pain, diaphoresis, or palpitations."   Assessment / Plan / Recommendation Clinical Impression  Bedside swallow eval revealed inconsistent overt s/s of aspiration with thin liquids. Pt was pleasant and cooperative throughout assessment. Oral mech exam revealed structures to be functioning adequately but Pt had very few teeth in very poor condition. Pt tolerated nectar thick liquids and purees well. Given thin liquids, Pt tolerated the first several sips well but then had an  immediate coughing spell after the last sip from a straw. Pt needed several minutes to continue coughing prior to being able to produce voicing. Pt was able to chew solids but needed extended time to prepare t5he bolus for a swallow secondary to minimal dentition. At this time, will alter diet to Dys 2 chopped with nectar thick liquids. St to follow and provide trials of thin liquids with use of compensatory strategies to determine if this was an isolated incident or related to the straw drinking. May benefit from MBSS to further assess. SLP Visit Diagnosis: Dysphagia, oropharyngeal phase (R13.12)    Aspiration Risk  Mild aspiration risk;Moderate aspiration risk    Diet Recommendation Dysphagia 2 (Fine chop);Nectar-thick liquid   Medication Administration: Whole meds with puree Compensations: Slow rate;Small sips/bites Postural Changes: Seated upright at 90 degrees;Remain upright for at least 30 minutes after po intake    Other  Recommendations     Follow up Recommendations Skilled Nursing facility      Frequency and Duration min 2x/week  1 week       Prognosis Prognosis for Safe Diet Advancement: Guarded      Swallow Study   General Date of Onset: 09/14/20 HPI: Per sdmitting H&P "Joseph Holt. is a 69 y.o. male with medical history significant for bipolar, chronic tobacco abuse, GERD, hyperlipidemia, BPH, who is admitted to [facility] on 09/14/2020 with community-acquired pneumonia after presenting from group home to [facility] Emergency Department complaining of shortness of breath.      The patient reports 3 to 4 days of progressive shortness of breath associated with productive cough.  He denies any associated hemoptysis, orthopnea, PND, or peripheral edema.  He also denies any associated subjective fever, chills, rigors, or generalized myalgias.  Not associate with any neck stiffness, rhinitis, rhinorrhea, sore throat, wheezing, nausea, vomiting, abdominal  pain, diarrhea, or rash.  No recent traveling or known COVID-19 exposures.  He denies any dysuria, gross hematuria, or change in urinary urgency/frequency.  Denies any recent chest pain, diaphoresis, or palpitations." Type of Study: Bedside Swallow Evaluation Diet Prior to this Study: Regular Temperature Spikes Noted: No Respiratory Status: Nasal cannula History of Recent Intubation: No Behavior/Cognition: Alert;Cooperative;Pleasant mood Oral Cavity Assessment: Within Functional Limits Oral Care Completed by SLP: No Oral Cavity - Dentition: Edentulous;Other (Comment) (One or tow teeth in poor condition) Vision: Functional for self-feeding Self-Feeding Abilities: Able to feed self Patient Positioning: Upright in bed Baseline Vocal Quality: Normal Volitional Cough: Weak    Oral/Motor/Sensory Function Overall Oral Motor/Sensory Function: Within functional limits   Ice Chips Ice chips: Within functional limits Presentation: Spoon   Thin Liquid Thin Liquid: Impaired Presentation: Cup;Spoon;Straw Pharyngeal  Phase Impairments: Cough - Immediate    Nectar Thick Nectar Thick Liquid: Within functional limits Presentation: Cup   Honey Thick Honey Thick Liquid: Not tested   Puree Puree: Within functional limits Presentation: Spoon   Solid     Solid: Impaired Presentation: Self Fed Oral Phase Impairments: Impaired mastication Oral Phase Functional Implications: Prolonged oral transit;Impaired mastication      Joseph Holt 09/17/2020,11:22 AM

## 2020-09-17 NOTE — Progress Notes (Signed)
Joseph Holt.   DOB:23-Oct-1950   LK#:[id number]    Subjective: No acute events overnight.  Complains of shortness of breath on exertion.  Mild cough.  No dizzy spells no falls.  Appetite is fair.  Objective:  Vitals:   09/17/20 1620 09/17/20 2000  BP: 126/82 100/76  Pulse: 93 99  Resp: 17 20  Temp: 98 F (36.7 C) 98.1 F (36.7 C)  SpO2: 96% 92%     Intake/Output Summary (Last 24 hours) at 09/17/2020 2022 Last data filed at 09/17/2020 1855 Gross per 24 hour  Intake 1291.6 ml  Output 1300 ml  Net -8.4 ml    Physical Exam Constitutional:      Comments: Frail-appearing Caucasian male Holt.  Resting in the bed.  No acute distress.  HENT:     Head: Normocephalic and atraumatic.     Mouth/Throat:     Mouth: Oropharynx is clear and moist.     Pharynx: No oropharyngeal exudate.  Eyes:     Pupils: Pupils are equal, round, and reactive to light.  Cardiovascular:     Rate and Rhythm: Normal rate and regular rhythm.  Pulmonary:     Effort: No respiratory distress.     Breath sounds: No wheezing.     Comments: Decreased breath sounds bilaterally right more than left. Abdominal:     General: Bowel sounds are normal. There is no distension.     Palpations: Abdomen is soft. There is no Joseph.     Tenderness: There is no abdominal tenderness. There is no guarding or rebound.  Musculoskeletal:        General: No tenderness or edema. Normal range of motion.     Cervical back: Normal range of motion and neck supple.  Skin:    General: Skin is warm.  Neurological:     Mental Status: He is alert and oriented to person, place, and time.  Psychiatric:        Mood and Affect: Affect normal.      Labs:  Lab Results  Component Value Date   WBC 7.6 09/15/2020   HGB 10.5 (L) 09/15/2020   HCT 32.8 (L) 09/15/2020   MCV 91.9 09/15/2020   PLT 221 09/15/2020   NEUTROABS 5.0 09/15/2020    Lab Results  Component Value Date   NA 138 09/15/2020   K 3.9 09/15/2020   CL 104  09/15/2020   CO2 27 09/15/2020    Studies:  DG Chest Port 1 View  Result Date: 09/17/2020 CLINICAL DATA:  Status post right thoracentesis. EXAM: PORTABLE CHEST 1 VIEW COMPARISON:  09/14/2020. FINDINGS: 1204 hours. Stable asymmetric elevation right hemidiaphragm. Bullous change noted right Holt apex. Relatively diffuse patchy airspace disease again noted right Holt with nodular opacities in the left Holt better characterized on recent chest CT. Medial/suprahilar left upper lobe pulmonary Joseph lesion evident. No substantial left pleural effusion. Cardiopericardial silhouette is at upper limits of normal for size. The visualized bony structures of the thorax show no acute abnormality. IMPRESSION: 1. No pneumothorax status post right thoracentesis. 2. Persistent patchy airspace disease right Holt with left Holt nodules better characterized on recent chest CT. The right pulmonary nodule seen on previous chest CT are largely obscured by the airspace disease on today's x-ray. Electronically Signed   By: Misty Stanley M.D.   On: 09/17/2020 12:29   US THORACENTESIS ASP PLEURAL SPACE W/IMG GUIDE  Result Date: 09/17/2020 INDICATION: Holt with history of smoking with worsening shortness of breath and cough found  to have a right sided pleural effusion. Request is for diagnostic right sided thoracentesis. EXAM: ULTRASOUND GUIDED RIGHT SIDED DIAGNOSTIC THORACENTESIS MEDICATIONS: Lidocaine 1% 10 mL COMPLICATIONS: None immediate. PROCEDURE: An ultrasound guided thoracentesis was thoroughly discussed with the Holt and questions answered. The benefits, risks, alternatives and complications were also discussed. The Holt understands and wishes to proceed with the procedure. Written consent was obtained. Ultrasound was performed to localize and mark an adequate pocket of fluid in the right chest. The area was then prepped and draped in the normal sterile fashion. 1% Lidocaine was used for local anesthesia. Under  ultrasound guidance a 6 Fr Safe-T-Centesis catheter was introduced. Thoracentesis was performed. The catheter was removed and a dressing applied. FINDINGS: A total of approximately 250 mL of straw-colored fluid was removed. Samples were sent to the laboratory as requested by the clinical team. IMPRESSION: Successful ultrasound guided diagnostic right-sided thoracentesis yielding 250 mL of pleural fluid. Read by: Rushie Nyhan, NP Electronically Signed   By: Markus Daft M.D.   On: 09/17/2020 12:42    Joseph Holt #69 year old male Holt with a history of schizophrenia/bipolar group home resident is currently admitted to hospital for worsening shortness of breath/cough; CT scan shows multifocal pneumonia; also left upper lobe Joseph; mediastinal adenopathy and bilateral Holt nodules  #Left upper lobe Joseph; mediastinal adenopathy; bilateral Holt nodules-highly suspicious for Holt primary malignancy with metastatic disease to the contralateral Holt-status post thoracentesis.  Awaiting cytology.  Recommend CT abdomen pelvis for staging purposes; also to reevaluate site for biopsy if cytology negative.  Holt seemed to understand implications of the imaging findings concerning for malignancy and also need for systemic therapy which could put him at reasonably high risk of treatment-related complications.  #Multifocal pneumonia-on antibiotics  #Psychiatric illness: Schizophrenia/bipolar disorder s/p evaluation with psychiatry-Holt deemed competent.  Discussed with Dr. Leslye Peer.    Joseph Sickle, MD 09/17/2020  8:22 PM

## 2020-09-17 NOTE — Consult Note (Signed)
[facility] Face-to-Face Psychiatry Consult   Reason for Consult: Consult for this 69 year old man with schizophrenia currently in the hospital for what is likely to turn out to be lung cancer Referring Physician:  Leslye Peer Patient Identification: Joseph Holt. MRN:  [id number] Principal Diagnosis: Paranoid schizophrenia ([place]) Diagnosis:  Principal Problem:   Paranoid schizophrenia ([place]) Active Problems:   Tobacco use disorder   Bipolar 1 disorder ([place])   Multifocal pneumonia   Shortness of breath   Generalized weakness   GERD (gastroesophageal reflux disease)   COPD with acute exacerbation (HCC)   Acute respiratory failure with hypoxia (HCC)   Hyperlipidemia   [name]   Malignant neoplasm of right lung (HCC)   Benign prostatic hyperplasia without lower urinary tract symptoms   S/P thoracentesis   Palliative care by specialist   Goals of care, counseling/discussion   Total Time spent with patient: 1 hour  Subjective:   Joseph Holt. is a 69 y.o. male patient admitted with "things that they are concerned might be cancer".  HPI: Patient seen chart reviewed.  Concern expressed about possibility for capacity evaluation.  This is a 69 year old man with chronic schizophrenia residing in a group home who came to the hospital with physical problems that are probably going to turn out to be lung cancer.  Scan found multiple findings on the chest.  Thoracentesis has been performed already.  Patient was very pleasant and cooperative.  Recognized me immediately.  Pleasant during the interview.  Joseph Holt was able to tell me that they had found things on scans that might be cancer.  Joseph Holt was also able to tell me that Joseph Holt had had fluid drained from his chest today and that it made him feel significantly better.  Joseph Holt reports that Joseph Holt had been feeling sick week with malaise and more recently cough probably for a couple months which is what led him to come to the hospital.  Patient  told me Joseph Holt was aware that there was a plan to do work-up of his disease.  Joseph Holt told me that doctors a told him they wanted to do further tests and evaluation to determine whether Joseph Holt would be an appropriate candidate for cancer treatment such as chemotherapy.  Patient was able to identify knowing what a biopsy meant.  Joseph Holt reports that his mood is been fine.  Denies that Joseph Holt is been having any hallucinations.  Denies any suicidal thought. Past Psychiatric History: Patient has a long history of schizophrenia and has resided in a group home for a long time.  We have seen him a few times over the years and has had previous hospitalizations but no previous suicide attempts no history of violence generally compliant with and tolerates his medication well.  Risk to Self:   Risk to Others:   Prior Inpatient Therapy:   Prior Outpatient Therapy:    Past Medical History:  Past Medical History:  Diagnosis Date  . Bipolar 1 disorder ([place])   . Schizo affective schizophrenia [facility])     Past Surgical History:  Procedure Laterality Date  . APPENDECTOMY    . HERNIA REPAIR     Family History: History reviewed. No pertinent family history. Family Psychiatric  History: None reported Social History:  Social History   Substance and Sexual Activity  Alcohol Use No     Social History   Substance and Sexual Activity  Drug Use No    Social History   Socioeconomic History  .  Marital status: Single    Spouse name: Not on file  . Number of children: Not on file  . Years of education: Not on file  . Highest education level: Not on file  Occupational History  . Not on file  Tobacco Use  . Smoking status: Current Every Day Smoker    Packs/day: 0.20    Types: Cigarettes  . Smokeless tobacco: Never Used  . Tobacco comment: pt smokes 1 cigarette a day  Substance and Sexual Activity  . Alcohol use: No  . Drug use: No  . Sexual activity: Not on file  Other Topics Concern  . Not on file  Social History  Narrative   Patient is originally from [place].  Joseph Holt has been in [place] for many years.  Joseph Holt has 4 brothers; Erlene Quan in [place]; another brother [place].  Younger brother is estranged.  Patient is not married no children.  Joseph Holt lives in a group home.  Many years ago Joseph Holt used to work in [employer]ical [employer].  Longstanding history of smoking.    Social Determinants of Health   Financial Resource Strain: Not on file  Food Insecurity: Not on file  Transportation Needs: Not on file  Physical Activity: Not on file  Stress: Not on file  Social Connections: Not on file   Additional Social History:    Allergies:  No Known Allergies  Labs:  Results for orders placed or performed during the hospital encounter of 09/14/20 (from the past 48 hour(s))  MRSA PCR Screening     Status: None   Collection Time: 09/16/20 12:15 PM   Specimen: Nasopharyngeal  Result Value Ref Range   MRSA by PCR NEGATIVE NEGATIVE    Comment:        The GeneXpert MRSA Assay (FDA approved for NASAL specimens only), is one component of a comprehensive MRSA colonization surveillance program. It is not intended to diagnose MRSA infection nor to guide or monitor treatment for MRSA infections. Performed at [facility], [address] [place], [place] [address]   Procalcitonin     Status: None   Collection Time: 09/17/20  5:30 AM  Result Value Ref Range   Procalcitonin 0.18 ng/mL    Comment:        Interpretation: PCT (Procalcitonin) <= 0.5 ng/mL: Systemic infection (sepsis) is not likely. Local bacterial infection is possible. (NOTE)       Sepsis PCT Algorithm           Lower Respiratory Tract                                      Infection PCT Algorithm    ----------------------------     ----------------------------         PCT < 0.25 ng/mL                PCT < 0.10 ng/mL          Strongly encourage             Strongly discourage   discontinuation of antibiotics    initiation of antibiotics     ----------------------------     -----------------------------       PCT 0.25 - 0.50 ng/mL            PCT 0.10 - 0.25 ng/mL               OR       >  80% decrease in PCT            Discourage initiation of                                            antibiotics      Encourage discontinuation           of antibiotics    ----------------------------     -----------------------------         PCT >= 0.50 ng/mL              PCT 0.26 - 0.50 ng/mL               AND        <80% decrease in PCT             Encourage initiation of                                             antibiotics       Encourage continuation           of antibiotics    ----------------------------     -----------------------------        PCT >= 0.50 ng/mL                  PCT > 0.50 ng/mL               AND         increase in PCT                  Strongly encourage                                      initiation of antibiotics    Strongly encourage escalation           of antibiotics                                     -----------------------------                                           PCT <= 0.25 ng/mL                                                 OR                                        > 80% decrease in PCT                                      Discontinue / Do not initiate  antibiotics  Performed at [facility], [address], [place], [place] [address]     Current Facility-Administered Medications  Medication Dose Route Frequency Provider Last Rate Last Admin  . 0.9 %  sodium chloride infusion   Intravenous PRN Howerter, Justin B, DO   Stopped at 09/15/20 0011  . acetaminophen (TYLENOL) tablet 650 mg  650 mg Oral Q6H PRN Howerter, Justin B, DO       Or  . acetaminophen (TYLENOL) suppository 650 mg  650 mg Rectal Q6H PRN Howerter, Justin B, DO      . amantadine (SYMMETREL) capsule 100 mg  100 mg Oral BID Howerter, Justin B, DO   100 mg at 09/17/20 0911   . azithromycin (ZITHROMAX) 500 mg in sodium chloride 0.9 % 250 mL IVPB  500 mg Intravenous Q24H Howerter, Justin B, DO   Stopped at 09/16/20 1846  . cefTRIAXone (ROCEPHIN) 1 g in sodium chloride 0.9 % 100 mL IVPB  1 g Intravenous Q24H Howerter, Justin B, DO   Stopped at 09/16/20 1933  . chlorproMAZINE (THORAZINE) tablet 25 mg  25 mg Oral TID Howerter, Justin B, DO   25 mg at 09/17/20 0912  . divalproex (DEPAKOTE) DR tablet 500 mg  500 mg Oral BID Howerter, Justin B, DO   500 mg at 09/17/20 0912  . memantine (NAMENDA XR) 24 hr capsule 28 mg  28 mg Oral Daily Loletha Grayer, MD   28 mg at 09/17/20 5400   And  . donepezil (ARICEPT) tablet 10 mg  10 mg Oral Daily Loletha Grayer, MD   10 mg at 09/17/20 0912  . enoxaparin (LOVENOX) injection 40 mg  40 mg Subcutaneous Q24H Howerter, Justin B, DO   40 mg at 09/17/20 0912  . fluticasone furoate-vilanterol (BREO ELLIPTA) 200-25 MCG/INH 1 puff  1 puff Inhalation Daily Howerter, Justin B, DO   1 puff at 09/17/20 0915  . gabapentin (NEURONTIN) capsule 400 mg  400 mg Oral Daily Loletha Grayer, MD   400 mg at 09/17/20 0911  . icosapent Ethyl (VASCEPA) 1 g capsule 2 g  2 g Oral BID Renda Rolls, RPH   2 g at 09/17/20 8676  . LORazepam (ATIVAN) tablet 0.5 mg  0.5 mg Oral QHS Howerter, Justin B, DO   0.5 mg at 09/16/20 2122  . methylPREDNISolone sodium succinate (SOLU-MEDROL) 40 mg/mL injection 40 mg  40 mg Intravenous Daily Loletha Grayer, MD   40 mg at 09/17/20 0915  . mirtazapine (REMERON) tablet 30 mg  30 mg Oral QHS Loletha Grayer, MD   30 mg at 09/16/20 2122  . pantoprazole (PROTONIX) EC tablet 40 mg  40 mg Oral Daily Howerter, Justin B, DO   40 mg at 09/17/20 0911  . QUEtiapine (SEROQUEL XR) 24 hr tablet 600 mg  600 mg Oral QHS Howerter, Justin B, DO   600 mg at 09/16/20 2123  . simvastatin (ZOCOR) tablet 20 mg  20 mg Oral QHS Howerter, Justin B, DO   20 mg at 09/16/20 2123  . tamsulosin (FLOMAX) capsule 0.4 mg  0.4 mg Oral QHS Howerter, Justin  B, DO   0.4 mg at 09/16/20 2122  . vitamin B-12 (CYANOCOBALAMIN) tablet 1,000 mcg  1,000 mcg Oral Daily Loletha Grayer, MD   1,000 mcg at 09/17/20 0911    Musculoskeletal: Strength & Muscle Tone: decreased Gait & Station: normal Patient leans: N/A  Psychiatric Specialty Exam: Physical Exam Vitals and nursing note reviewed.  Constitutional:      Appearance:  Joseph Holt is well-developed and well-nourished.  HENT:     Head: Normocephalic and atraumatic.  Eyes:     Conjunctiva/sclera: Conjunctivae normal.     Pupils: Pupils are equal, round, and reactive to light.  Cardiovascular:     Heart sounds: Normal heart sounds.  Pulmonary:     Effort: Pulmonary effort is normal.  Abdominal:     Palpations: Abdomen is soft.  Musculoskeletal:        General: Normal range of motion.     Cervical back: Normal range of motion.  Skin:    General: Skin is warm and dry.  Neurological:     General: No focal deficit present.     Mental Status: Joseph Holt is alert.  Psychiatric:        Attention and Perception: Attention normal.        Mood and Affect: Mood normal.        Speech: Speech normal.        Behavior: Behavior is cooperative.        Thought Content: Thought content normal.        Cognition and Memory: Cognition normal.        Judgment: Judgment normal.     Review of Systems  Constitutional: Positive for fatigue.  HENT: Negative.   Eyes: Negative.   Respiratory: Positive for cough and shortness of breath.   Cardiovascular: Negative.   Gastrointestinal: Negative.   Musculoskeletal: Negative.   Skin: Negative.   Neurological: Negative.   Psychiatric/Behavioral: Negative.     Blood pressure 126/82, pulse 93, temperature 98 F (36.7 C), temperature source Oral, resp. rate 17, height 6' (1.829 m), weight 76.2 kg, SpO2 96 %.Body mass index is 22.78 kg/m.  General Appearance: Casual  Eye Contact:  Good  Speech:  Clear and Coherent  Volume:  Normal  Mood:  Euthymic  Affect:  Congruent   Thought Process:  Goal Directed  Orientation:  Full (Time, Place, and Person)  Thought Content:  Logical  Suicidal Thoughts:  No  Homicidal Thoughts:  No  Memory:  Immediate;   Fair Recent;   Fair Remote;   Fair  Judgement:  Fair  Insight:  Fair  Psychomotor Activity:  Normal  Concentration:  Concentration: Fair  Recall:  [employer] of Knowledge:  Fair  Language:  Fair  Akathisia:  No  Handed:  Right  AIMS (if indicated):     Assets:  Desire for Improvement Housing  ADL's:  Intact  Cognition:  WNL  Sleep:        Treatment Plan Summary: Medication management and Plan 69 year old man with schizophrenia in the hospital with what might turn out to be lung cancer.  Patient was able to describe the reason for being in the hospital and the potential diagnosis.  Joseph Holt was able to understand that this may be a fatal illness.  Joseph Holt was able to describe a biopsy in basic terms and understood what the reason was for requesting it.  Patient said Joseph Holt had no problem with giving consent for any of those.  Denied any suicidal thoughts.  Says that Joseph Holt wants to have what ever care would be appropriate.  Patient has full capacity at this point to make decisions.  His psychiatric medicines have been continued and are appropriate for now.  I will follow-up as needed and if there is any concern about a specific capacity issue please let me know but at this point it does not seem to be a problem.  Disposition: No  evidence of imminent risk to self or others at present.   Patient does not meet criteria for psychiatric inpatient admission. Supportive therapy provided about ongoing stressors.  Alethia Berthold, MD 09/17/2020 5:21 PM

## 2020-09-17 NOTE — Progress Notes (Signed)
Patient ID: Joseph Curb., male   DOB: 1950/12/28, 69 y.o.   MRN: [id number] [facility] PROGRESS NOTE  Joseph Curb. ONG:[id number] DOB: 05-24-1951 DOA: 09/14/2020 PCP: Mar Daring, PA-C  HPI/Subjective: Patient still has a little shortness of breath and cough.  States he does feel little bit better since coming in.  Admitted with pneumonia but then found to have metastatic lung cancer.  Objective: Vitals:   09/17/20 1135 09/17/20 1200  BP: 122/75 (!) 142/95  Pulse: (!) 105 (!) 106  Resp:    Temp:    SpO2: 96% 95%    Intake/Output Summary (Last 24 hours) at 09/17/2020 1546 Last data filed at 09/17/2020 1409 Gross per 24 hour  Intake 1070 ml  Output 900 ml  Net 170 ml   Filed Weights   09/14/20 1411 09/14/20 1954 09/17/20 0814  Weight: 77.1 kg 78.2 kg 76.2 kg    ROS: Review of Systems  Respiratory: Positive for cough, shortness of breath and wheezing.   Cardiovascular: Negative for chest pain.  Gastrointestinal: Negative for abdominal pain, nausea and vomiting.   Exam: Physical Exam HENT:     Head: Normocephalic.     Mouth/Throat:     Pharynx: No oropharyngeal exudate.  Eyes:     General: Lids are normal.     Conjunctiva/sclera: Conjunctivae normal.     Pupils: Pupils are equal, round, and reactive to light.  Cardiovascular:     Rate and Rhythm: Normal rate and regular rhythm.     Heart sounds: Normal heart sounds, S1 normal and S2 normal.  Pulmonary:     Breath sounds: Examination of the right-lower field reveals decreased breath sounds and wheezing. Examination of the left-lower field reveals decreased breath sounds and wheezing. Decreased breath sounds and wheezing present. No rhonchi or rales.  Abdominal:     Palpations: Abdomen is soft.     Tenderness: There is no abdominal tenderness.  Musculoskeletal:     Right lower leg: No swelling.     Left lower leg: No swelling.  Skin:    General: Skin is warm.     Findings: No rash.   Neurological:     Mental Status: He is alert and oriented to person, place, and time.       Data Reviewed: Basic Metabolic Panel: Recent Labs  Lab 09/14/20 1438 09/15/20 0523  NA 140 138  K 4.0 3.9  CL 101 104  CO2 26 27  GLUCOSE 135* 94  BUN 23 17  CREATININE 1.04 0.85  CALCIUM 8.6* 8.3*  MG  --  1.8  PHOS  --  3.5   Liver Function Tests: Recent Labs  Lab 09/14/20 1438 09/15/20 0523  AST 20 13*  ALT 15 13  ALKPHOS 64 53  BILITOT 0.7 0.5  PROT 6.7 5.6*  ALBUMIN 2.8* 2.3*   CBC: Recent Labs  Lab 09/14/20 1438 09/15/20 0523  WBC 8.1 7.6  NEUTROABS  --  5.0  HGB 11.8* 10.5*  HCT 37.7* 32.8*  MCV 92.9 91.9  PLT 220 221   BNP (last 3 results) Recent Labs    09/14/20 1438  BNP 51.7      Recent Results (from the past 240 hour(s))  Resp Panel by RT-PCR (Flu A&B, Covid) Nasopharyngeal Swab     Status: None   Collection Time: 09/14/20  2:22 PM   Specimen: Nasopharyngeal Swab; Nasopharyngeal(NP) swabs in vial transport medium  Result Value Ref Range Status   SARS Coronavirus 2 by RT  PCR NEGATIVE NEGATIVE Final    Comment: (NOTE) SARS-CoV-2 target nucleic acids are NOT DETECTED.  The SARS-CoV-2 RNA is generally detectable in upper respiratory specimens during the acute phase of infection. The lowest concentration of SARS-CoV-2 viral copies this assay can detect is 138 copies/mL. A negative result does not preclude SARS-Cov-2 infection and should not be used as the sole basis for treatment or other patient management decisions. A negative result may occur with  improper specimen collection/handling, submission of specimen other than nasopharyngeal swab, presence of viral mutation(s) within the areas targeted by this assay, and inadequate number of viral copies(<138 copies/mL). A negative result must be combined with clinical observations, patient history, and epidemiological information. The expected result is Negative.  Fact Sheet for Patients:   EntrepreneurPulse.com.au  Fact Sheet for Healthcare Providers:  IncredibleEmployment.be  This test is no t yet approved or cleared by the [place] FDA and  has been authorized for detection and/or diagnosis of SARS-CoV-2 by FDA under an Emergency Use Authorization (EUA). This EUA will remain  in effect (meaning this test can be used) for the duration of the COVID-19 declaration under Section 564(b)(1) of the Act, 21 U.S.C.section 360bbb-3(b)(1), unless the authorization is terminated  or revoked sooner.       Influenza A by PCR NEGATIVE NEGATIVE Final   Influenza B by PCR NEGATIVE NEGATIVE Final    Comment: (NOTE) The Xpert Xpress SARS-CoV-2/FLU/RSV plus assay is intended as an aid in the diagnosis of influenza from Nasopharyngeal swab specimens and should not be used as a sole basis for treatment. Nasal washings and aspirates are unacceptable for Xpert Xpress SARS-CoV-2/FLU/RSV testing.  Fact Sheet for Patients: EntrepreneurPulse.com.au  Fact Sheet for Healthcare Providers: IncredibleEmployment.be  This test is not yet approved or cleared by the [place] FDA and has been authorized for detection and/or diagnosis of SARS-CoV-2 by FDA under an Emergency Use Authorization (EUA). This EUA will remain in effect (meaning this test can be used) for the duration of the COVID-19 declaration under Section 564(b)(1) of the Act, 21 U.S.C. section 360bbb-3(b)(1), unless the authorization is terminated or revoked.  Performed at [facility], [address] [place], [place] [address]   Culture, blood (Routine X 2) w Reflex to ID Panel     Status: None (Preliminary result)   Collection Time: 09/14/20 11:49 PM   Specimen: BLOOD  Result Value Ref Range Status   Specimen Description BLOOD LEFT ANTECUBITAL  Final   Special Requests   Final    BOTTLES DRAWN AEROBIC AND ANAEROBIC Blood Culture  adequate volume   Culture   Final    NO GROWTH 2 DAYS Performed at [facility], [address], [place], [place] [address]    Report Status PENDING  Incomplete  Culture, blood (Routine X 2) w Reflex to ID Panel     Status: None (Preliminary result)   Collection Time: 09/14/20 11:49 PM   Specimen: BLOOD  Result Value Ref Range Status   Specimen Description BLOOD BLOOD LEFT HAND  Final   Special Requests   Final    BOTTLES DRAWN AEROBIC AND ANAEROBIC Blood Culture adequate volume   Culture   Final    NO GROWTH 2 DAYS Performed at [facility], [address] [place], [place] [address]    Report Status PENDING  Incomplete  MRSA PCR Screening     Status: None   Collection Time: 09/16/20 12:15 PM   Specimen: Nasopharyngeal  Result Value Ref Range Status  MRSA by PCR NEGATIVE NEGATIVE Final    Comment:        The GeneXpert MRSA Assay (FDA approved for NASAL specimens only), is one component of a comprehensive MRSA colonization surveillance program. It is not intended to diagnose MRSA infection nor to guide or monitor treatment for MRSA infections. Performed at [facility], [address], [place], [place] [address]      Studies: [facility] Chest [place] 1 View  Result Date: 09/17/2020 CLINICAL DATA:  Status post right thoracentesis. EXAM: PORTABLE CHEST 1 VIEW COMPARISON:  09/14/2020. FINDINGS: 1204 hours. Stable asymmetric elevation right hemidiaphragm. Bullous change noted right lung apex. Relatively diffuse patchy airspace disease again noted right lung with nodular opacities in the left lung better characterized on recent chest CT. Medial/suprahilar left upper lobe pulmonary mass lesion evident. No substantial left pleural effusion. Cardiopericardial silhouette is at upper limits of normal for size. The visualized bony structures of the thorax show no acute abnormality. IMPRESSION: 1. No pneumothorax status post right thoracentesis. 2. Persistent patchy  airspace disease right lung with left lung nodules better characterized on recent chest CT. The right pulmonary nodule seen on previous chest CT are largely obscured by the airspace disease on today's x-ray. Electronically Signed   By: Misty Stanley M.D.   On: 09/17/2020 12:29   US THORACENTESIS ASP PLEURAL SPACE W/IMG GUIDE  Result Date: 09/17/2020 INDICATION: Patient with history of smoking with worsening shortness of breath and cough found to have a right sided pleural effusion. Request is for diagnostic right sided thoracentesis. EXAM: ULTRASOUND GUIDED RIGHT SIDED DIAGNOSTIC THORACENTESIS MEDICATIONS: Lidocaine 1% 10 mL COMPLICATIONS: None immediate. PROCEDURE: An ultrasound guided thoracentesis was thoroughly discussed with the patient and questions answered. The benefits, risks, alternatives and complications were also discussed. The patient understands and wishes to proceed with the procedure. Written consent was obtained. Ultrasound was performed to localize and mark an adequate pocket of fluid in the right chest. The area was then prepped and draped in the normal sterile fashion. 1% Lidocaine was used for local anesthesia. Under ultrasound guidance a 6 Fr Safe-T-Centesis catheter was introduced. Thoracentesis was performed. The catheter was removed and a dressing applied. FINDINGS: A total of approximately 250 mL of straw-colored fluid was removed. Samples were sent to the laboratory as requested by the clinical team. IMPRESSION: Successful ultrasound guided diagnostic right-sided thoracentesis yielding 250 mL of pleural fluid. Read by: Rushie Nyhan, NP Electronically Signed   By: Markus Daft M.D.   On: 09/17/2020 12:42    Scheduled Meds: . amantadine  100 mg Oral BID  . chlorproMAZINE  25 mg Oral TID  . divalproex  500 mg Oral BID  . memantine  28 mg Oral Daily   And  . donepezil  10 mg Oral Daily  . enoxaparin (LOVENOX) injection  40 mg Subcutaneous Q24H  . fluticasone  furoate-vilanterol  1 puff Inhalation Daily  . gabapentin  400 mg Oral Daily  . icosapent Ethyl  2 g Oral BID  . LORazepam  0.5 mg Oral QHS  . methylPREDNISolone (SOLU-MEDROL) injection  40 mg Intravenous Daily  . mirtazapine  30 mg Oral QHS  . pantoprazole  40 mg Oral Daily  . QUEtiapine  600 mg Oral QHS  . simvastatin  20 mg Oral QHS  . tamsulosin  0.4 mg Oral QHS  . cyanocobalamin  1,000 mcg Oral Daily   Continuous Infusions: . sodium chloride Stopped (09/15/20 0011)  . azithromycin Stopped (09/16/20 1846)  . cefTRIAXone (ROCEPHIN)  IV Stopped (09/16/20 1933)    Assessment/Plan:  1. Large left upper lobe lung mass with multiple nodules and large mediastinal nodes which is consistent with metastatic disease.  Suspect metastatic lung cancer.  Thoracentesis done today drew off 250 mL underneath the right lung.  This was sent for cytology.  Hopefully we can get a diagnosis with this.  Oncology on the case.  I had to speak with the patient about the findings again today because I do not think he recalled what I told him yesterday.  Palliative care consultation. 2. Multifocal nodular pneumonia.  Sputum culture if able to do so.  On Rocephin and Zithromax. 3. COPD exacerbation on Solu-Medrol.  Continue nebulizer treatments. 4. Acute hypoxic respiratory failure.  As per nursing staff the patient does desaturate with ambulation. 5. Hyperlipidemia unspecified on simvastatin 6. BPH on Flomax 7. Bipolar and schizoaffective disorder.  Continue psychiatric medication.  Dr. Rogue Bussing wanted to get a psychiatric consultation about his competence to make medical decisions. 8. Generalized weakness.  Continue physical therapy in the hospital.        Code Status:     Code Status Orders  (From admission, onward)         Start     Ordered   09/14/20 2332  Full code  Continuous        09/14/20 2332        Code Status History    Date Active Date Inactive Code Status Order ID Comments User  Context   01/02/2018 1723 01/14/2018 1739 Full Code [id number]  Clovis Fredrickson, MD Inpatient   Advance Care Planning Activity     Family Communication: Spoke with the patient's brother on the phone at [phone]. Disposition Plan: Status is: Inpatient  Dispo: The patient is from: Group home              Anticipated d/c is to: Group home              Anticipated d/c date is: Dependent on clinical course and whether cytology is diagnostic or not.  May end up needing tissue diagnosis before disposition.              Patient currently being treated for pneumonia and COPD exacerbation.  Trying to see if we can get him off the oxygen.  Also found to have metastatic lung masses.  Trying to make diagnosis with thoracentesis cytology.  May end up needing tissue biopsy before disposition if cytology nondiagnostic  Consultants:  Oncology  Pulmonary  Interventional radiology  Procedures:  Thoracentesis  Antibiotics:  Rocephin and Zithromax  Time spent: 28 minutes, case discussed with oncology.  [place]  [facility] [employer]

## 2020-09-17 NOTE — Progress Notes (Signed)
PMT consult received and chart reviewed. This AM, met with patient. Joseph Holt does wish to pursue oncology workup. Spoke with brother, Joseph Holt this afternoon. Joseph Holt shares good insight on Joseph Holt's medical/psych history and his understanding of Joseph Holt's condition. Joseph Holt is ok with updates on his brother's condition but is not interested in serving as documented HCPOA for Joseph Holt. Discussed consideration of state legal guardianship for Joseph Holt. Joseph Holt understands and agrees. Consulted TOC/SW to assist. PMT will follow inpatient.   Full palliative note to follow.  NO CHARGE  Ihor Dow, [place], FNP-C Palliative Medicine Team  Phone: [phone] Fax: [phone]

## 2020-09-17 NOTE — Procedures (Signed)
Ultrasound-guided diagnostic right sided thoracentesis performed yielding 250 mililiters of straw colored fluid. No immediate complications.   Diagnostic fluid was sent to the lab for further analysis. Follow-up chest x-ray pending. EBL is < 2 ml.

## 2020-09-18 ENCOUNTER — Encounter: Payer: Self-pay | Admitting: Internal Medicine

## 2020-09-18 ENCOUNTER — Inpatient Hospital Stay: Payer: Medicare Other

## 2020-09-18 MED ORDER — IOHEXOL 9 MG/ML PO SOLN
500.0000 mL | ORAL | Status: AC
  Administered 2020-09-18 (×2): 500 mL via ORAL

## 2020-09-18 MED ORDER — IOHEXOL 300 MG/ML  SOLN
100.0000 mL | Freq: Once | INTRAMUSCULAR | Status: AC | PRN
  Administered 2020-09-18: 11:00:00 100 mL via INTRAVENOUS

## 2020-09-18 MED ORDER — IOHEXOL 300 MG/ML  SOLN: 100.0000 mL | Freq: Once | INTRAMUSCULAR | Status: DC | PRN

## 2020-09-18 NOTE — TOC Progression Note (Addendum)
Transition of Care (TOC) - Progression Note    Patient Details  Name: Joseph Holt. MRN: [id number] Date of Birth: 01/11/1951  Transition of Care [facility]) CM/SW [place], LCSW Phone Number: 09/18/2020, 10:21 AM  Clinical Narrative: CSW discussed speech recommendations with group home caregiver. She will have to discuss with group home staff. CSW faxed over speech eval note for them to review. CSW spoke with [facility] representative yesterday. They do have a speech therapist if needed.    12:15 pm: Left voicemail for Ms. Bruce to make sure she received fax and to see if they had reviewed it yet. Per MD, patient drank some contrast this morning without issue.  2:08 pm: Group home is able to accept patient back with current diet recommendations. CSW met with patient. He is agreeable to home health through [facility]. Discussed DME recommendation for a rolling walker. Patient declined.  Expected Discharge Plan: Group Home (with home health services.) Barriers to Discharge: Continued Medical Work up  Expected Discharge Plan and Services Expected Discharge Plan: Group Home (with home health services.)       Living arrangements for the past 2 months: Group Home                                       Social Determinants of Health (SDOH) Interventions    Readmission Risk Interventions No flowsheet data found.

## 2020-09-18 NOTE — Progress Notes (Addendum)
Speech Language Pathology Treatment: Dysphagia  Patient Details Name: Joseph Holt. MRN: [id number] DOB: 1951/07/29 Today's Date: 09/18/2020 Time: 6295-2841 SLP Time Calculation (min) (ACUTE ONLY): 45 min  Assessment / Plan / Recommendation Clinical Impression  Pt seen for toleration of diet and trials to upgrade diet consistency today if able. Pt is alert, verbally engaged -- asking for a shave and sherbert ice cream. He followed general instructions w/ verbal cues from SLP. He was eager to have trials of thin liquids - water. NSG reported pt had drink contrast earlier today for test.  Pt appears to present w/ adequate oropharyngeal phase swallow w/ No oropharyngeal phase dysphagia noted w/ trials of thin liquids given today -- general aspiration precautions followed; No neuromuscular deficits noted. Pt consumed po trials of thin liquids via cup/straw w/ No overt, clinical s/s of aspiration during po trials. Pt appears at reduced risk for aspiration following general aspiration precautions. Pt exhibited shaky UEs holding the cup but adequate. During po trials, pt consumed ~6 ozs of thin liquids w/ no overt coughing, decline in vocal quality, or change in respiratory presentation during/post trials. Oral phase appeared [facility] w/ timely bolus management and control of bolus propulsion for A-P transfer for swallowing. Oral clearing achieved w/ all trial consistencies. Speech Clear. Pt fed self w/ setup support and positioning support Upright for intake.  Recommend a Minced foods diet consistency, moistened foods d/t mostly Edentulous status; Thin liquids. Recommend general aspiration precautions, Pills WHOLE in Puree for safer, easier swallowing especially while in the hospital. Education given on Pills in Puree; food consistencies and easy to eat options; general aspiration precautions; handouts left in room. NSG to reconsult if any new needs arise. NSG agreed. MD/SW updated. Of note, pt can explore  returning to his baseline diet of more solid foods if appropriate for pt to do so; recommend Supervision and/or skilled ST services as indicated if attempting diet upgrade (foods) d/t Edentulous status.    HPI HPI: Per sdmitting H&P "Joseph Holt. is a 69 y.o. male with medical history significant for bipolar, chronic tobacco abuse, GERD, hyperlipidemia, BPH, who is admitted to [facility] on 09/14/2020 with community-acquired pneumonia after presenting from group home to [facility] Emergency Department complaining of shortness of breath.      The patient reports 3 to 4 days of progressive shortness of breath associated with productive cough.  He denies any associated hemoptysis, orthopnea, PND, or peripheral edema.  He also denies any associated subjective fever, chills, rigors, or generalized myalgias.  Not associate with any neck stiffness, rhinitis, rhinorrhea, sore throat, wheezing, nausea, vomiting, abdominal pain, diarrhea, or rash.  No recent traveling or known COVID-19 exposures.  He denies any dysuria, gross hematuria, or change in urinary urgency/frequency.  Denies any recent chest pain, diaphoresis, or palpitations."      SLP Plan  All goals met       Recommendations  Diet recommendations: Dysphagia 2 (fine chop);Thin liquid Liquids provided via: Cup;Straw (monitor for any coughing w/straws) Medication Administration: Whole meds with puree (for safer swallowing) Supervision: Patient able to self feed;Intermittent supervision to cue for compensatory strategies Compensations: Minimize environmental distractions;Slow rate;Small sips/bites;Lingual sweep for clearance of pocketing;Follow solids with liquid Postural Changes and/or Swallow Maneuvers: Seated upright 90 degrees;Upright 30-60 min after meal;Out of bed for meals                General recommendations:  (Dietician f/u as needed) Oral Care Recommendations: Oral care BID;Oral care  before and after  PO;Staff/trained caregiver to provide oral care Follow up Recommendations:  (Group Home previous setting) SLP Visit Diagnosis: Dysphagia, oropharyngeal phase (R13.12) Plan: All goals met       GO                 Orinda Kenner, MS, CCC-SLP Speech Language Pathologist Rehab Services [phone] [facility] 09/18/2020, 3:05 PM

## 2020-09-18 NOTE — Progress Notes (Signed)
Physical Therapy Treatment Patient Details Name: Joseph Holt. MRN: [id number] DOB: [birth year] Today's Date: 09/18/2020    History of Present Illness Pt is a 69 y/o M who presented to ED from group home on 09/14/20 with c/o SOB & admitted for tx of CAP. PMH: bipolar, chronic tobacco abuse, GERD, HLD, BPH    PT Comments    Pt very pleasant this pm, tolerated ambulation in hallway ~ 40ft x 2 with seated rest break due to increased fatigue.  Pt able to maintain balance while ambulating without AD with CGA for safety. Pt did not utilize a RW as previous, however may benefit from a device to decrease fatigue and/or possible w/c due to questionable new diagnosis of Lung CA. O2 sats remained in upper 90's upon exertion on RA.  Plan for pt to return to group home once medically stable.     Follow Up Recommendations  Home health PT;Supervision/Assistance - 24 hour;Supervision for mobility/OOB     Equipment Recommendations  Rolling walker with 5" wheels    Recommendations for Other Services       Precautions / Restrictions Precautions Precautions: Fall    Mobility  Bed Mobility                  Transfers Overall transfer level: Needs assistance Equipment used: None Transfers: Sit to/from Stand;Stand Pivot Transfers Sit to Stand: Min assist Stand pivot transfers: Min guard          Ambulation/Gait Ambulation/Gait assistance: Supervision Gait Distance (Feet): 20 Feet Assistive device: None Gait Pattern/deviations: Shuffle;Narrow base of support Gait velocity: Slow   General Gait Details: Pt ambulated without O2 with sats in upper 90's, no LOB, pt fatigues quickly   Stairs             Wheelchair Mobility    Modified Rankin (Stroke Patients Only)       Balance                                            Cognition Arousal/Alertness: Awake/alert Behavior During Therapy: WFL for tasks assessed/performed Overall Cognitive  Status: Difficult to assess                                 General Comments: Pt provides PLOF/home set up information that contradicts what's in the medical record. Pt does not attempt to recall current month/year, instead states "I don't know"      Exercises      General Comments        Pertinent Vitals/Pain Pain Assessment: 0-10 Pain Score: 3  Pain Location: Left Shoulder Pain Descriptors / Indicators: Aching Pain Intervention(s): Patient requesting pain meds-RN notified    Home Living                      Prior Function            PT Goals (current goals can now be found in the care plan section) Acute Rehab PT Goals Patient Stated Goal: none stated    Frequency    Min 2X/week      PT Plan Current plan remains appropriate    Co-evaluation              AM-PAC PT "6 Clicks" Mobility   Outcome Measure  Help  needed turning from your back to your side while in a flat bed without using bedrails?: A Little Help needed moving from lying on your back to sitting on the side of a flat bed without using bedrails?: A Little Help needed moving to and from a bed to a chair (including a wheelchair)?: A Little Help needed standing up from a chair using your arms (e.g., wheelchair or bedside chair)?: A Lot Help needed to walk in hospital room?: A Lot Help needed climbing 3-5 steps with a railing? : A Lot 6 Click Score: 15    End of Session Equipment Utilized During Treatment: Gait belt Activity Tolerance: Patient tolerated treatment well Patient left: in chair;with chair alarm set;with call bell/phone within reach Nurse Communication: Mobility status PT Visit Diagnosis: Unsteadiness on feet (R26.81);Difficulty in walking, not elsewhere classified (R26.2);Muscle weakness (generalized) (M62.81)     Time: 8329-1916 PT Time Calculation (min) (ACUTE ONLY): 23 min  Charges:  $Therapeutic Activity: 23-37 mins                     Mikel Cella, PTA   Josie Dixon 09/18/2020, 3:37 PM

## 2020-09-18 NOTE — Progress Notes (Signed)
Patient ID: Joseph Curb., male   DOB: December 18, 1950, 69 y.o.   MRN: [id number] [facility] PROGRESS NOTE  Joseph Curb. JHE:[id number] DOB: [birth year] DOA: 09/14/2020 PCP: Mar Daring, PA-C  HPI/Subjective: Patient feeling little bit better.  States his breathing is a little bit better a little less coughing.  Feeling better than when he came in  Objective: Vitals:   09/18/20 0823 09/18/20 1225  BP: 110/66 118/81  Pulse: 94 (!) 102  Resp: (!) 22 20  Temp: 98 F (36.7 C) (!) 97.4 F (36.3 C)  SpO2: 91% 94%    Intake/Output Summary (Last 24 hours) at 09/18/2020 1508 Last data filed at 09/18/2020 1400 Gross per 24 hour  Intake 1013.42 ml  Output 2850 ml  Net -1836.58 ml   Filed Weights   09/14/20 1411 09/14/20 1954 09/17/20 0814  Weight: 77.1 kg 78.2 kg 76.2 kg    ROS: Review of Systems  Respiratory: Positive for cough and shortness of breath.   Cardiovascular: Negative for chest pain.  Gastrointestinal: Negative for abdominal pain, nausea and vomiting.   Exam: Physical Exam HENT:     Head: Normocephalic.     Mouth/Throat:     Pharynx: No oropharyngeal exudate.  Eyes:     General: Lids are normal.     Conjunctiva/sclera: Conjunctivae normal.     Pupils: Pupils are equal, round, and reactive to light.  Cardiovascular:     Rate and Rhythm: Normal rate and regular rhythm.     Heart sounds: Normal heart sounds, S1 normal and S2 normal.  Pulmonary:     Breath sounds: Examination of the right-lower field reveals decreased breath sounds. Examination of the left-lower field reveals decreased breath sounds. Decreased breath sounds present. No wheezing, rhonchi or rales.  Abdominal:     Palpations: Abdomen is soft.     Tenderness: There is no abdominal tenderness.  Musculoskeletal:     Right ankle: No swelling.     Left ankle: No swelling.  Skin:    General: Skin is warm.     Findings: No rash.  Neurological:     Mental Status: He is alert.      Comments: Answers questions appropriately.       Data Reviewed: Basic Metabolic Panel: Recent Labs  Lab 09/14/20 1438 09/15/20 0523  NA 140 138  K 4.0 3.9  CL 101 104  CO2 26 27  GLUCOSE 135* 94  BUN 23 17  CREATININE 1.04 0.85  CALCIUM 8.6* 8.3*  MG  --  1.8  PHOS  --  3.5   Liver Function Tests: Recent Labs  Lab 09/14/20 1438 09/15/20 0523  AST 20 13*  ALT 15 13  ALKPHOS 64 53  BILITOT 0.7 0.5  PROT 6.7 5.6*  ALBUMIN 2.8* 2.3*   CBC: Recent Labs  Lab 09/14/20 1438 09/15/20 0523  WBC 8.1 7.6  NEUTROABS  --  5.0  HGB 11.8* 10.5*  HCT 37.7* 32.8*  MCV 92.9 91.9  PLT 220 221   BNP (last 3 results) Recent Labs    09/14/20 1438  BNP 51.7     Recent Results (from the past 240 hour(s))  Resp Panel by RT-PCR (Flu A&B, Covid) Nasopharyngeal Swab     Status: None   Collection Time: 09/14/20  2:22 PM   Specimen: Nasopharyngeal Swab; Nasopharyngeal(NP) swabs in vial transport medium  Result Value Ref Range Status   SARS Coronavirus 2 by RT PCR NEGATIVE NEGATIVE Final    Comment: (NOTE)  SARS-CoV-2 target nucleic acids are NOT DETECTED.  The SARS-CoV-2 RNA is generally detectable in upper respiratory specimens during the acute phase of infection. The lowest concentration of SARS-CoV-2 viral copies this assay can detect is 138 copies/mL. A negative result does not preclude SARS-Cov-2 infection and should not be used as the sole basis for treatment or other patient management decisions. A negative result may occur with  improper specimen collection/handling, submission of specimen other than nasopharyngeal swab, presence of viral mutation(s) within the areas targeted by this assay, and inadequate number of viral copies(<138 copies/mL). A negative result must be combined with clinical observations, patient history, and epidemiological information. The expected result is Negative.  Fact Sheet for Patients:   EntrepreneurPulse.com.au  Fact Sheet for Healthcare Providers:  IncredibleEmployment.be  This test is no t yet approved or cleared by the [place] FDA and  has been authorized for detection and/or diagnosis of SARS-CoV-2 by FDA under an Emergency Use Authorization (EUA). This EUA will remain  in effect (meaning this test can be used) for the duration of the COVID-19 declaration under Section 564(b)(1) of the Act, 21 U.S.C.section 360bbb-3(b)(1), unless the authorization is terminated  or revoked sooner.       Influenza A by PCR NEGATIVE NEGATIVE Final   Influenza B by PCR NEGATIVE NEGATIVE Final    Comment: (NOTE) The Xpert Xpress SARS-CoV-2/FLU/RSV plus assay is intended as an aid in the diagnosis of influenza from Nasopharyngeal swab specimens and should not be used as a sole basis for treatment. Nasal washings and aspirates are unacceptable for Xpert Xpress SARS-CoV-2/FLU/RSV testing.  Fact Sheet for Patients: EntrepreneurPulse.com.au  Fact Sheet for Healthcare Providers: IncredibleEmployment.be  This test is not yet approved or cleared by the [place] FDA and has been authorized for detection and/or diagnosis of SARS-CoV-2 by FDA under an Emergency Use Authorization (EUA). This EUA will remain in effect (meaning this test can be used) for the duration of the COVID-19 declaration under Section 564(b)(1) of the Act, 21 U.S.C. section 360bbb-3(b)(1), unless the authorization is terminated or revoked.  Performed at [facility], [address] [place], [place] [address]   Culture, blood (Routine X 2) w Reflex to ID Panel     Status: None (Preliminary result)   Collection Time: 09/14/20 11:49 PM   Specimen: BLOOD  Result Value Ref Range Status   Specimen Description BLOOD LEFT ANTECUBITAL  Final   Special Requests   Final    BOTTLES DRAWN AEROBIC AND ANAEROBIC Blood Culture  adequate volume   Culture   Final    NO GROWTH 3 DAYS Performed at [facility], [address], [place], [place] [address]    Report Status PENDING  Incomplete  Culture, blood (Routine X 2) w Reflex to ID Panel     Status: None (Preliminary result)   Collection Time: 09/14/20 11:49 PM   Specimen: BLOOD  Result Value Ref Range Status   Specimen Description BLOOD BLOOD LEFT HAND  Final   Special Requests   Final    BOTTLES DRAWN AEROBIC AND ANAEROBIC Blood Culture adequate volume   Culture   Final    NO GROWTH 3 DAYS Performed at [facility], [address], [place], [place] [address]    Report Status PENDING  Incomplete  MRSA PCR Screening     Status: None   Collection Time: 09/16/20 12:15 PM   Specimen: Nasopharyngeal  Result Value Ref Range Status   MRSA by PCR NEGATIVE NEGATIVE Final  Comment:        The GeneXpert MRSA Assay (FDA approved for NASAL specimens only), is one component of a comprehensive MRSA colonization surveillance program. It is not intended to diagnose MRSA infection nor to guide or monitor treatment for MRSA infections. Performed at [facility], [address] [place], [place] [address]   Body fluid culture     Status: None (Preliminary result)   Collection Time: 09/17/20 11:50 AM   Specimen: PATH Cytology Pleural fluid  Result Value Ref Range Status   Specimen Description   Final    PLEURAL Performed at [facility], [address] [place], [place] [address]    Special Requests   Final    NONE Performed at [facility], [address] [place], [place] [address]    Gram Stain   Final    MODERATE WBC PRESENT,BOTH PMN AND MONONUCLEAR NO ORGANISMS SEEN    Culture   Final    NO GROWTH < 24 HOURS Performed at [facility], [place] [address] [place], [place] [address]    Report Status PENDING  Incomplete     Studies: CT ABDOMEN PELVIS W CONTRAST  Result Date: 09/18/2020 CLINICAL  DATA:  Newly diagnosed lung carcinoma.  Staging. EXAM: CT ABDOMEN AND PELVIS WITH CONTRAST TECHNIQUE: Multidetector CT imaging of the abdomen and pelvis was performed using the standard protocol following bolus administration of intravenous contrast. CONTRAST:  131mL OMNIPAQUE IOHEXOL 300 MG/ML  SOLN COMPARISON:  Recent chest CT on 09/15/2020 FINDINGS: Lower Chest: Numerous pulmonary nodules are again seen in the visualized portions of the lung bases as well as probable postobstructive atelectasis or pneumonitis in the medial right lower lobe, without significant change compared to recent chest CT. Hepatobiliary: No hepatic masses identified. A few small cysts are noted in right and left lobes. Gallbladder is unremarkable. No evidence of biliary ductal dilatation. Pancreas:  No mass or inflammatory changes. Spleen: Within normal limits in size and appearance. Adrenals/Urinary Tract: No adrenal or renal masses identified. No evidence of ureteral calculi or hydronephrosis. Stomach/Bowel: No evidence of obstruction, inflammatory process or abnormal fluid collections. Vascular/Lymphatic: No pathologically enlarged lymph nodes. Infrarenal abdominal aortic aneurysm is seen measuring 5.6 cm in maximum diameter. No evidence of aneurysm leak or rupture. Reproductive:  No mass or other significant abnormality. Other:  None. Musculoskeletal:  No suspicious bone lesions identified. IMPRESSION: Pulmonary metastases in visualized portions of both lung bases. No evidence of abdominal or pelvic metastatic disease. 5.6 cm infrarenal abdominal aortic aneurysm. Recommend referral to a vascular specialist. This recommendation follows ACR consensus guidelines: White Paper of the ACR Incidental Findings Committee II on Vascular Findings. J Am Coll Radiol 2013; 10:789-794. Electronically Signed   By: Marlaine Hind M.D.   On: 09/18/2020 11:40   DG Chest Port 1 View  Result Date: 09/17/2020 CLINICAL DATA:  Status post right  thoracentesis. EXAM: PORTABLE CHEST 1 VIEW COMPARISON:  09/14/2020. FINDINGS: 1204 hours. Stable asymmetric elevation right hemidiaphragm. Bullous change noted right lung apex. Relatively diffuse patchy airspace disease again noted right lung with nodular opacities in the left lung better characterized on recent chest CT. Medial/suprahilar left upper lobe pulmonary mass lesion evident. No substantial left pleural effusion. Cardiopericardial silhouette is at upper limits of normal for size. The visualized bony structures of the thorax show no acute abnormality. IMPRESSION: 1. No pneumothorax status post right thoracentesis. 2. Persistent patchy airspace disease right lung with left lung nodules better characterized on recent chest CT. The  right pulmonary nodule seen on previous chest CT are largely obscured by the airspace disease on today's x-ray. Electronically Signed   By: Misty Stanley M.D.   On: 09/17/2020 12:29   US THORACENTESIS ASP PLEURAL SPACE W/IMG GUIDE  Result Date: 09/17/2020 INDICATION: Patient with history of smoking with worsening shortness of breath and cough found to have a right sided pleural effusion. Request is for diagnostic right sided thoracentesis. EXAM: ULTRASOUND GUIDED RIGHT SIDED DIAGNOSTIC THORACENTESIS MEDICATIONS: Lidocaine 1% 10 mL COMPLICATIONS: None immediate. PROCEDURE: An ultrasound guided thoracentesis was thoroughly discussed with the patient and questions answered. The benefits, risks, alternatives and complications were also discussed. The patient understands and wishes to proceed with the procedure. Written consent was obtained. Ultrasound was performed to localize and mark an adequate pocket of fluid in the right chest. The area was then prepped and draped in the normal sterile fashion. 1% Lidocaine was used for local anesthesia. Under ultrasound guidance a 6 Fr Safe-T-Centesis catheter was introduced. Thoracentesis was performed. The catheter was removed and a  dressing applied. FINDINGS: A total of approximately 250 mL of straw-colored fluid was removed. Samples were sent to the laboratory as requested by the clinical team. IMPRESSION: Successful ultrasound guided diagnostic right-sided thoracentesis yielding 250 mL of pleural fluid. Read by: Rushie Nyhan, NP Electronically Signed   By: Markus Daft M.D.   On: 09/17/2020 12:42    Scheduled Meds: . amantadine  100 mg Oral BID  . chlorproMAZINE  25 mg Oral TID  . divalproex  500 mg Oral BID  . memantine  28 mg Oral Daily   And  . donepezil  10 mg Oral Daily  . enoxaparin (LOVENOX) injection  40 mg Subcutaneous Q24H  . fluticasone furoate-vilanterol  1 puff Inhalation Daily  . gabapentin  400 mg Oral Daily  . icosapent Ethyl  2 g Oral BID  . LORazepam  0.5 mg Oral QHS  . methylPREDNISolone (SOLU-MEDROL) injection  40 mg Intravenous Daily  . mirtazapine  30 mg Oral QHS  . pantoprazole  40 mg Oral Daily  . QUEtiapine  600 mg Oral QHS  . simvastatin  20 mg Oral QHS  . tamsulosin  0.4 mg Oral QHS  . cyanocobalamin  1,000 mcg Oral Daily   Continuous Infusions: . sodium chloride Stopped (09/17/20 2044)  . cefTRIAXone (ROCEPHIN)  IV Stopped (09/17/20 1826)    Assessment/Plan:  1. Large left upper lobe lung mass with multiple nodules and enlarged mediastinal nodes consistent with metastatic disease.  Suspect metastatic lung cancer.  Thoracentesis yesterday drew off 250 mL.  Cytology pending.  Oncology would like a diagnosis prior to discharge.  If cytology comes back malignant cells then can go back to the group home.  If cytology comes back negative then will need a tissue biopsy before disposition.  Appreciate palliative care consultation. 2. Multifocal nodular pneumonia.  Patient completed Zithromax and on Rocephin. 3. COPD exacerbation on Solu-Medrol continue nebulizer treatments. 4. Acute hypoxic respiratory failure.  Patient now off oxygen again. 5. Hyperlipidemia unspecified.  Continue  simvastatin 6. BPH.  Continue Flomax 7. Bipolar and schizoaffective disorder.  Appreciate psychiatric consultation.  Psychiatry did mention that he does have competence to make medical decisions at this point. 8. Generalized weakness.  Physical therapy recommended home with home health 9. Patient put on dysphagia diet but upgraded to thin liquids today.     Code Status:     Code Status Orders  (From admission, onward)  Start     Ordered   09/14/20 2332  Full code  Continuous        09/14/20 2332        Code Status History    Date Active Date Inactive Code Status Order ID Comments User Context   01/02/2018 1723 01/14/2018 1739 Full Code [id number]  Clovis Fredrickson, MD Inpatient   Advance Care Planning Activity     Family Communication: Spoke with the patient's brother on the phone yesterday at [phone] Disposition Plan: Status is: Inpatient  Dispo: The patient is from: Group home              Anticipated d/c is to: Group home              Anticipated d/c date is: Oncology would like a diagnosis prior to disposition.  Awaiting cytology.  Once results of cytology comes back can potentially make a disposition if positive.  If cytology comes back negative then will need another area to do a biopsy.              Patient currently diagnosed with a lung mass likely metastatic lung cancer and also multifocal pneumonia.  Consultants:  Pulmonology  Oncology  Antibiotics:  Finish Zithromax  On Rocephin  Time spent: 27 minutes  [place]

## 2020-09-18 NOTE — Clinical Social Work Note (Signed)
CSW acknowledges consult for legal guardianship. Per psychiatry evaluation, patient has capacity to make his own decisions so seeking guardianship is not appropriate at this time. Patient's brother noted to not want to be his 53. Patient does not have to designate a family member to be his 57.   Joseph Holt, [place]

## 2020-09-18 NOTE — Progress Notes (Signed)
Daily Progress Note   Patient Name: Joseph Holt.       Date: 09/19/20 DOB: Aug 15, 1951  Age: 69 y.o. MRN#: [id number] Attending Physician: Loletha Grayer, MD Primary Care Physician: Mar Daring, PA-C Admit Date: 09/14/2020  Reason for Consultation/Follow-up: Establishing goals of care  Subjective: Patient sleeping. Did not wake. Appears comfortable without s/s of pain or distress. Per Dr. Posey Pronto, eager for discharge back to group home with his friends today if possible.   GOC:  F/u with brother, Rayburn Ma via telephone. Provided update on care plan including psych evaluation and patient having capacity to make and understand decisions at this point. Explained plan for discharge today and outpatient oncology/pulmonology follow-up. Although Rayburn Ma does not wish to be documented HCPOA, he does plan to continue intermittent telephone follow-up with Jenny Reichmann and providers. Answered questions. Rayburn Ma is appreciative of call.   Length of Stay: 4  Current Medications: Scheduled Meds:  . amantadine  100 mg Oral BID  . chlorproMAZINE  25 mg Oral TID  . divalproex  500 mg Oral BID  . memantine  28 mg Oral Daily   And  . donepezil  10 mg Oral Daily  . enoxaparin (LOVENOX) injection  40 mg Subcutaneous Q24H  . fluticasone furoate-vilanterol  1 puff Inhalation Daily  . gabapentin  400 mg Oral Daily  . icosapent Ethyl  2 g Oral BID  . LORazepam  0.5 mg Oral QHS  . methylPREDNISolone (SOLU-MEDROL) injection  40 mg Intravenous Daily  . mirtazapine  30 mg Oral QHS  . pantoprazole  40 mg Oral Daily  . QUEtiapine  600 mg Oral QHS  . simvastatin  20 mg Oral QHS  . tamsulosin  0.4 mg Oral QHS  . cyanocobalamin  1,000 mcg Oral Daily    Continuous Infusions: . sodium chloride Stopped  (09/17/20 2044)  . azithromycin Stopped (09/17/20 2031)  . cefTRIAXone (ROCEPHIN)  IV Stopped (09/17/20 1826)    PRN Meds: sodium chloride, acetaminophen **OR** acetaminophen  Physical Exam Vitals and nursing note reviewed.  Constitutional:      General: He is awake.     Appearance: He is cachectic. He is ill-appearing.  HENT:     Head: Normocephalic and atraumatic.  Pulmonary:     Effort: No tachypnea, accessory muscle usage or respiratory distress.  Skin:  General: Skin is warm and dry.     Coloration: Skin is pale.  Neurological:     Mental Status: He is alert and oriented to person, place, and time.  Psychiatric:        Mood and Affect: Mood normal.        Speech: Speech normal.        Behavior: Behavior normal.            Vital Signs: BP 118/81 (BP Location: Right Arm)   Pulse (!) 102   Temp (!) 97.4 F (36.3 C) (Oral)   Resp 20   Ht 6' (1.829 m)   Wt 76.2 kg   SpO2 94%   BMI 22.78 kg/m  SpO2: SpO2: 94 % O2 Device: O2 Device: Room Air O2 Flow Rate: O2 Flow Rate (L/min): 2 L/min  Intake/output summary:   Intake/Output Summary (Last 24 hours) at 09/18/2020 1328 Last data filed at 09/18/2020 0825 Gross per 24 hour  Intake 1013.42 ml  Output 1975 ml  Net -961.58 ml   LBM: Last BM Date: 09/17/20 Baseline Weight: Weight: 77.1 kg Most recent weight: Weight: 76.2 kg       Palliative Assessment/Data: PPS 50%   Flowsheet Rows   Flowsheet Row Most Recent Value  Intake Tab   Referral Department Hospitalist  Unit at Time of Referral Cardiac/Telemetry Unit  Palliative Care Primary Diagnosis Cancer  Palliative Care Type New Palliative care  Reason for referral Clarify Goals of Care  Date first seen by Palliative Care 09/17/20  Clinical Assessment   Palliative Performance Scale Score 50%  Psychosocial & Spiritual Assessment   Palliative Care Outcomes   Patient/Family meeting held? Yes  Who was at the meeting? patient. Brother Rayburn Ma)  Palliative Care  Outcomes Clarified goals of care, Provided psychosocial or spiritual support, Linked to palliative care logitudinal support, ACP counseling assistance      Patient Active Problem List   Diagnosis Date Noted  . S/P thoracentesis   . Palliative care by specialist   . Goals of care, counseling/discussion   . [name] 09/16/2020  . Malignant neoplasm of right lung ([place])   . Benign prostatic hyperplasia without lower urinary tract symptoms   . COPD with acute exacerbation ([place])   . Acute respiratory failure with hypoxia ([place])   . Hyperlipidemia   . Multifocal pneumonia 09/14/2020  . Shortness of breath 09/14/2020  . Generalized weakness 09/14/2020  . GERD (gastroesophageal reflux disease) 09/14/2020  . Mild protein-calorie malnutrition ([place]) 12/06/2018  . Mixed simple and mucopurulent chronic bronchitis ([place]) 12/06/2018  . Sleep disturbance 09/06/2018  . Bipolar 1 disorder ([place]) 04/14/2018  . Schizo affective schizophrenia ([place]) 04/14/2018  . Cognitive impairment 01/06/2018  . Tobacco use disorder 01/02/2018  . Paranoid schizophrenia ([place]) 02/21/2015  . Social maladjustment 02/21/2015    Palliative Care Assessment & Plan   Patient Profile: 69 y.o. male  with past medical history of bipolar, schizoaffective disorder, chronic tobacco abuse, GERD, HLD, BPH admitted on 09/14/2020 with shortness of breath from group home. CT chest revealed large left upper lobe lung mass with multiple nodules, large mediastinal nodes consistent with metastatic disease. Suspect metastatic lung cancer. Oncology consulted. S/P thoracentesis 12/13. Pathology pending. Psych consult pending for capacity. Palliative medicine consultation for goals of care.   Assessment: Large LUL mass, multiple nodules and large mediastinal nodes consistent with metastatic disease Multifocal pneumonia Acute respiratory failure COPD exacerbation Bipolar/schizoaffective disorder Generalized  weakness  Recommendations/Plan:  Continue full  code/full scope treatment.  Patient desires oncology workup and treatment if eligible. Pathology pending. Oncology following.   Appreciate psych evaluation. At this point, patient does have capacity to make and understand medical decisions.   Brother, Jill Ruppe is primary contact. Lives in [place]. Rayburn Ma is agreeable with updates on Cirilo's condition but has no interest in becoming surrogate decision-maker/HCPOA. Likely will eventually need state legal guardianship if he is becomes incompetent.   May benefit from ongoing palliative f/u for support and [place] discussions.   Code Status: FULL   Code Status Orders  (From admission, onward)         Start     Ordered   09/14/20 2332  Full code  Continuous        09/14/20 2332        Code Status History    Date Active Date Inactive Code Status Order ID Comments User Context   01/02/2018 1723 01/14/2018 1739 Full Code [id number]  Clovis Fredrickson, MD Inpatient   Advance Care Planning Activity       Prognosis:  Poor long-term prognosis  Discharge Planning:  Group home with home health   Care plan was discussed with brother Rayburn Ma), Dr Posey Pronto  Thank you for allowing the Palliative Medicine Team to assist in the care of this patient.   Total Time 20 Prolonged Time Billed  no    Greater than 50% of this time was spent counseling and coordinating care related to the above assessment and plan.   Ihor Dow, DNP, FNP-C Palliative Medicine Team  Phone: [phone] Fax: [phone]  Please contact Palliative Medicine Team phone at [phone] for questions and concerns.

## 2020-09-19 LAB — COMP PANEL: LEUKEMIA/LYMPHOMA

## 2020-09-19 MED ORDER — CEFDINIR 300 MG PO CAPS
300.0000 mg | ORAL_CAPSULE | Freq: Two times a day (BID) | ORAL | Status: DC
  Filled 2020-09-19: qty 1

## 2020-09-19 MED ORDER — ADULT MULTIVITAMIN W/MINERALS CH: 1.0000 | ORAL_TABLET | Freq: Every day | ORAL | Status: DC

## 2020-09-19 MED ORDER — CEFDINIR 300 MG PO CAPS: 300.0000 mg | ORAL_CAPSULE | Freq: Two times a day (BID) | ORAL | 0 refills | Status: DC

## 2020-09-19 MED ORDER — ENSURE ENLIVE PO LIQD: 237.0000 mL | Freq: Three times a day (TID) | ORAL | Status: DC

## 2020-09-19 MED ORDER — ENSURE ENLIVE PO LIQD: 237.0000 mL | Freq: Three times a day (TID) | ORAL | 12 refills | Status: AC

## 2020-09-19 NOTE — Progress Notes (Signed)
Initial Nutrition Assessment  DOCUMENTATION CODES:   Underweight  INTERVENTION:   Ensure Enlive po TID, each supplement provides 350 kcal and 20 grams of protein  Magic cup TID with meals, each supplement provides 290 kcal and 9 grams of protein  MVI daily   Pt at high refeed risk; recommend monitor potassium, magnesium and phosphorus labs daily until stable  NUTRITION DIAGNOSIS:   Increased nutrient needs related to chronic illness (COPD, lung mass) as evidenced by estimated needs  GOAL:   Patient will meet greater than or equal to 90% of their needs  MONITOR:   PO intake,Supplement acceptance,Labs,Weight trends,Skin,I & O's  REASON FOR ASSESSMENT:   Other (Comment) (Low BMI)    ASSESSMENT:   69 y.o. male  with past medical history of bipolar, schizoaffective disorder, chronic tobacco abuse, GERD, HLD, BPH admitted on 09/14/2020 with shortness of breath from group home and found to have PNA. CT chest revealed large left upper lobe lung mass with multiple nodules, large mediastinal nodes consistent with metastatic disease. Suspect metastatic lung cancer.   RD working remotely.  Unable to reach pt by phone. Per chart review, pt with fair appetite and oral intake in hospital; pt eating anywhere from 50-100% of meals. RD suspects pt with decreased appetite and oral intake pta as pt is underweight. Per chart, pt has lost 49lbs(28%) over the past 9 months; this is severe weight loss. Pt seen by SLP who recommended dysphagia 2/thin liquid diet. RD will add supplements and MVI to help pt meet his estimated needs; pt is at high refeed risk. RD highly suspects pt with severe malnutrition but unable to diagnose at this time. RD will obtain nutrition related history and exam at follow-up.   Medications reviewed and include: solu-medrol, remeron, protonix, B12, ceftriaxone  Labs reviewed: K 3.9 wnl, P 3.5 wnl, Mg 1.8 wnl Hgb 10.5(L), Hct 32.8(L)  NUTRITION - FOCUSED PHYSICAL  EXAM: Unable to perform at this time   Diet Order:   Diet Order            DIET DYS 2 Room service appropriate? Yes with Assist; Fluid consistency: Thin  Diet effective now                EDUCATION NEEDS:   Not appropriate for education at this time  Skin:  Skin Assessment: Reviewed RN Assessment  Last BM:  12/14- TYPE 4  Height:   Ht Readings from Last 1 Encounters:  09/14/20 6' (1.829 m)    Weight:   Wt Readings from Last 1 Encounters:  09/19/20 57.5 kg    Ideal Body Weight:  80.9 kg  BMI:  Body mass index is 17.19 kg/m.  Estimated Nutritional Needs:   Kcal:  2000-2300kcal/day  Protein:  100-115g/day  Fluid:  1.5-1.7L/day  Koleen Distance MS, RD, LDN Please refer to [facility] for RD and/or RD on-call/weekend/after hours pager

## 2020-09-19 NOTE — Progress Notes (Signed)
Pulmonary Medicine          Date: 09/19/2020,   MRN# [id number] Joseph Holt. 1951/05/14     AdmissionWeight: 77.1 kg                 CurrentWeight: 57.5 kg  Referring physician: Dr. Leslye Peer    CHIEF COMPLAINT:   Abnormal chest imaging suggestive of pneumonia versus malignancy   HISTORY OF PRESENT ILLNESS   Is a pleasant 69 year old male he has a history of bipolar disorder as well as schizoaffective disorder, he is a lifelong smoker has been smoking since age 51 and states that for the last 8 weeks has been feeling malaise severe fatigue and overall unwell.  He has stopped smoking completely over the last 8 weeks and states that he has been with cough productive of mild expectorate.  Additionally patient states he had lost approximately 10 pound.  He was admitted to our [facility] service for community-acquired pneumonia and has been treated with typical antimicrobial coverage with Rocephin and Zithromax.  He had CT chest done with findings of a consolidated right lower lobe infiltrate as well as bilateral well-circumscribed nodular disease suggestive of possible metastatic implants and a left upper lobe well-circumscribed spiculated mass.  09/17/20-patient in no apparent distress resting in bed breathing freely on room air.  S/p Thoracentesis with 250 cc straw colored fluid with profile pending. Patient is stable may finish remainder of workup on outpatient from pulmonary perspective  09/19/20- patient was not able to get biopsy procedure scheduled due to OR/Anesthesia unvailable times. Patient will be discharged home with outpatient workup and biopsy . I have met with patient and reviewed details of care plan and separately discussed with RN.   PAST MEDICAL HISTORY   Past Medical History:  Diagnosis Date  . Bipolar 1 disorder ([place])   . Schizo affective schizophrenia [facility])      SURGICAL HISTORY   Past Surgical History:  Procedure Laterality Date  . APPENDECTOMY     . HERNIA REPAIR       FAMILY HISTORY   History reviewed. No pertinent family history.   SOCIAL HISTORY   Social History   Tobacco Use  . Smoking status: Current Every Day Smoker    Packs/day: 0.20    Types: Cigarettes  . Smokeless tobacco: Never Used  . Tobacco comment: pt smokes 1 cigarette a day  Substance Use Topics  . Alcohol use: No  . Drug use: No     MEDICATIONS    Home Medication:    Current Medication:  Current Facility-Administered Medications:  .  0.9 %  sodium chloride infusion, , Intravenous, PRN, Howerter, Justin B, DO, Last Rate: 10 mL/hr at 09/18/20 1602, 1,000 mL at 09/18/20 1602 .  acetaminophen (TYLENOL) tablet 650 mg, 650 mg, Oral, Q6H PRN, 650 mg at 09/18/20 1515 **OR** acetaminophen (TYLENOL) suppository 650 mg, 650 mg, Rectal, Q6H PRN, Howerter, Justin B, DO .  amantadine (SYMMETREL) capsule 100 mg, 100 mg, Oral, BID, Howerter, Justin B, DO, 100 mg at 09/19/20 0853 .  cefdinir (OMNICEF) capsule 300 mg, 300 mg, Oral, Q12H, Fritzi Mandes, MD .  chlorproMAZINE (THORAZINE) tablet 25 mg, 25 mg, Oral, TID, Howerter, Justin B, DO, 25 mg at 09/19/20 0852 .  divalproex (DEPAKOTE) DR tablet 500 mg, 500 mg, Oral, BID, Howerter, Justin B, DO, 500 mg at 09/19/20 0852 .  memantine (NAMENDA XR) 24 hr capsule 28 mg, 28 mg, Oral, Daily, 28 mg at 09/19/20 0853 **AND** donepezil (  ARICEPT) tablet 10 mg, 10 mg, Oral, Daily, Renae Gloss, Richard, MD, 10 mg at 09/19/20 0853 .  feeding supplement (ENSURE ENLIVE / ENSURE PLUS) liquid 237 mL, 237 mL, Oral, TID BM, Allena Katz, Sona, MD .  fluticasone furoate-vilanterol (BREO ELLIPTA) 200-25 MCG/INH 1 puff, 1 puff, Inhalation, Daily, Howerter, Justin B, DO, 1 puff at 09/18/20 0837 .  gabapentin (NEURONTIN) capsule 400 mg, 400 mg, Oral, Daily, Renae Gloss, Richard, MD, 400 mg at 09/19/20 0852 .  icosapent Ethyl (VASCEPA) 1 g capsule 2 g, 2 g, Oral, BID, Otelia Sergeant, RPH, 2 g at 09/18/20 2300 .  LORazepam (ATIVAN) tablet 0.5 mg, 0.5 mg,  Oral, QHS, Howerter, Justin B, DO, 0.5 mg at 09/18/20 2057 .  methylPREDNISolone sodium succinate (SOLU-MEDROL) 40 mg/mL injection 40 mg, 40 mg, Intravenous, Daily, Renae Gloss, Richard, MD, 40 mg at 09/19/20 0847 .  mirtazapine (REMERON) tablet 30 mg, 30 mg, Oral, QHS, Wieting, Richard, MD, 30 mg at 09/18/20 2057 .  [START ON 09/20/2020] multivitamin with minerals tablet 1 tablet, 1 tablet, Oral, Daily, Enedina Finner, MD .  pantoprazole (PROTONIX) EC tablet 40 mg, 40 mg, Oral, Daily, Howerter, Justin B, DO, 40 mg at 09/19/20 0852 .  QUEtiapine (SEROQUEL XR) 24 hr tablet 600 mg, 600 mg, Oral, QHS, Howerter, Justin B, DO, 600 mg at 09/18/20 2057 .  simvastatin (ZOCOR) tablet 20 mg, 20 mg, Oral, QHS, Howerter, Justin B, DO, 20 mg at 09/18/20 2057 .  tamsulosin (FLOMAX) capsule 0.4 mg, 0.4 mg, Oral, QHS, Howerter, Justin B, DO, 0.4 mg at 09/18/20 2057 .  vitamin B-12 (CYANOCOBALAMIN) tablet 1,000 mcg, 1,000 mcg, Oral, Daily, Renae Gloss, Richard, MD, 1,000 mcg at 09/19/20 7124    ALLERGIES   Patient has no known allergies.     REVIEW OF SYSTEMS    Review of Systems:  Gen:  Denies  fever, sweats, chills weigh loss  HEENT: Denies blurred vision, double vision, ear pain, eye pain, hearing loss, nose bleeds, sore throat Cardiac:  No dizziness, chest pain or heaviness, chest tightness,edema Resp:   Denies cough or sputum porduction, shortness of breath,wheezing, hemoptysis,  Gi: Denies swallowing difficulty, stomach pain, nausea or vomiting, diarrhea, constipation, bowel incontinence Gu:  Denies bladder incontinence, burning urine Ext:   Denies Joint pain, stiffness or swelling Skin: Denies  skin rash, easy bruising or bleeding or hives Endoc:  Denies polyuria, polydipsia , polyphagia or weight change Psych:   Denies depression, insomnia or hallucinations   Other:  All other systems negative   VS: BP 119/76   Pulse 98   Temp 98.2 F (36.8 C) (Oral)   Resp 16   Ht 6' (1.829 m)   Wt 57.5 kg    SpO2 90%   BMI 17.19 kg/m      PHYSICAL EXAM    GENERAL:NAD, no fevers, chills, no weakness no fatigue HEAD: Normocephalic, atraumatic.  EYES: Pupils equal, round, reactive to light. Extraocular muscles intact. No scleral icterus.  MOUTH: Moist mucosal membrane. Dentition intact. No abscess noted.  EAR, NOSE, THROAT: Clear without exudates. No external lesions.  NECK: Supple. No thyromegaly. No nodules. No JVD.  PULMONARY: Diffuse coarse rhonchi right sided +wheezes CARDIOVASCULAR: S1 and S2. Regular rate and rhythm. No murmurs, rubs, or gallops. No edema. Pedal pulses 2+ bilaterally.  GASTROINTESTINAL: Soft, nontender, nondistended. No masses. Positive bowel sounds. No hepatosplenomegaly.  MUSCULOSKELETAL: No swelling, clubbing, or edema. Range of motion full in all extremities.  NEUROLOGIC: Cranial nerves II through XII are intact. No gross focal neurological deficits. Sensation  intact. Reflexes intact.  SKIN: No ulceration, lesions, rashes, or cyanosis. Skin warm and dry. Turgor intact.  PSYCHIATRIC: Mood, affect within normal limits. The patient is awake, alert and oriented x 3. Insight, judgment intact.       IMAGING    CT CHEST W CONTRAST  Result Date: 09/15/2020 CLINICAL DATA:  Cough. EXAM: CT CHEST WITH CONTRAST TECHNIQUE: Multidetector CT imaging of the chest was performed during intravenous contrast administration. CONTRAST:  36m OMNIPAQUE IOHEXOL 300 MG/ML  SOLN COMPARISON:  September 14, 2020. FINDINGS: Cardiovascular: Atherosclerosis of thoracic aorta is noted without aneurysm or dissection. Normal cardiac size. No pericardial effusion. Mediastinum/Nodes: Thyroid gland is unremarkable. Esophagus appears normal. 2.2 cm subcarinal lymph node is noted 2.5 cm precarinal lymph node is noted. 2.4 cm lymph node is noted in aortopulmonary window. Lungs/Pleura: No pneumothorax is noted. 4.1 x 3.0 cm spiculated mass is noted medially in the left upper lobe consistent with  malignancy. Multiple pulmonary nodules are noted diffusely and bilaterally consistent with metastatic disease. Emphysematous disease is noted in the lung apices. There is a large area of consolidation involving the right upper and lower lobes bilaterally concerning for postobstructive pneumonia or atelectasis secondary to significant narrowing of the right upper and lower lobe bronchi most likely due to hilar adenopathy. Small right pleural effusion is noted as well. Upper Abdomen: No acute abnormality. Musculoskeletal: No chest wall abnormality. No acute or significant osseous findings. IMPRESSION: 1. 4.1 x 3.0 cm spiculated mass is noted medially in the left upper lobe consistent with malignancy. 2. Multiple pulmonary nodules are noted diffusely and bilaterally consistent with metastatic disease. 3. Large area of consolidation is noted involving the right upper and lower lobes bilaterally concerning for postobstructive pneumonia or atelectasis secondary to significant narrowing of the right upper and lower lobe bronchi most likely due to right hilar adenopathy. 4. Small right pleural effusion is noted as well. 5. Enlarged mediastinal lymph nodes are noted consistent with metastatic disease. These results will be called to the ordering clinician or representative by the Radiologist Assistant, and communication documented in the PACS or zVision Dashboard. Aortic Atherosclerosis (ICD10-I70.0) and Emphysema (ICD10-J43.9). Electronically Signed   By: JMarijo ConceptionM.D.   On: 09/15/2020 16:05   CT ABDOMEN PELVIS W CONTRAST  Result Date: 09/18/2020 CLINICAL DATA:  Newly diagnosed lung carcinoma.  Staging. EXAM: CT ABDOMEN AND PELVIS WITH CONTRAST TECHNIQUE: Multidetector CT imaging of the abdomen and pelvis was performed using the standard protocol following bolus administration of intravenous contrast. CONTRAST:  1011mOMNIPAQUE IOHEXOL 300 MG/ML  SOLN COMPARISON:  Recent chest CT on 09/15/2020 FINDINGS: Lower  Chest: Numerous pulmonary nodules are again seen in the visualized portions of the lung bases as well as probable postobstructive atelectasis or pneumonitis in the medial right lower lobe, without significant change compared to recent chest CT. Hepatobiliary: No hepatic masses identified. A few small cysts are noted in right and left lobes. Gallbladder is unremarkable. No evidence of biliary ductal dilatation. Pancreas:  No mass or inflammatory changes. Spleen: Within normal limits in size and appearance. Adrenals/Urinary Tract: No adrenal or renal masses identified. No evidence of ureteral calculi or hydronephrosis. Stomach/Bowel: No evidence of obstruction, inflammatory process or abnormal fluid collections. Vascular/Lymphatic: No pathologically enlarged lymph nodes. Infrarenal abdominal aortic aneurysm is seen measuring 5.6 cm in maximum diameter. No evidence of aneurysm leak or rupture. Reproductive:  No mass or other significant abnormality. Other:  None. Musculoskeletal:  No suspicious bone lesions identified. IMPRESSION: Pulmonary metastases in visualized  portions of both lung bases. No evidence of abdominal or pelvic metastatic disease. 5.6 cm infrarenal abdominal aortic aneurysm. Recommend referral to a vascular specialist. This recommendation follows ACR consensus guidelines: White Paper of the ACR Incidental Findings Committee II on Vascular Findings. J Am Coll Radiol 2013; 10:789-794. Electronically Signed   By: Danae Orleans M.D.   On: 09/18/2020 11:40   DG Chest Port 1 View  Result Date: 09/17/2020 CLINICAL DATA:  Status post right thoracentesis. EXAM: PORTABLE CHEST 1 VIEW COMPARISON:  09/14/2020. FINDINGS: 1204 hours. Stable asymmetric elevation right hemidiaphragm. Bullous change noted right lung apex. Relatively diffuse patchy airspace disease again noted right lung with nodular opacities in the left lung better characterized on recent chest CT. Medial/suprahilar left upper lobe pulmonary mass  lesion evident. No substantial left pleural effusion. Cardiopericardial silhouette is at upper limits of normal for size. The visualized bony structures of the thorax show no acute abnormality. IMPRESSION: 1. No pneumothorax status post right thoracentesis. 2. Persistent patchy airspace disease right lung with left lung nodules better characterized on recent chest CT. The right pulmonary nodule seen on previous chest CT are largely obscured by the airspace disease on today's x-ray. Electronically Signed   By: Kennith Center M.D.   On: 09/17/2020 12:29   DG Chest Portable 1 View  Result Date: 09/14/2020 CLINICAL DATA:  Short of breath.  History of COPD EXAM: PORTABLE CHEST 1 VIEW COMPARISON:  09/01/2005 FINDINGS: Extensive airspace disease on the right. Nodular type densities in the left lung, especially the left upper lobe medially. Findings compatible with pneumonia. Small right effusion. Heart size normal.  Vascularity normal. IMPRESSION: Diffuse bilateral airspace disease right greater than left. Probable pneumonia. Nodular component to the airspace disease on the left possibly due to lung nodules. Follow-up until clearing recommended. Electronically Signed   By: Marlan Palau M.D.   On: 09/14/2020 15:17   US THORACENTESIS ASP PLEURAL SPACE W/IMG GUIDE  Result Date: 09/17/2020 INDICATION: Patient with history of smoking with worsening shortness of breath and cough found to have a right sided pleural effusion. Request is for diagnostic right sided thoracentesis. EXAM: ULTRASOUND GUIDED RIGHT SIDED DIAGNOSTIC THORACENTESIS MEDICATIONS: Lidocaine 1% 10 mL COMPLICATIONS: None immediate. PROCEDURE: An ultrasound guided thoracentesis was thoroughly discussed with the patient and questions answered. The benefits, risks, alternatives and complications were also discussed. The patient understands and wishes to proceed with the procedure. Written consent was obtained. Ultrasound was performed to localize and mark  an adequate pocket of fluid in the right chest. The area was then prepped and draped in the normal sterile fashion. 1% Lidocaine was used for local anesthesia. Under ultrasound guidance a 6 Fr Safe-T-Centesis catheter was introduced. Thoracentesis was performed. The catheter was removed and a dressing applied. FINDINGS: A total of approximately 250 mL of straw-colored fluid was removed. Samples were sent to the laboratory as requested by the clinical team. IMPRESSION: Successful ultrasound guided diagnostic right-sided thoracentesis yielding 250 mL of pleural fluid. Read by: Anders Grant, NP Electronically Signed   By: Richarda Overlie M.D.   On: 09/17/2020 12:42      ASSESSMENT/PLAN   Right lower lobe pneumonia Patient with absence of leukocytosis and presence of constitutional symptoms-as well as imaging with well-circumscribed asymmetric bilateral solid pulmonary nodules suggestive of metastatic implants secondary to malignancy, there is surrounding small pleural effusion I will order diagnostic thoracentesis with cancer work-up as well as Gram stain and culture rarely fungal disease can present this way. -MRSA PCR to de-escalate  antibiotics -Calcitonin trend -Oncology evaluation process appreciate input -If possible to do PET scan while inpatient may allow [place] to get biopsy on least invasive location while patient is in-house. -procalcitonin very low - consider d/c antibiotics - currently on zithromax rocephin  Left upper lobe spiculated mass -High pretest probability of malignancy with lifelong smoking status recent weight loss and severe undue fatigue pointing towards malignancy, there is associated hilar mediastinal lymphadenopathy which can be reactive or due to metastasis -Thoracentesis done - studies pending -Patient states he is interested in work-up and therapy for cancer -Oncology consultation -appreciate input   Bullous centrilobular emphysema -Generic COPD care path with duo nebs  every 6 hours while awake as well as inhaled corticosteroids -Recommend PT OT as well as incentive spirometer while inpatient   Moderate protein calorie malnutrition -Bitemporal wasting -Decreased albumin level -Peripheral muscle atrophy -May be cancer associated malnourishement -RD consultation recommended     Thank you for allowing me to participate in the care of this patient.   Patient/Family are satisfied with care plan and all questions have been answered.  This document was prepared using Dragon voice recognition software and may include unintentional dictation errors.     Ottie Glazier, M.D.  Division of [facility]

## 2020-09-19 NOTE — Discharge Summary (Signed)
[place] [facility] [place] NAME: Joseph Holt    MR#:  [id number]  DATE OF BIRTH:  [birth year]  DATE OF ADMISSION:  09/14/2020 ADMITTING PHYSICIAN: Rhetta Mura, DO  DATE OF DISCHARGE: 09/19/2020  PRIMARY CARE PHYSICIAN: Mar Daring, PA-C    ADMISSION DIAGNOSIS:  CAP (community acquired pneumonia) [J18.9] Community acquired pneumonia, unspecified laterality [J18.9]  DISCHARGE DIAGNOSIS:  large left upper lobe mass with multiple nodules suspected cancer--- workup to be done as outpatient multifocal pneumonia acute hypoxic respiratory failure resolved history of bipolar and schizoaffective disorder  SECONDARY DIAGNOSIS:   Past Medical History:  Diagnosis Date  . Bipolar 1 disorder ([place])   . Schizo affective schizophrenia [facility])     HOSPITAL COURSE:  1. Large left upper lobe lung mass with multiple nodules and enlarged mediastinal nodes consistent with metastatic disease.  Suspect metastatic lung cancer.  Thoracentesis yesterday drew off 250 mL.  Cytology still pending as of today.   discussed with oncology pulmonology. Outpatient endoscopy guided bronchoscopy will be arranged by pulmonary  Dr Lanney Gins.   Appreciate palliative care consultation. 2. Multifocal nodular pneumonia.  Patient completed Zithromax and on Rocephin-- change to oral antibiotic for two more days 3. COPD exacerbation on Solu-Medrol continue nebulizer treatments.-- Patient currently not wheezing. Discontinued Solu-Medrol. 4. Acute hypoxic respiratory failure.  Patient now off oxygen again. 5. Hyperlipidemia unspecified.  Continue simvastatin 6. BPH.  Continue Flomax 7. Bipolar and schizoaffective disorder.  Appreciate psychiatric consultation.  Psychiatry did mention that he does have competence to make medical decisions at this point. 8. Generalized weakness.  Physical therapy recommended home with home health 9. Patient put on dysphagia diet but upgraded to  thin liquids today.  Overall hemodynamically stable. Will discharged to home with outpatient follow-up pulmonary and oncology.   CONSULTS OBTAINED:  Treatment Team:  Ottie Glazier, MD Cammie Sickle, MD [place], Madie Reno, MD  DRUG ALLERGIES:  No Known Allergies  DISCHARGE MEDICATIONS:   Allergies as of 09/19/2020   No Known Allergies     Medication List    TAKE these medications   Advair Diskus 250-50 MCG/DOSE Aepb Generic drug: Fluticasone-Salmeterol INHALE 1 PUFF TWICE DAILY. What changed: how much to take   amantadine 100 MG capsule Commonly known as: SYMMETREL TAKE 1 CAPSULE BY MOUTH TWICE DAILY. **DO NOT CRUSH** What changed: See the new instructions.   cefdinir 300 MG capsule Commonly known as: OMNICEF Take 1 capsule (300 mg total) by mouth every 12 (twelve) hours.   cetirizine 10 MG tablet Commonly known as: ZYRTEC TAKE (1) TABLET BY MOUTH ONCE DAILY. What changed: See the new instructions.   chlorproMAZINE 25 MG tablet Commonly known as: THORAZINE TAKE 1 TABLET BY MOUTH 3 TIMES DAILY.   cyanocobalamin 1000 MCG tablet Take 1 tablet (1,000 mcg total) by mouth daily.   divalproex 500 MG DR tablet Commonly known as: DEPAKOTE TAKE 1 TABLET BY MOUTH TWICE DAILY. **DO NOT CRUSH** What changed: See the new instructions.   feeding supplement Liqd Take 237 mLs by mouth 3 (three) times daily between meals.   gabapentin 400 MG capsule Commonly known as: NEURONTIN TAKE 1 TABLET BY MOUTH EVERY MORNING. What changed:   how much to take  when to take this   LORazepam 0.5 MG tablet Commonly known as: ATIVAN TAKE 1 TABLET BY MOUTH AT BEDTIME.   meloxicam 15 MG tablet Commonly known as: MOBIC TAKE (1) TABLET BY MOUTH ONCE DAILY. What changed: See the new  instructions.   mirtazapine 30 MG tablet Commonly known as: REMERON TAKE 1 TABLET BY MOUTH AT BEDTIME.   Namzaric 28-10 MG Cp24 Generic drug: Memantine HCl-Donepezil HCl TAKE ONE CAPSULE  ONCE A DAY. * DO NOT CRUSH * What changed: See the new instructions.   omeprazole 20 MG capsule Commonly known as: PRILOSEC TAKE ONE CAPSULE BY MOUTH EVERY MORNING *DO NOT CRUSH* What changed: See the new instructions.   QUEtiapine 300 MG 24 hr tablet Commonly known as: SEROQUEL XR TAKE 2 TABLETS BY MOUTH AT BEDTIME.   simvastatin 20 MG tablet Commonly known as: ZOCOR TAKE 1 TABLET BY MOUTH AT BEDTIME.   tamsulosin 0.4 MG Caps capsule Commonly known as: FLOMAX TAKE (1) CAPSULE BY MOUTH AT BEDTIME.*DO NOT CRUSH* What changed:   how much to take  how to take this  when to take this  additional instructions   Vascepa 1 g capsule Generic drug: icosapent Ethyl TAKE 2 CAPSULES BY MOUTH TWO TIMES A DAY What changed: See the new instructions.   vitamin C 1000 MG tablet TAKE ONE TABLET BY MOUTH EVERY DAY       If you experience worsening of your admission symptoms, develop shortness of breath, life threatening emergency, suicidal or homicidal thoughts you must seek medical attention immediately by calling 911 or calling your MD immediately  if symptoms less severe.  You Must read complete instructions/literature along with all the possible adverse reactions/side effects for all the Medicines you take and that have been prescribed to you. Take any new Medicines after you have completely understood and accept all the possible adverse reactions/side effects.   Please note  You were cared for by a hospitalist during your hospital stay. If you have any questions about your discharge medications or the care you received while you were in the hospital after you are discharged, you can call the unit and asked to speak with the hospitalist on call if the hospitalist that took care of you is not available. Once you are discharged, your primary care physician will handle any further medical issues. Please note that NO REFILLS for any discharge medications will be authorized once you are  discharged, as it is imperative that you return to your primary care physician (or establish a relationship with a primary care physician if you do not have one) for your aftercare needs so that they can reassess your need for medications and monitor your lab values. Today   SUBJECTIVE    Doing well. No complaints VITAL SIGNS:  Blood pressure 119/76, pulse 98, temperature 98.2 F (36.8 C), temperature source Oral, resp. rate 16, height 6' (1.829 m), weight 57.5 kg, SpO2 90 %.  I/O:    Intake/Output Summary (Last 24 hours) at 09/19/2020 1528 Last data filed at 09/19/2020 1350 Gross per 24 hour  Intake 960 ml  Output 1000 ml  Net -40 ml    PHYSICAL EXAMINATION:  GENERAL:  69 y.o.-year-old patient lying in the bed with no acute distress.  HEENT: Head atraumatic, normocephalic. Oropharynx and nasopharynx clear.  NECK:  Supple, no jugular venous distention. No thyroid enlargement, no tenderness.  LUNGS: Normal breath sounds bilaterally, no wheezing, rales,rhonchi or crepitation. No use of accessory muscles of respiration.  CARDIOVASCULAR: S1, S2 normal. No murmurs, rubs, or gallops.  ABDOMEN: Soft, non-tender, non-distended. Bowel sounds present. No organomegaly or mass.  EXTREMITIES: No pedal edema, cyanosis, or clubbing.  NEUROLOGIC: Cranial nerves II through XII are intact. Muscle strength 5/5 in all extremities. Sensation intact.  Gait not checked.  PSYCHIATRIC: The patient is alert and oriented x 2.  SKIN: No obvious rash, lesion, or ulcer.   DATA REVIEW:   CBC  Recent Labs  Lab 09/15/20 0523  WBC 7.6  HGB 10.5*  HCT 32.8*  PLT 221    Chemistries  Recent Labs  Lab 09/15/20 0523  NA 138  K 3.9  CL 104  CO2 27  GLUCOSE 94  BUN 17  CREATININE 0.85  CALCIUM 8.3*  MG 1.8  AST 13*  ALT 13  ALKPHOS 53  BILITOT 0.5    Microbiology Results   Recent Results (from the past 240 hour(s))  Resp Panel by RT-PCR (Flu A&B, Covid) Nasopharyngeal Swab     Status: None    Collection Time: 09/14/20  2:22 PM   Specimen: Nasopharyngeal Swab; Nasopharyngeal(NP) swabs in vial transport medium  Result Value Ref Range Status   SARS Coronavirus 2 by RT PCR NEGATIVE NEGATIVE Final    Comment: (NOTE) SARS-CoV-2 target nucleic acids are NOT DETECTED.  The SARS-CoV-2 RNA is generally detectable in upper respiratory specimens during the acute phase of infection. The lowest concentration of SARS-CoV-2 viral copies this assay can detect is 138 copies/mL. A negative result does not preclude SARS-Cov-2 infection and should not be used as the sole basis for treatment or other patient management decisions. A negative result may occur with  improper specimen collection/handling, submission of specimen other than nasopharyngeal swab, presence of viral mutation(s) within the areas targeted by this assay, and inadequate number of viral copies(<138 copies/mL). A negative result must be combined with clinical observations, patient history, and epidemiological information. The expected result is Negative.  Fact Sheet for Patients:  EntrepreneurPulse.com.au  Fact Sheet for Healthcare Providers:  IncredibleEmployment.be  This test is no t yet approved or cleared by the [place] FDA and  has been authorized for detection and/or diagnosis of SARS-CoV-2 by FDA under an Emergency Use Authorization (EUA). This EUA will remain  in effect (meaning this test can be used) for the duration of the COVID-19 declaration under Section 564(b)(1) of the Act, 21 U.S.C.section 360bbb-3(b)(1), unless the authorization is terminated  or revoked sooner.       Influenza A by PCR NEGATIVE NEGATIVE Final   Influenza B by PCR NEGATIVE NEGATIVE Final    Comment: (NOTE) The Xpert Xpress SARS-CoV-2/FLU/RSV plus assay is intended as an aid in the diagnosis of influenza from Nasopharyngeal swab specimens and should not be used as a sole basis for treatment.  Nasal washings and aspirates are unacceptable for Xpert Xpress SARS-CoV-2/FLU/RSV testing.  Fact Sheet for Patients: EntrepreneurPulse.com.au  Fact Sheet for Healthcare Providers: IncredibleEmployment.be  This test is not yet approved or cleared by the [place] FDA and has been authorized for detection and/or diagnosis of SARS-CoV-2 by FDA under an Emergency Use Authorization (EUA). This EUA will remain in effect (meaning this test can be used) for the duration of the COVID-19 declaration under Section 564(b)(1) of the Act, 21 U.S.C. section 360bbb-3(b)(1), unless the authorization is terminated or revoked.  Performed at [facility], [address] [place], [place] [address]   Culture, blood (Routine X 2) w Reflex to ID Panel     Status: None (Preliminary result)   Collection Time: 09/14/20 11:49 PM   Specimen: BLOOD  Result Value Ref Range Status   Specimen Description BLOOD LEFT ANTECUBITAL  Final   Special Requests   Final    BOTTLES DRAWN AEROBIC AND ANAEROBIC Blood Culture adequate  volume   Culture   Final    NO GROWTH 4 DAYS Performed at [facility], [address] [place], [place] [address]    Report Status PENDING  Incomplete  Culture, blood (Routine X 2) w Reflex to ID Panel     Status: None (Preliminary result)   Collection Time: 09/14/20 11:49 PM   Specimen: BLOOD  Result Value Ref Range Status   Specimen Description BLOOD BLOOD LEFT HAND  Final   Special Requests   Final    BOTTLES DRAWN AEROBIC AND ANAEROBIC Blood Culture adequate volume   Culture   Final    NO GROWTH 4 DAYS Performed at [facility], [address], [place], [place] [address]    Report Status PENDING  Incomplete  MRSA PCR Screening     Status: None   Collection Time: 09/16/20 12:15 PM   Specimen: Nasopharyngeal  Result Value Ref Range Status   MRSA by PCR NEGATIVE NEGATIVE Final    Comment:        The GeneXpert  MRSA Assay (FDA approved for NASAL specimens only), is one component of a comprehensive MRSA colonization surveillance program. It is not intended to diagnose MRSA infection nor to guide or monitor treatment for MRSA infections. Performed at [facility], [address] [place], [place] [address]   Body fluid culture     Status: None (Preliminary result)   Collection Time: 09/17/20 11:50 AM   Specimen: PATH Cytology Pleural fluid  Result Value Ref Range Status   Specimen Description   Final    PLEURAL Performed at [facility], [address] [place], [place] [address]    Special Requests   Final    NONE Performed at [facility], [address] [place], [place] [address]    Gram Stain   Final    MODERATE WBC PRESENT,BOTH PMN AND MONONUCLEAR NO ORGANISMS SEEN    Culture   Final    NO GROWTH 2 DAYS Performed at [facility], [place] [address], [place], [place] [address]    Report Status PENDING  Incomplete    RADIOLOGY:  CT ABDOMEN PELVIS W CONTRAST  Result Date: 09/18/2020 CLINICAL DATA:  Newly diagnosed lung carcinoma.  Staging. EXAM: CT ABDOMEN AND PELVIS WITH CONTRAST TECHNIQUE: Multidetector CT imaging of the abdomen and pelvis was performed using the standard protocol following bolus administration of intravenous contrast. CONTRAST:  122mL OMNIPAQUE IOHEXOL 300 MG/ML  SOLN COMPARISON:  Recent chest CT on 09/15/2020 FINDINGS: Lower Chest: Numerous pulmonary nodules are again seen in the visualized portions of the lung bases as well as probable postobstructive atelectasis or pneumonitis in the medial right lower lobe, without significant change compared to recent chest CT. Hepatobiliary: No hepatic masses identified. A few small cysts are noted in right and left lobes. Gallbladder is unremarkable. No evidence of biliary ductal dilatation. Pancreas:  No mass or inflammatory changes. Spleen: Within normal limits in size and appearance.  Adrenals/Urinary Tract: No adrenal or renal masses identified. No evidence of ureteral calculi or hydronephrosis. Stomach/Bowel: No evidence of obstruction, inflammatory process or abnormal fluid collections. Vascular/Lymphatic: No pathologically enlarged lymph nodes. Infrarenal abdominal aortic aneurysm is seen measuring 5.6 cm in maximum diameter. No evidence of aneurysm leak or rupture. Reproductive:  No mass or other significant abnormality. Other:  None. Musculoskeletal:  No suspicious bone lesions identified. IMPRESSION: Pulmonary metastases in visualized portions of both lung bases. No evidence of abdominal or pelvic metastatic disease. 5.6 cm infrarenal  abdominal aortic aneurysm. Recommend referral to a vascular specialist. This recommendation follows ACR consensus guidelines: White Paper of the ACR Incidental Findings Committee II on Vascular Findings. J Am Coll Radiol 2013; 10:789-794. Electronically Signed   By: Marlaine Hind M.D.   On: 09/18/2020 11:40     CODE STATUS:     Code Status Orders  (From admission, onward)         Start     Ordered   09/14/20 2332  Full code  Continuous        09/14/20 2332        Code Status History    Date Active Date Inactive Code Status Order ID Comments User Context   01/02/2018 1723 01/14/2018 1739 Full Code [id number]  Clovis Fredrickson, MD Inpatient   Advance Care Planning Activity       TOTAL TIME TAKING CARE OF THIS PATIENT: *35* minutes.    Fritzi Mandes M.D  [facility]    CC: Primary care physician; Mar Daring, PA-C

## 2020-09-19 NOTE — TOC Transition Note (Addendum)
Transition of Care [facility]) - CM/SW Discharge Note   Patient Details  Name: Joseph Holt. MRN: [id number] Date of Birth: 1951-03-09  Transition of Care [facility] CM/SW Contact:  Magnus Ivan, LCSW Phone Number: 09/19/2020, 4:04 PM   Clinical Narrative:   Patient has orders to discharge home today. Home health already arranged by [place] for PT, RN, ST. Asked MD for orders. CSW called [facility]. Left voicemail for Group Home Owner. Called main number. Spoke to [place]. She reported the Group Home Owner would be able to come pick up patient this evening. Provided RN Sierra's number for Group Home Owner to call with pick up time. Updated RN.     Final next level of care: [place] Barriers to Discharge: Barriers Resolved   Patient Goals and CMS Choice        Discharge Placement                Patient to be transferred to facility by: [place]   Patient and family notified of of transfer: 09/19/20  Discharge Plan and Services                          HH Arranged: PT,RN,Speech Therapy HH Agency: [place] ([place]) Date [place]: 09/19/20   Representative spoke with at [place]: [place] ([place]) Interventions     Readmission Risk Interventions No flowsheet data found.

## 2020-09-19 NOTE — Progress Notes (Signed)
Joseph Holt. to be D/C'd to group home per MD order.  Discussed prescriptions and follow up appointments with the patient. Prescriptions given to patient, medication list explained in detail. Pt verbalized understanding.  Allergies as of 09/19/2020   No Known Allergies      Medication List     TAKE these medications    Advair Diskus 250-50 MCG/DOSE Aepb Generic drug: Fluticasone-Salmeterol INHALE 1 PUFF TWICE DAILY. What changed: how much to take   amantadine 100 MG capsule Commonly known as: SYMMETREL TAKE 1 CAPSULE BY MOUTH TWICE DAILY. **DO NOT CRUSH** What changed: See the new instructions.   cefdinir 300 MG capsule Commonly known as: OMNICEF Take 1 capsule (300 mg total) by mouth every 12 (twelve) hours.   cetirizine 10 MG tablet Commonly known as: ZYRTEC TAKE (1) TABLET BY MOUTH ONCE DAILY. What changed: See the new instructions.   chlorproMAZINE 25 MG tablet Commonly known as: THORAZINE TAKE 1 TABLET BY MOUTH 3 TIMES DAILY.   cyanocobalamin 1000 MCG tablet Take 1 tablet (1,000 mcg total) by mouth daily.   divalproex 500 MG DR tablet Commonly known as: DEPAKOTE TAKE 1 TABLET BY MOUTH TWICE DAILY. **DO NOT CRUSH** What changed: See the new instructions.   feeding supplement Liqd Take 237 mLs by mouth 3 (three) times daily between meals.   gabapentin 400 MG capsule Commonly known as: NEURONTIN TAKE 1 TABLET BY MOUTH EVERY MORNING. What changed:  how much to take when to take this   LORazepam 0.5 MG tablet Commonly known as: ATIVAN TAKE 1 TABLET BY MOUTH AT BEDTIME.   meloxicam 15 MG tablet Commonly known as: MOBIC TAKE (1) TABLET BY MOUTH ONCE DAILY. What changed: See the new instructions.   mirtazapine 30 MG tablet Commonly known as: REMERON TAKE 1 TABLET BY MOUTH AT BEDTIME.   Namzaric 28-10 MG Cp24 Generic drug: Memantine HCl-Donepezil HCl TAKE ONE CAPSULE ONCE A DAY. * DO NOT CRUSH * What changed: See the new instructions.    omeprazole 20 MG capsule Commonly known as: PRILOSEC TAKE ONE CAPSULE BY MOUTH EVERY MORNING *DO NOT CRUSH* What changed: See the new instructions.   QUEtiapine 300 MG 24 hr tablet Commonly known as: SEROQUEL XR TAKE 2 TABLETS BY MOUTH AT BEDTIME.   simvastatin 20 MG tablet Commonly known as: ZOCOR TAKE 1 TABLET BY MOUTH AT BEDTIME.   tamsulosin 0.4 MG Caps capsule Commonly known as: FLOMAX TAKE (1) CAPSULE BY MOUTH AT BEDTIME.*DO NOT CRUSH* What changed:  how much to take how to take this when to take this additional instructions   Vascepa 1 g capsule Generic drug: icosapent Ethyl TAKE 2 CAPSULES BY MOUTH TWO TIMES A DAY What changed: See the new instructions.   vitamin C 1000 MG tablet TAKE ONE TABLET BY MOUTH EVERY DAY        Vitals:   09/19/20 0843 09/19/20 1108  BP: 126/83 119/76  Pulse: (!) 107 98  Resp: 20 16  Temp: (!) 97.5 F (36.4 C) 98.2 F (36.8 C)  SpO2: 94% 90%    Skin clean, dry and intact without evidence of skin break down, no evidence of skin tears noted. IV catheter discontinued intact. Site without signs and symptoms of complications. Dressing and pressure applied. Pt denies pain at this time. No complaints noted.  An After Visit Summary was printed and given to the patient. Patient escorted via [place], and D/C home via private auto.  Joseph Holt

## 2020-09-19 NOTE — NC FL2 (Signed)
[place] LEVEL OF CARE SCREENING TOOL     IDENTIFICATION  Patient Name: Joseph Holt. Birthdate: 03/08/1951 Sex: male Admission Date (Current Location): 09/14/2020  [place] and [place] Number:  Engineering geologist and Address:  [facility], [address], [place], [place] [address]      Provider Number: [id number]  Attending Physician Name and Address:  Fritzi Mandes, MD  Relative Name and Phone Number:  Kruze, At[facility] [facility])   [phone]    Current Level of Care: Other (Comment) ([facility]) Recommended Level of Care: Other (Comment) (Group Home) Prior Approval Number:    Date Approved/Denied:   PASRR Number:    Discharge Plan: Other (Comment) ([facility])    Current Diagnoses: Patient Active Problem List   Diagnosis Date Noted  . S/P thoracentesis   . Palliative care by specialist   . Goals of care, counseling/discussion   . [name] 09/16/2020  . Metastatic primary lung cancer, left ([place])   . Benign prostatic hyperplasia without lower urinary tract symptoms   . COPD with acute exacerbation ([place])   . Acute respiratory failure with hypoxia ([place])   . Hyperlipidemia   . Multifocal pneumonia 09/14/2020  . Shortness of breath 09/14/2020  . Generalized weakness 09/14/2020  . GERD (gastroesophageal reflux disease) 09/14/2020  . Mild protein-calorie malnutrition ([place]) 12/06/2018  . Mixed simple and mucopurulent chronic bronchitis ([place]) 12/06/2018  . Sleep disturbance 09/06/2018  . Bipolar 1 disorder ([place]) 04/14/2018  . Schizo affective schizophrenia ([place]) 04/14/2018  . Cognitive impairment 01/06/2018  . Tobacco use disorder 01/02/2018  . Paranoid schizophrenia ([place]) 02/21/2015  . Social maladjustment 02/21/2015    Orientation RESPIRATION BLADDER Height & Weight     Self,Time,Situation,Place  Normal Continent Weight: 126 lb 12.2 oz (57.5 kg) Height:  6' (182.9 cm)   BEHAVIORAL SYMPTOMS/MOOD NEUROLOGICAL BOWEL NUTRITION STATUS      Continent Diet (Regular diet)  AMBULATORY STATUS COMMUNICATION OF NEEDS Skin   Limited Assist Verbally Normal                       Personal Care Assistance Level of Assistance  Bathing,Feeding,Dressing Bathing Assistance: Limited assistance Feeding assistance: Independent Dressing Assistance: Limited assistance     Functional Limitations Info  Sight,Speech,Hearing Sight Info: Adequate Hearing Info: Adequate Speech Info: Adequate    SPECIAL CARE FACTORS FREQUENCY  Speech therapy,OT (By licensed OT),PT (By licensed PT)                    Contractures      Additional Factors Info  Allergies,Code Status Code Status Info: FULL Allergies Info: None Known           Current Medications (09/19/2020):  This is the current hospital active medication list Current Facility-Administered Medications  Medication Dose Route Frequency Provider Last Rate Last Admin  . 0.9 %  sodium chloride infusion   Intravenous PRN Howerter, Justin B, DO 10 mL/hr at 09/18/20 1602 1,000 mL at 09/18/20 1602  . acetaminophen (TYLENOL) tablet 650 mg  650 mg Oral Q6H PRN Howerter, Justin B, DO   650 mg at 09/18/20 1515   Or  . acetaminophen (TYLENOL) suppository 650 mg  650 mg Rectal Q6H PRN Howerter, Justin B, DO      . amantadine (SYMMETREL) capsule 100 mg  100 mg Oral BID Howerter, Justin B, DO   100 mg at 09/19/20 0853  .  cefdinir (OMNICEF) capsule 300 mg  300 mg Oral Q12H Fritzi Mandes, MD      . chlorproMAZINE (THORAZINE) tablet 25 mg  25 mg Oral TID Howerter, Justin B, DO   25 mg at 09/19/20 0852  . divalproex (DEPAKOTE) DR tablet 500 mg  500 mg Oral BID Howerter, Justin B, DO   500 mg at 09/19/20 0852  . memantine (NAMENDA XR) 24 hr capsule 28 mg  28 mg Oral Daily Loletha Grayer, MD   28 mg at 09/19/20 2505   And  . donepezil (ARICEPT) tablet 10 mg  10 mg Oral Daily Loletha Grayer, MD   10 mg at 09/19/20 0853  .  feeding supplement (ENSURE ENLIVE / ENSURE PLUS) liquid 237 mL  237 mL Oral TID BM Fritzi Mandes, MD      . fluticasone furoate-vilanterol (BREO ELLIPTA) 200-25 MCG/INH 1 puff  1 puff Inhalation Daily Howerter, Justin B, DO   1 puff at 09/18/20 0837  . gabapentin (NEURONTIN) capsule 400 mg  400 mg Oral Daily Loletha Grayer, MD   400 mg at 09/19/20 0852  . icosapent Ethyl (VASCEPA) 1 g capsule 2 g  2 g Oral BID Renda Rolls, RPH   2 g at 09/18/20 2300  . LORazepam (ATIVAN) tablet 0.5 mg  0.5 mg Oral QHS Howerter, Justin B, DO   0.5 mg at 09/18/20 2057  . methylPREDNISolone sodium succinate (SOLU-MEDROL) 40 mg/mL injection 40 mg  40 mg Intravenous Daily Loletha Grayer, MD   40 mg at 09/19/20 0847  . mirtazapine (REMERON) tablet 30 mg  30 mg Oral QHS Loletha Grayer, MD   30 mg at 09/18/20 2057  . [START ON 09/20/2020] multivitamin with minerals tablet 1 tablet  1 tablet Oral Daily Fritzi Mandes, MD      . pantoprazole (PROTONIX) EC tablet 40 mg  40 mg Oral Daily Howerter, Justin B, DO   40 mg at 09/19/20 0852  . QUEtiapine (SEROQUEL XR) 24 hr tablet 600 mg  600 mg Oral QHS Howerter, Justin B, DO   600 mg at 09/18/20 2057  . simvastatin (ZOCOR) tablet 20 mg  20 mg Oral QHS Howerter, Justin B, DO   20 mg at 09/18/20 2057  . tamsulosin (FLOMAX) capsule 0.4 mg  0.4 mg Oral QHS Howerter, Justin B, DO   0.4 mg at 09/18/20 2057  . vitamin B-12 (CYANOCOBALAMIN) tablet 1,000 mcg  1,000 mcg Oral Daily Loletha Grayer, MD   1,000 mcg at 09/19/20 3976     Discharge Medications: Please see discharge summary for a list of discharge medications.  Relevant Imaging Results:  Relevant Lab Results:   Additional Information SSN: [id number] [place]

## 2020-09-19 NOTE — Progress Notes (Signed)
Joseph Holt.   DOB:1951-06-21   NI#:[id number]    Subjective: No acute events overnight.  No fevers.  No new shortness of breath or cough.   Objective:  Vitals:   09/19/20 0843 09/19/20 1108  BP: 126/83 119/76  Pulse: (!) 107 98  Resp: 20 16  Temp: (!) 97.5 F (36.4 C) 98.2 F (36.8 C)  SpO2: 94% 90%     Intake/Output Summary (Last 24 hours) at 09/19/2020 1942 Last data filed at 09/19/2020 1600 Gross per 24 hour  Intake 720 ml  Output 1200 ml  Net -480 ml    Physical Exam Constitutional:      Comments: Thin cachectic male patient.  No acute distress.  HENT:     Head: Normocephalic and atraumatic.     Mouth/Throat:     Mouth: Oropharynx is clear and moist.     Pharynx: No oropharyngeal exudate.  Eyes:     Pupils: Pupils are equal, round, and reactive to light.  Cardiovascular:     Rate and Rhythm: Normal rate and regular rhythm.  Pulmonary:     Effort: No respiratory distress.     Breath sounds: No wheezing.     Comments: Decreased breath sounds bilaterally.  Abdominal:     General: Bowel sounds are normal. There is no distension.     Palpations: Abdomen is soft. There is no mass.     Tenderness: There is no abdominal tenderness. There is no guarding or rebound.  Musculoskeletal:        General: No tenderness or edema. Normal range of motion.     Cervical back: Normal range of motion and neck supple.  Skin:    General: Skin is warm.  Neurological:     Mental Status: He is alert and oriented to person, place, and time.  Psychiatric:        Mood and Affect: Affect normal.      Labs:  Lab Results  Component Value Date   WBC 7.6 09/15/2020   HGB 10.5 (L) 09/15/2020   HCT 32.8 (L) 09/15/2020   MCV 91.9 09/15/2020   PLT 221 09/15/2020   NEUTROABS 5.0 09/15/2020    Lab Results  Component Value Date   NA 138 09/15/2020   K 3.9 09/15/2020   CL 104 09/15/2020   CO2 27 09/15/2020    Studies:  CT ABDOMEN PELVIS W CONTRAST  Result Date:  09/18/2020 CLINICAL DATA:  Newly diagnosed lung carcinoma.  Staging. EXAM: CT ABDOMEN AND PELVIS WITH CONTRAST TECHNIQUE: Multidetector CT imaging of the abdomen and pelvis was performed using the standard protocol following bolus administration of intravenous contrast. CONTRAST:  160mL OMNIPAQUE IOHEXOL 300 MG/ML  SOLN COMPARISON:  Recent chest CT on 09/15/2020 FINDINGS: Lower Chest: Numerous pulmonary nodules are again seen in the visualized portions of the lung bases as well as probable postobstructive atelectasis or pneumonitis in the medial right lower lobe, without significant change compared to recent chest CT. Hepatobiliary: No hepatic masses identified. A few small cysts are noted in right and left lobes. Gallbladder is unremarkable. No evidence of biliary ductal dilatation. Pancreas:  No mass or inflammatory changes. Spleen: Within normal limits in size and appearance. Adrenals/Urinary Tract: No adrenal or renal masses identified. No evidence of ureteral calculi or hydronephrosis. Stomach/Bowel: No evidence of obstruction, inflammatory process or abnormal fluid collections. Vascular/Lymphatic: No pathologically enlarged lymph nodes. Infrarenal abdominal aortic aneurysm is seen measuring 5.6 cm in maximum diameter. No evidence of aneurysm leak or rupture. Reproductive:  No mass or other significant abnormality. Other:  None. Musculoskeletal:  No suspicious bone lesions identified. IMPRESSION: Pulmonary metastases in visualized portions of both lung bases. No evidence of abdominal or pelvic metastatic disease. 5.6 cm infrarenal abdominal aortic aneurysm. Recommend referral to a vascular specialist. This recommendation follows ACR consensus guidelines: White Paper of the ACR Incidental Findings Committee II on Vascular Findings. J Am Coll Radiol 2013; 10:789-794. Electronically Signed   By: Marlaine Hind M.D.   On: 09/18/2020 11:40    [name] #69 year old male patient with a  history of schizophrenia/bipolar group home resident is currently admitted to hospital for worsening shortness of breath/cough; CT scan shows multifocal pneumonia; also left upper lobe mass; mediastinal adenopathy and bilateral lung nodules  #Left upper lobe mass; mediastinal adenopathy; bilateral lung nodules-highly suspicious for lung primary malignancy with metastatic disease to the contralateral lung-status post thoracentesis.  Awaiting cytology.  CT scan abdomen pelvis negative for any targettable lesions.  If cytology is negative-recommend alternative procedure like endoscopic bronchial ultrasound with biopsy.  Discussed with Dr.Aleskerov-this could be done outpatient.  #Multifocal pneumonia-on antibiotics  #Psychiatric illness: Schizophrenia/bipolar disorder s/p evaluation with psychiatry-patient deemed competent.  Discussed with Dr. Posey Pronto.    Cammie Sickle, MD 09/19/2020  7:42 PM

## 2020-09-19 NOTE — Progress Notes (Signed)
SLP Cancellation Note  Patient Details Name: Joseph Holt. MRN: [id number] DOB: [birth year]   Cancelled treatment:       Reason Eval/Treat Not Completed:  (chart reviewed; consulted NSG and met w/ pt). Consulted NSG today re: pt's toleration of diet - Dysphagia level 2 w/ thin liquids (foods Minced for ease of mastication/swallowing and conservation of energy d/t pt's overall weakness). Per chart review, pt with fair appetite and oral intake in hospital; pt eating anywhere from 50-100% of meals. He would benefit from a drink supplement(form) for ease of support. Noted RD mentioned pt's recent weight loss of 49lbs in 17mo. Recommend continue this current diet consistency if easier for pt's oral intake; especially d/t Edentulous status. Recommend pills in puree and general aspiration precautions.  As NSG reported good progress today, no skilled ST services indicated at this time. Will continue to monitor for any acute needs while admitted. MD/NSG/SW updated.     KOrinda Kenner MS, CCC-SLP Speech Language Pathologist Rehab Services [phone]12/15/2021, 3:31 PM

## 2020-09-19 NOTE — Clinical Social Work Note (Signed)
CSW informed pt was in need of completed Fl2 in order for group home to take patient at D/C. CSW completed Fl2. CSW updated RN Passenger transport manager that once Phoebe Perch has been signed by Dr. Posey Pronto, RN can print and provide to group home. TOC signing off.

## 2020-09-20 ENCOUNTER — Telehealth: Payer: Self-pay

## 2020-09-20 DIAGNOSIS — J90 Pleural effusion, not elsewhere classified: Secondary | ICD-10-CM | POA: Diagnosis not present

## 2020-09-20 LAB — CULTURE, BLOOD (ROUTINE X 2)
Culture: NO GROWTH
Culture: NO GROWTH
Special Requests: ADEQUATE
Special Requests: ADEQUATE

## 2020-09-20 NOTE — Telephone Encounter (Signed)
Transition Care Management Follow-up Telephone Call  Date of discharge and from where: [facility] on 09/19/20  How have you been since you were released from the hospital? Doing good and feels better. Pt is still having SOB but no better or worse. Pt is coughing but no seeing any sputum. Pt states he slept good last night and his appetite is normal. Declines pain, fever, congestion or n/v/d.  Any questions or concerns? No   Items Reviewed:  Did the pt receive and understand the discharge instructions provided? Yes   Medications obtained and verified? Yes   Any new allergies since your discharge? No   Dietary orders reviewed? Yes  Do you have support at home? Yes, lives at [facility].  Other (ie: DME, Home Health, etc): Orders discussed for pallative care and HHPT.  Functional Questionnaire: (I = Independent and D = Dependent)  Bathing/Dressing- I   Meal Prep- D, care home prepares meals.  Eating- I  Maintaining continence- I  Transferring/Ambulation- I  Managing Meds- I   Follow up appointments reviewed:    PCP Hospital f/u appt confirmed? Yes  scheduled to see Fenton Malling on 09/26/20 @ 2:40 PM.  [facility] f/u appt confirmed? Yes    Are transportation arrangements needed? No   If their condition worsens, is the pt aware to call  their PCP or go to the ED? Yes  Was the patient provided with contact information for the PCP's office or ED? Yes  Was the pt encouraged to call back with questions or concerns? Yes

## 2020-09-20 NOTE — Telephone Encounter (Signed)
HFU scheduled for 09/26/20.

## 2020-09-21 DIAGNOSIS — J189 Pneumonia, unspecified organism: Secondary | ICD-10-CM | POA: Diagnosis not present

## 2020-09-21 LAB — BODY FLUID CULTURE: Culture: NO GROWTH

## 2020-09-21 LAB — CYTOLOGY - NON PAP

## 2020-09-24 ENCOUNTER — Telehealth: Payer: Self-pay | Admitting: Physician Assistant

## 2020-09-24 ENCOUNTER — Telehealth: Payer: Self-pay | Admitting: *Deleted

## 2020-09-24 ENCOUNTER — Encounter: Payer: Self-pay | Admitting: *Deleted

## 2020-09-24 DIAGNOSIS — R918 Other nonspecific abnormal finding of lung field: Secondary | ICD-10-CM

## 2020-09-24 SURGERY — BRONCHOSCOPY, FLEXIBLE
Anesthesia: General | Laterality: Bilateral

## 2020-09-24 NOTE — Telephone Encounter (Signed)
Graciella Freer, patient brother called requesting Dr B call him with results of tests done on lungs to help patient make a treatment decision [phone].

## 2020-09-24 NOTE — Telephone Encounter (Signed)
Hayley- please reach out to pt's brother with the plan for PET scan; appt with Dr.Aleskerov. I will also plan to reach out to the brother later in the day. Also, watch out the results of the PET scan; and you could talk to covering MDs [in my absence]; and  make appt with me after the results of the biopsy.   GB

## 2020-09-24 NOTE — Telephone Encounter (Signed)
Home Health Verbal Orders - Caller/Agency: [facility]  [phone]  Requesting OT/PT/Skilled Nursing/Social Work/Speech Therapy: Skilled Nursing 1 week 1, 2 week 1, and 1 week 6

## 2020-09-24 NOTE — Telephone Encounter (Signed)
Phone call made to Joseph Holt to answer all questions. Informed to expect call from Dr. B as well later today. Contact info given to him and instructed to call back with any further questions or needs. Joseph Holt verbalized understanding.

## 2020-09-24 NOTE — Telephone Encounter (Signed)
Ok to give verbal approval

## 2020-09-24 NOTE — Progress Notes (Signed)
  Oncology Nurse Navigator Documentation  Navigator Location: [facility] (09/24/20 1300)   )Navigator Encounter Type: Telephone (09/24/20 1300) Telephone: Outgoing Call (09/24/20 1300) Abnormal Finding Date: 09/14/20 (09/24/20 1300)                   Treatment Phase: Abnormal Scans (09/24/20 1300) Barriers/Navigation Needs: Coordination of Care (09/24/20 1300)   Interventions: Coordination of Care (09/24/20 1300)   Coordination of Care: Appts;Broncoscopy;Radiology (09/24/20 1300)       phone call made to pt's caregiver, Izora Gala, to review upcoming appts and plan of care at this time. Informed that Dr. Jacinto Reap would like for pt to have PET scan prior to outpatient visit with Dr. Lanney Gins on 1/6. PET scan scheduled for 12/29 at 1130, arrival at 11am. Encouraged to keep appts as scheduled and our office would let her know when pt will need follow up with Dr. B after next biopsy. Contact info given and instructed to call with any questions or needs.            Time Spent with Patient: 30 (09/24/20 1300)

## 2020-09-25 ENCOUNTER — Ambulatory Visit: Payer: Self-pay | Admitting: *Deleted

## 2020-09-25 ENCOUNTER — Telehealth: Payer: Self-pay | Admitting: Internal Medicine

## 2020-09-25 ENCOUNTER — Telehealth: Payer: Self-pay | Admitting: Physician Assistant

## 2020-09-25 NOTE — Telephone Encounter (Signed)
Call received from Koren Shiver, RN home health [phone]. RN no  Longer with patient at this time. RN, Norma Fredrickson reports patient sleepy, but was alert and awake to answer questions. Noted poor po intake and noted O 2 levels would not come up above 88% on room air. RN, Tiana Loft reports patient recently discharged form hospital and has f/u appt scheduled with PCP tomorrow. No oxygen was sent home with patient. Recent diagnosed with pneumonia. Reported congested cough, and coughing with deep breathing. Called Group Home and spoke with patient's caregiver Izora Gala. Izora Gala reports patient has been sleeping a lot and coughing up greenish-yellow sputum to RN, Tiana Loft. Izora Gala reports patient noted "rattling" when breathing but has not noticed difficulty breathing. Izora Gala reported patient only drinking ensure , water gets patient "strangled". Call placed to [facility] and spoke to [facility] and Joette Catching PA aware of patient O 2 levels. Care advise given to [facility], caregiver. Izora Gala verbalized understanding or care advise and to call back or call for patient to go back to ED if symptoms worsen.   Reason for Disposition  Nurse judgment  Answer Assessment - Initial Assessment Questions 1. MAIN CONCERN OR SYMPTOM: "What's your main concern?" (e.g., low oxygen level, breathing difficulty) "What question do you have?"    Caller is not with patient now but reports, Patient's O2 level did not go above 88% on room air during home health visit. 2. ONSET: "When did the  na  start?"      na 3. OXYGEN THERAPY:    - "Do you currently use home oxygen?"    - If Yes, ask: "What is your oxygen source?" (e.g., O2 tank, O2 concentrator).    - If Yes, ask: "How do you get the oxygen?" (e.g., nasal prongs, face mask).    - If Yes, ask: "How much oxygen are you supposed to use?" (e.g., 1-2 L [place])     Patient was not sent home with oxygen 4.  OXYGEN EQUIPMENT:  "Are you having any trouble with your oxygen equipment?"  (e.g., cannula,  mask, tubing, tank, concentrator)     na 5. PULSE OXIMETER:    - "Do you have a pulse oximeter (pulse ox)?"     - If Yes, ask: "Where do you place the probe?" (e.g., fingertip, ear lobe)     Patient does not have oximeter 6. O2 MONITORING: "What is the oxygen level (pulse ox reading)?" (e.g., 70-100%)     88% on room air 7. VSS MONITORING: "Do you monitor/measure your oxygen level or vital signs?" (e.g., yes, no, measurements are automatically sent to provider/call center). Document CURRENT and NORMAL BASELINE values if available.     -  P: "What is your pulse rate per minute?"   -  RR: "What is your respiratory rate per minute?"     Did not report 8. BREATHING DIFFICULTY: "Are you having any difficulty breathing?" If Yes, ask: "How bad is it?"  (e.g., none, mild, moderate, severe)    - MILD: No SOB at rest, mild SOB with walking, speaks normally in sentences, able to lie down, no retractions, pulse < 100.    - MODERATE: SOB at rest, SOB with minimal exertion and prefers to sit, cannot lie down flat, speaks in phrases, mild retractions, audible wheezing, pulse 100-120.    - SEVERE: Very SOB at rest, speaks in single words, struggling to breathe, sitting hunched forward, retractions, pulse > 120      Caller reported patient as sleepy, coughing and congested.  9. OTHER SYMPTOMS: "Do you have any other symptoms?" (e.g., fever, change in sputum)     Caller reports caregiver reports patient coughing up greenish-yellow sputum 10. SMOKING: "Do you smoke currently?" "Is there anyone that smokes around you?"  (Note: smoking around oxygen is dangerous!)       na  Protocols used: OXYGEN MONITORING AND HYPOXIA-A-AH

## 2020-09-25 NOTE — Telephone Encounter (Signed)
He was not sent home on O2

## 2020-09-25 NOTE — Telephone Encounter (Signed)
Left detailed message approving orders.

## 2020-09-25 NOTE — Telephone Encounter (Signed)
He actually has more than just pneumonia, it appears he may also have lung cancer. If he is able to make it here tomorrow we can try to get him oxygen set up. He will need to also make sure to keep appointments with pulmonology and oncology outpatient as well.   If his oxygen drops below 86 overnight and will not come up he will need to go back to the ER.

## 2020-09-25 NOTE — Telephone Encounter (Signed)
Joseph Holt advised as below.

## 2020-09-25 NOTE — Telephone Encounter (Signed)
Is he

## 2020-09-25 NOTE — Telephone Encounter (Signed)
On 12/20-I spoke to patient's brother-Brandon in [place] at [phone].  Regarding the plan of care-awaiting PET scan; then decide on biopsy.  Awaiting appointment with Dr.Aleskerov.   Patient's family appreciates the phone call from [place].  GB
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
## 2020-09-25 NOTE — Telephone Encounter (Signed)
Joseph Holt from Home health called today and stated Oxygen level wont stay above 88 at rest /Pt has an appt tomorrow / please advise

## 2020-09-26 ENCOUNTER — Other Ambulatory Visit: Payer: Self-pay

## 2020-09-26 ENCOUNTER — Encounter: Payer: Self-pay | Admitting: Physician Assistant

## 2020-09-26 ENCOUNTER — Ambulatory Visit (INDEPENDENT_AMBULATORY_CARE_PROVIDER_SITE_OTHER): Payer: Medicare Other | Admitting: Physician Assistant

## 2020-09-26 VITALS — BP 98/67 | HR 102 | Temp 97.9°F | Resp 24 | Ht 72.0 in | Wt 161.0 lb

## 2020-09-26 DIAGNOSIS — E441 Mild protein-calorie malnutrition: Secondary | ICD-10-CM

## 2020-09-26 DIAGNOSIS — R918 Other nonspecific abnormal finding of lung field: Secondary | ICD-10-CM | POA: Diagnosis not present

## 2020-09-26 DIAGNOSIS — Z7689 Persons encountering health services in other specified circumstances: Secondary | ICD-10-CM

## 2020-09-26 DIAGNOSIS — J441 Chronic obstructive pulmonary disease with (acute) exacerbation: Secondary | ICD-10-CM | POA: Diagnosis not present

## 2020-09-26 MED ORDER — ALBUTEROL SULFATE HFA 108 (90 BASE) MCG/ACT IN AERS: 2.0000 | INHALATION_SPRAY | Freq: Four times a day (QID) | RESPIRATORY_TRACT | 5 refills | Status: DC | PRN

## 2020-09-26 MED ORDER — PULSE OXIMETER FOR FINGER MISC
0 refills | Status: AC
Start: 2020-09-26 — End: ?

## 2020-09-26 MED ORDER — TRELEGY ELLIPTA 100-62.5-25 MCG/INH IN AEPB: 1.0000 | INHALATION_SPRAY | Freq: Every day | RESPIRATORY_TRACT | 5 refills | Status: DC

## 2020-09-26 NOTE — Progress Notes (Signed)
Established patient visit   Patient: Joseph Holt.   DOB: May 01, 1951   69 y.o. Male  MRN: [id number] Visit Date: 09/26/2020  Today's healthcare provider: Mar Daring, PA-C   Chief Complaint  Patient presents with  . Hospitalization Follow-up   Subjective    HPI  Follow up Hospitalization  Patient was admitted to [facility] on 09/14/2020 and discharged on 09/19/2020. He was treated for shortness of breath, cough. Patient found to have CAP with possible lung mass concerning for carcinoma. Treatment for this included Omnicef 300mg  BID. Patient's caregiver reports that he just got this medication from the pharmacy this morning. Telephone follow up was done on 09/20/2020. He reports fair compliance with treatment. He reports this condition is stayed the same. He is having cough and SOB. O2 sat is staying in the high 80s.   Patient Active Problem List   Diagnosis Date Noted  . S/P thoracentesis   . Palliative care by specialist   . Goals of care, counseling/discussion   . [name] 09/16/2020  . Metastatic primary lung cancer, left ([place])   . Benign prostatic hyperplasia without lower urinary tract symptoms   . COPD with acute exacerbation ([place])   . Acute respiratory failure with hypoxia ([place])   . Hyperlipidemia   . Multifocal pneumonia 09/14/2020  . Shortness of breath 09/14/2020  . Generalized weakness 09/14/2020  . GERD (gastroesophageal reflux disease) 09/14/2020  . Mild protein-calorie malnutrition ([place]) 12/06/2018  . Mixed simple and mucopurulent chronic bronchitis ([place]) 12/06/2018  . Sleep disturbance 09/06/2018  . Bipolar 1 disorder ([place]) 04/14/2018  . Schizo affective schizophrenia ([place]) 04/14/2018  . Cognitive impairment 01/06/2018  . Tobacco use disorder 01/02/2018  . Paranoid schizophrenia ([place]) 02/21/2015  . Social maladjustment 02/21/2015   Past Medical History:  Diagnosis Date  . Bipolar 1 disorder ([place])   . Schizo  affective schizophrenia ([place])        Medications: Outpatient Medications Prior to Visit  Medication Sig  . amantadine (SYMMETREL) 100 MG capsule TAKE 1 CAPSULE BY MOUTH TWICE DAILY. **DO NOT CRUSH** (Patient taking differently: Take 100 mg by mouth 2 (two) times daily.)  . Ascorbic Acid (VITAMIN C) 1000 MG tablet TAKE ONE TABLET BY MOUTH EVERY DAY (Patient taking differently: Take 1,000 mg by mouth daily.)  . cefdinir (OMNICEF) 300 MG capsule Take 1 capsule (300 mg total) by mouth every 12 (twelve) hours. (Patient not taking: Reported on 09/20/2020)  . cetirizine (ZYRTEC) 10 MG tablet TAKE (1) TABLET BY MOUTH ONCE DAILY. (Patient taking differently: Take 10 mg by mouth daily.)  . chlorproMAZINE (THORAZINE) 25 MG tablet TAKE 1 TABLET BY MOUTH 3 TIMES DAILY. (Patient taking differently: Take 25 mg by mouth 3 (three) times daily.)  . cyanocobalamin 1000 MCG tablet Take 1 tablet (1,000 mcg total) by mouth daily.  . divalproex (DEPAKOTE) 500 MG DR tablet TAKE 1 TABLET BY MOUTH TWICE DAILY. **DO NOT CRUSH** (Patient taking differently: Take 500 mg by mouth 2 (two) times daily.)  . feeding supplement (ENSURE ENLIVE / ENSURE PLUS) LIQD Take 237 mLs by mouth 3 (three) times daily between meals. (Patient not taking: Reported on 09/20/2020)  . gabapentin (NEURONTIN) 400 MG capsule TAKE 1 TABLET BY MOUTH EVERY MORNING. (Patient taking differently: Take 400 mg by mouth daily.)  . LORazepam (ATIVAN) 0.5 MG tablet TAKE 1 TABLET BY MOUTH AT BEDTIME. (Patient taking differently: Take 0.5 mg by mouth at bedtime.)  . meloxicam (MOBIC)  15 MG tablet TAKE (1) TABLET BY MOUTH ONCE DAILY. (Patient taking differently: Take 15 mg by mouth daily.)  . mirtazapine (REMERON) 30 MG tablet TAKE 1 TABLET BY MOUTH AT BEDTIME. (Patient taking differently: Take 30 mg by mouth at bedtime.)  . NAMZARIC 28-10 MG CP24 TAKE ONE CAPSULE ONCE A DAY. * DO NOT CRUSH * (Patient taking differently: Take 1 capsule by mouth daily.)  .  omeprazole (PRILOSEC) 20 MG capsule TAKE ONE CAPSULE BY MOUTH EVERY MORNING *DO NOT CRUSH* (Patient taking differently: Take 20 mg by mouth daily.)  . QUEtiapine (SEROQUEL XR) 300 MG 24 hr tablet TAKE 2 TABLETS BY MOUTH AT BEDTIME. (Patient taking differently: Take 600 mg by mouth at bedtime.)  . simvastatin (ZOCOR) 20 MG tablet TAKE 1 TABLET BY MOUTH AT BEDTIME. (Patient taking differently: Take 20 mg by mouth at bedtime.)  . tamsulosin (FLOMAX) 0.4 MG CAPS capsule TAKE (1) CAPSULE BY MOUTH AT BEDTIME.*DO NOT CRUSH* (Patient taking differently: Take 0.4 mg by mouth at bedtime.)  . VASCEPA 1 g capsule TAKE 2 CAPSULES BY MOUTH TWO TIMES A DAY (Patient taking differently: Take 2 g by mouth 2 (two) times daily.)  . [DISCONTINUED] ADVAIR DISKUS 250-50 MCG/DOSE AEPB INHALE 1 PUFF TWICE DAILY. (Patient taking differently: Inhale into the lungs 2 (two) times daily.)   No facility-administered medications prior to visit.    Review of Systems  Constitutional: Positive for fatigue.  Respiratory: Positive for cough, shortness of breath and wheezing. Negative for chest tightness.   Cardiovascular: Negative for chest pain, palpitations and leg swelling.  Gastrointestinal: Negative.   Musculoskeletal: Negative for arthralgias and myalgias.  Neurological: Positive for dizziness. Negative for syncope and weakness.    Last CBC Lab Results  Component Value Date   WBC 7.6 09/15/2020   HGB 10.5 (L) 09/15/2020   HCT 32.8 (L) 09/15/2020   MCV 91.9 09/15/2020   MCH 29.4 09/15/2020   RDW 15.8 (H) 09/15/2020   PLT 221 38/75/6433   Last metabolic panel Lab Results  Component Value Date   GLUCOSE 94 09/15/2020   NA 138 09/15/2020   K 3.9 09/15/2020   CL 104 09/15/2020   CO2 27 09/15/2020   BUN 17 09/15/2020   CREATININE 0.85 09/15/2020   GFRNONAA >60 09/15/2020   GFRAA 79 01/02/2020   CALCIUM 8.3 (L) 09/15/2020   PHOS 3.5 09/15/2020   PROT 5.6 (L) 09/15/2020   ALBUMIN 2.3 (L) 09/15/2020   LABGLOB  2.5 01/02/2020   AGRATIO 1.8 01/02/2020   BILITOT 0.5 09/15/2020   ALKPHOS 53 09/15/2020   AST 13 (L) 09/15/2020   ALT 13 09/15/2020   ANIONGAP 7 09/15/2020      Objective    BP 98/67   Pulse (!) 102   Temp 97.9 F (36.6 C)   Resp (!) 24   Ht 6' (1.829 m)   Wt 161 lb (73 kg)   SpO2 (!) 88% Comment: walking pulse ox 83%  BMI 21.84 kg/m     BP Readings from Last 3 Encounters:  09/26/20 98/67  09/19/20 119/76  01/02/20 114/71   Wt Readings from Last 3 Encounters:  09/26/20 161 lb (73 kg)  09/19/20 126 lb 12.2 oz (57.5 kg)  01/02/20 176 lb 1.6 oz (79.9 kg)   Pulse oximetry on room air is 88% at rest.    Physical Exam Vitals reviewed.  Constitutional:      General: He is not in acute distress.    Appearance: Normal appearance. He is well-developed  and well-nourished. He is not ill-appearing or diaphoretic.  HENT:     Head: Normocephalic and atraumatic.     Right Ear: Hearing, tympanic membrane, ear canal and external ear normal. No middle ear effusion. Tympanic membrane is not erythematous or bulging.     Left Ear: Hearing, tympanic membrane, ear canal and external ear normal.  No middle ear effusion. Tympanic membrane is not erythematous or bulging.     Mouth/Throat:     Mouth: Oropharynx is clear and moist and mucous membranes are normal.     Pharynx: Uvula midline. No oropharyngeal exudate, posterior oropharyngeal edema or posterior oropharyngeal erythema.  Eyes:     General:        Right eye: No discharge.        Left eye: No discharge.     Extraocular Movements: EOM normal.     Conjunctiva/sclera: Conjunctivae normal.     Pupils: Pupils are equal, round, and reactive to light.  Neck:     Thyroid: No thyromegaly.     Trachea: No tracheal deviation.     Meningeal: Brudzinski's sign and Kernig's sign absent.  Cardiovascular:     Rate and Rhythm: Normal rate and regular rhythm.     Heart sounds: Normal heart sounds. No murmur heard. No friction rub. No  gallop.   Pulmonary:     Effort: Pulmonary effort is normal. No respiratory distress.     Breath sounds: Decreased air movement present. No stridor. Examination of the left-upper field reveals decreased breath sounds. Examination of the left-lower field reveals decreased breath sounds. Decreased breath sounds present. No wheezing or rales.  Musculoskeletal:     Cervical back: Normal range of motion and neck supple. No tenderness.  Lymphadenopathy:     Cervical: No cervical adenopathy.  Skin:    General: Skin is warm and dry.  Neurological:     Mental Status: He is alert.      No results found for any visits on 09/26/20.  Assessment & Plan     1. Encounter for support and coordination of transition of care Hospitalization notes, consults, labs, and imaging from 09/14/20-09/19/20 all reviewed personally by me prior to patient arrival. Telephone call performed on 09/20/20.  2. Chronic obstructive pulmonary disease with acute exacerbation ([place]) Worsening with concerning features of lung carcinoma with overlying CAP. Patient just started outpatient omnicef just this morning. Will stop Advair. Start Trelegy. Add albuterol for prn use as below. Keep appointments with Oncology and Pulmonology. Will order home oxygen. Pulse oximeter ordered from pharmacy to monitor O2 levels. Use 2L O2 for when oxygen is below 90% while awake during the day. Use continuously overnight 2L. Will await pulmonology consult and PET scan. Call if acute issue arises.  - Fluticasone-Umeclidin-Vilant (TRELEGY ELLIPTA) 100-62.5-25 MCG/INH AEPB; Inhale 1 puff into the lungs daily.  Dispense: 60 each; Refill: 5 - albuterol (VENTOLIN HFA) 108 (90 Base) MCG/ACT inhaler; Inhale 2 puffs into the lungs every 6 (six) hours as needed for wheezing or shortness of breath.  Dispense: 16 g; Refill: 5 - Misc. Devices (PULSE OXIMETER FOR FINGER) MISC; To check pulse oximeter PRN; If Oxygen is less than 90% he is to wear 2L O2 during day  while awake  Dispense: 1 each; Refill: 0  3. [name] Has PET scan next week and pulmonology referral first week of January. Discussed importance of stopping smoking. Patient reports he hasn't been smoking, group home assistant mentions he smoked one cigarette yesterday.  4. Mild protein-calorie malnutrition ([place]) Continue Ensure or Boost between meals.    No follow-ups on file.      Reynolds Bowl, PA-C, have reviewed all documentation for this visit. The documentation on 09/27/20 for the exam, diagnosis, procedures, and orders are all accurate and complete.   Rubye Beach  [facility] [phone] (phone) [phone] (fax)  [place]

## 2020-09-27 ENCOUNTER — Encounter: Payer: Self-pay | Admitting: Physician Assistant

## 2020-09-27 ENCOUNTER — Ambulatory Visit: Payer: Self-pay

## 2020-09-27 ENCOUNTER — Telehealth: Payer: Self-pay

## 2020-09-27 NOTE — Telephone Encounter (Signed)
[facility] from [facility] needs orders for oxygen for patient per discharge note from visit yesterday. Call placed to office. Per office they will contact Joseph Holt at number provided  Reason for Disposition . [1] Caller requesting NON-URGENT health information AND [2] PCP's office is the best resource  Answer Assessment - Initial Assessment Questions 1. REASON FOR CALL or QUESTION: "What is your reason for calling today?" or "How can I best help you?" or "What question do you have that I can help answer?"     Need O2 ordered for patient  Protocols used: INFORMATION ONLY CALL - NO TRIAGE-A-AH

## 2020-09-27 NOTE — Telephone Encounter (Signed)
Spoke with Rodman Pickle with [facility] and she said it is ok to fax the order even thou the office note is not ready

## 2020-09-27 NOTE — Telephone Encounter (Signed)
Office note and order faxed

## 2020-09-27 NOTE — Telephone Encounter (Signed)
Joseph Holt from [facility] calling requesting to talk to Joseph Holt about orders for oxygen for patient. Please call back [phone].

## 2020-09-29 ENCOUNTER — Emergency Department: Payer: Medicare Other

## 2020-09-29 ENCOUNTER — Encounter: Payer: Self-pay | Admitting: Emergency Medicine

## 2020-09-29 ENCOUNTER — Other Ambulatory Visit: Payer: Self-pay

## 2020-09-29 DIAGNOSIS — Z7951 Long term (current) use of inhaled steroids: Secondary | ICD-10-CM

## 2020-09-29 DIAGNOSIS — J690 Pneumonitis due to inhalation of food and vomit: Secondary | ICD-10-CM | POA: Diagnosis present

## 2020-09-29 DIAGNOSIS — J441 Chronic obstructive pulmonary disease with (acute) exacerbation: Secondary | ICD-10-CM | POA: Diagnosis present

## 2020-09-29 DIAGNOSIS — C3492 Malignant neoplasm of unspecified part of left bronchus or lung: Secondary | ICD-10-CM | POA: Diagnosis present

## 2020-09-29 DIAGNOSIS — Z20822 Contact with and (suspected) exposure to covid-19: Secondary | ICD-10-CM | POA: Diagnosis not present

## 2020-09-29 DIAGNOSIS — Z791 Long term (current) use of non-steroidal anti-inflammatories (NSAID): Secondary | ICD-10-CM

## 2020-09-29 DIAGNOSIS — R0902 Hypoxemia: Secondary | ICD-10-CM | POA: Diagnosis not present

## 2020-09-29 DIAGNOSIS — J9621 Acute and chronic respiratory failure with hypoxia: Secondary | ICD-10-CM | POA: Diagnosis present

## 2020-09-29 DIAGNOSIS — C7801 Secondary malignant neoplasm of right lung: Secondary | ICD-10-CM | POA: Diagnosis present

## 2020-09-29 DIAGNOSIS — Z79899 Other long term (current) drug therapy: Secondary | ICD-10-CM

## 2020-09-29 DIAGNOSIS — R911 Solitary pulmonary nodule: Secondary | ICD-10-CM | POA: Diagnosis not present

## 2020-09-29 DIAGNOSIS — R6520 Severe sepsis without septic shock: Secondary | ICD-10-CM | POA: Diagnosis present

## 2020-09-29 DIAGNOSIS — F17210 Nicotine dependence, cigarettes, uncomplicated: Secondary | ICD-10-CM | POA: Diagnosis present

## 2020-09-29 DIAGNOSIS — J189 Pneumonia, unspecified organism: Secondary | ICD-10-CM | POA: Diagnosis not present

## 2020-09-29 DIAGNOSIS — A419 Sepsis, unspecified organism: Principal | ICD-10-CM | POA: Diagnosis present

## 2020-09-29 DIAGNOSIS — F259 Schizoaffective disorder, unspecified: Secondary | ICD-10-CM | POA: Diagnosis present

## 2020-09-29 DIAGNOSIS — J440 Chronic obstructive pulmonary disease with acute lower respiratory infection: Secondary | ICD-10-CM | POA: Diagnosis not present

## 2020-09-29 DIAGNOSIS — R0602 Shortness of breath: Secondary | ICD-10-CM | POA: Diagnosis not present

## 2020-09-29 DIAGNOSIS — R1310 Dysphagia, unspecified: Secondary | ICD-10-CM | POA: Diagnosis present

## 2020-09-29 LAB — CBC WITH DIFFERENTIAL/PLATELET
Abs Immature Granulocytes: 0.1 10*3/uL — ABNORMAL HIGH (ref 0.00–0.07)
Basophils Absolute: 0 10*3/uL (ref 0.0–0.1)
Basophils Relative: 0 %
Eosinophils Absolute: 0.3 10*3/uL (ref 0.0–0.5)
Eosinophils Relative: 3 %
HCT: 39.9 % (ref 39.0–52.0)
Hemoglobin: 12.4 g/dL — ABNORMAL LOW (ref 13.0–17.0)
Immature Granulocytes: 1 %
Lymphocytes Relative: 11 %
Lymphs Abs: 1.1 10*3/uL (ref 0.7–4.0)
MCH: 28.6 pg (ref 26.0–34.0)
MCHC: 31.1 g/dL (ref 30.0–36.0)
MCV: 91.9 fL (ref 80.0–100.0)
Monocytes Absolute: 1.1 10*3/uL — ABNORMAL HIGH (ref 0.1–1.0)
Monocytes Relative: 11 %
Neutro Abs: 7.4 10*3/uL (ref 1.7–7.7)
Neutrophils Relative %: 74 %
Platelets: 348 10*3/uL (ref 150–400)
RBC: 4.34 MIL/uL (ref 4.22–5.81)
RDW: 15.2 % (ref 11.5–15.5)
WBC: 10 10*3/uL (ref 4.0–10.5)
nRBC: 0 % (ref 0.0–0.2)

## 2020-09-29 LAB — COMPREHENSIVE METABOLIC PANEL
ALT: 27 U/L (ref 0–44)
AST: 25 U/L (ref 15–41)
Albumin: 2.9 g/dL — ABNORMAL LOW (ref 3.5–5.0)
Alkaline Phosphatase: 72 U/L (ref 38–126)
Anion gap: 10 (ref 5–15)
BUN: 24 mg/dL — ABNORMAL HIGH (ref 8–23)
CO2: 26 mmol/L (ref 22–32)
Calcium: 8.9 mg/dL (ref 8.9–10.3)
Chloride: 98 mmol/L (ref 98–111)
Creatinine, Ser: 1.19 mg/dL (ref 0.61–1.24)
GFR, Estimated: >60 mL/min (ref >60)
Glucose, Bld: 165 mg/dL — ABNORMAL HIGH (ref 70–99)
Potassium: 4.8 mmol/L (ref 3.5–5.1)
Sodium: 134 mmol/L — ABNORMAL LOW (ref 135–145)
Total Bilirubin: 0.4 mg/dL (ref 0.3–1.2)
Total Protein: 7.3 g/dL (ref 6.5–8.1)

## 2020-09-29 LAB — LACTIC ACID, PLASMA: Lactic Acid, Venous: 2 mmol/L (ref 0.5–1.9)

## 2020-09-29 NOTE — ED Notes (Signed)
Joseph Holt, [facility] called RN and told this RN pt was to be discharged with home oxygen. No order was placed as per Ms. Bruce.

## 2020-09-29 NOTE — ED Triage Notes (Signed)
Pt arrived via [facility] from [place], per EMS group home is not able to supply and maintain patient on oxygen as there are no "trained" employees to administer oxygen.  Pt has lung CA and requires home oxygen.   Per EMS pt has hx of cognitive impairment, was informed today by a staff member that he has Stage III CA "somehwere" which caused the patient to be upset and have some shortness of breath.   Per EMS pt was discharged on 12/15, was prescribed omnicef and oxygen, but did not receive either.  Pt was dx with PNA.

## 2020-09-29 NOTE — ED Notes (Signed)
Date and time results received: 09/29/20 2243 (use smartphrase ".now" to insert current time)  Test: Lactic Acid Critical Value: 2.0  Name of Provider Notified: Dr. Charna Archer  Orders Received? Or Actions Taken?: provider notified

## 2020-09-29 NOTE — ED Notes (Signed)
Pt transported to XR.  

## 2020-09-30 ENCOUNTER — Encounter: Payer: Self-pay | Admitting: Internal Medicine

## 2020-09-30 ENCOUNTER — Inpatient Hospital Stay
Admission: EM | Admit: 2020-09-30 | Discharge: 2020-10-02 | DRG: 871 | Disposition: A | Discharge status: Home Health | Payer: Medicare Other | Attending: Internal Medicine | Admitting: Internal Medicine

## 2020-09-30 DIAGNOSIS — F259 Schizoaffective disorder, unspecified: Secondary | ICD-10-CM | POA: Diagnosis present

## 2020-09-30 DIAGNOSIS — Z791 Long term (current) use of non-steroidal anti-inflammatories (NSAID): Secondary | ICD-10-CM | POA: Diagnosis not present

## 2020-09-30 DIAGNOSIS — C7801 Secondary malignant neoplasm of right lung: Secondary | ICD-10-CM | POA: Diagnosis present

## 2020-09-30 DIAGNOSIS — J440 Chronic obstructive pulmonary disease with acute lower respiratory infection: Secondary | ICD-10-CM | POA: Diagnosis present

## 2020-09-30 DIAGNOSIS — J441 Chronic obstructive pulmonary disease with (acute) exacerbation: Secondary | ICD-10-CM | POA: Diagnosis present

## 2020-09-30 DIAGNOSIS — A419 Sepsis, unspecified organism: Secondary | ICD-10-CM

## 2020-09-30 DIAGNOSIS — Z7951 Long term (current) use of inhaled steroids: Secondary | ICD-10-CM | POA: Diagnosis not present

## 2020-09-30 DIAGNOSIS — F17210 Nicotine dependence, cigarettes, uncomplicated: Secondary | ICD-10-CM | POA: Diagnosis present

## 2020-09-30 DIAGNOSIS — R0602 Shortness of breath: Secondary | ICD-10-CM | POA: Diagnosis not present

## 2020-09-30 DIAGNOSIS — C7800 Secondary malignant neoplasm of unspecified lung: Secondary | ICD-10-CM

## 2020-09-30 DIAGNOSIS — Z79899 Other long term (current) drug therapy: Secondary | ICD-10-CM | POA: Diagnosis not present

## 2020-09-30 DIAGNOSIS — J9622 Acute and chronic respiratory failure with hypercapnia: Secondary | ICD-10-CM | POA: Insufficient documentation

## 2020-09-30 DIAGNOSIS — J690 Pneumonitis due to inhalation of food and vomit: Secondary | ICD-10-CM

## 2020-09-30 DIAGNOSIS — J189 Pneumonia, unspecified organism: Secondary | ICD-10-CM

## 2020-09-30 DIAGNOSIS — J9621 Acute and chronic respiratory failure with hypoxia: Secondary | ICD-10-CM | POA: Diagnosis present

## 2020-09-30 DIAGNOSIS — C3492 Malignant neoplasm of unspecified part of left bronchus or lung: Secondary | ICD-10-CM

## 2020-09-30 DIAGNOSIS — R1310 Dysphagia, unspecified: Secondary | ICD-10-CM | POA: Diagnosis present

## 2020-09-30 DIAGNOSIS — C7802 Secondary malignant neoplasm of left lung: Secondary | ICD-10-CM | POA: Diagnosis not present

## 2020-09-30 DIAGNOSIS — Z20822 Contact with and (suspected) exposure to covid-19: Secondary | ICD-10-CM | POA: Diagnosis present

## 2020-09-30 DIAGNOSIS — R6520 Severe sepsis without septic shock: Secondary | ICD-10-CM | POA: Diagnosis present

## 2020-09-30 LAB — URINALYSIS, COMPLETE (UACMP) WITH MICROSCOPIC
Bilirubin Urine: NEGATIVE
Glucose, UA: NEGATIVE mg/dL
Hgb urine dipstick: NEGATIVE
Ketones, ur: 5 mg/dL — AB
Leukocytes,Ua: NEGATIVE
Nitrite: NEGATIVE
Protein, ur: 30 mg/dL — AB
Specific Gravity, Urine: 1.03 (ref 1.005–1.030)
pH: 5 (ref 5.0–8.0)

## 2020-09-30 LAB — PROTIME-INR
INR: 1 (ref 0.8–1.2)
Prothrombin Time: 12.8 seconds (ref 11.4–15.2)

## 2020-09-30 LAB — RESP PANEL BY RT-PCR (FLU A&B, COVID) ARPGX2
Influenza A by PCR: NEGATIVE
Influenza B by PCR: NEGATIVE
SARS Coronavirus 2 by RT PCR: NEGATIVE

## 2020-09-30 LAB — CORTISOL-AM, BLOOD: Cortisol - AM: 13.3 ug/dL (ref 6.7–22.6)

## 2020-09-30 LAB — LACTIC ACID, PLASMA: Lactic Acid, Venous: 2 mmol/L (ref 0.5–1.9)

## 2020-09-30 LAB — PROCALCITONIN: Procalcitonin: 0.12 ng/mL

## 2020-09-30 MED ORDER — SODIUM CHLORIDE 0.9 % IV SOLN
2.0000 g | INTRAVENOUS | Status: DC
  Administered 2020-09-30 – 2020-10-01 (×2): 2 g via INTRAVENOUS
  Filled 2020-09-30 (×3): qty 20

## 2020-09-30 MED ORDER — SODIUM CHLORIDE 0.9 % IV SOLN
1.0000 g | Freq: Once | INTRAVENOUS | Status: AC
  Administered 2020-09-30: 01:00:00 1 g via INTRAVENOUS
  Filled 2020-09-30: qty 10

## 2020-09-30 MED ORDER — AMANTADINE HCL 100 MG PO CAPS
100.0000 mg | ORAL_CAPSULE | Freq: Two times a day (BID) | ORAL | Status: DC
  Administered 2020-09-30 – 2020-10-02 (×5): 100 mg via ORAL
  Filled 2020-09-30 (×7): qty 1

## 2020-09-30 MED ORDER — ONDANSETRON HCL 4 MG PO TABS: 4.0000 mg | ORAL_TABLET | Freq: Four times a day (QID) | ORAL | Status: DC | PRN

## 2020-09-30 MED ORDER — ONDANSETRON HCL 4 MG/2ML IJ SOLN: 4.0000 mg | Freq: Four times a day (QID) | INTRAMUSCULAR | Status: DC | PRN

## 2020-09-30 MED ORDER — MIRTAZAPINE 15 MG PO TABS
30.0000 mg | ORAL_TABLET | Freq: Every day | ORAL | Status: DC
  Administered 2020-09-30 – 2020-10-01 (×2): 30 mg via ORAL
  Filled 2020-09-30 (×2): qty 2

## 2020-09-30 MED ORDER — LORAZEPAM 0.5 MG PO TABS
0.5000 mg | ORAL_TABLET | Freq: Every day | ORAL | Status: DC
  Administered 2020-09-30 – 2020-10-01 (×2): 0.5 mg via ORAL
  Filled 2020-09-30 (×3): qty 1

## 2020-09-30 MED ORDER — MEMANTINE HCL ER 28 MG PO CP24
28.0000 mg | ORAL_CAPSULE | Freq: Every day | ORAL | Status: DC
  Administered 2020-09-30 – 2020-10-02 (×3): 28 mg via ORAL
  Filled 2020-09-30 (×3): qty 1

## 2020-09-30 MED ORDER — GABAPENTIN 400 MG PO CAPS
400.0000 mg | ORAL_CAPSULE | Freq: Every morning | ORAL | Status: DC
  Administered 2020-09-30 – 2020-10-02 (×3): 400 mg via ORAL
  Filled 2020-09-30: qty 4
  Filled 2020-09-30 (×2): qty 1

## 2020-09-30 MED ORDER — SODIUM CHLORIDE 0.9 % IV SOLN
500.0000 mg | INTRAVENOUS | Status: DC
  Administered 2020-09-30 – 2020-10-02 (×2): 500 mg via INTRAVENOUS
  Filled 2020-09-30 (×4): qty 500

## 2020-09-30 MED ORDER — LACTATED RINGERS IV SOLN: INTRAVENOUS | Status: DC

## 2020-09-30 MED ORDER — DIVALPROEX SODIUM 500 MG PO DR TAB
500.0000 mg | DELAYED_RELEASE_TABLET | Freq: Two times a day (BID) | ORAL | Status: DC
  Administered 2020-09-30 – 2020-10-02 (×5): 500 mg via ORAL
  Filled 2020-09-30 (×6): qty 1

## 2020-09-30 MED ORDER — SODIUM CHLORIDE 0.9 % IV SOLN
500.0000 mg | Freq: Once | INTRAVENOUS | Status: AC
  Administered 2020-09-30: 02:00:00 500 mg via INTRAVENOUS
  Filled 2020-09-30: qty 500

## 2020-09-30 MED ORDER — ACETAMINOPHEN 325 MG PO TABS
650.0000 mg | ORAL_TABLET | Freq: Four times a day (QID) | ORAL | Status: DC | PRN
  Administered 2020-10-01: 650 mg via ORAL
  Filled 2020-09-30 (×2): qty 2

## 2020-09-30 MED ORDER — MEMANTINE HCL-DONEPEZIL HCL ER 28-10 MG PO CP24: 1.0000 | ORAL_CAPSULE | Freq: Every day | ORAL | Status: DC

## 2020-09-30 MED ORDER — ICOSAPENT ETHYL 1 G PO CAPS
2.0000 g | ORAL_CAPSULE | Freq: Two times a day (BID) | ORAL | Status: DC
  Administered 2020-09-30 – 2020-10-01 (×3): 2 g via ORAL
  Filled 2020-09-30 (×7): qty 2

## 2020-09-30 MED ORDER — SIMVASTATIN 20 MG PO TABS
20.0000 mg | ORAL_TABLET | Freq: Every day | ORAL | Status: DC
  Administered 2020-09-30 – 2020-10-01 (×2): 20 mg via ORAL
  Filled 2020-09-30: qty 1
  Filled 2020-09-30: qty 2

## 2020-09-30 MED ORDER — DONEPEZIL HCL 5 MG PO TABS
10.0000 mg | ORAL_TABLET | Freq: Every day | ORAL | Status: DC
  Administered 2020-09-30 – 2020-10-01 (×2): 10 mg via ORAL
  Filled 2020-09-30 (×3): qty 2

## 2020-09-30 MED ORDER — ENOXAPARIN SODIUM 40 MG/0.4ML ~~LOC~~ SOLN
40.0000 mg | SUBCUTANEOUS | Status: DC
  Administered 2020-09-30 – 2020-10-02 (×3): 40 mg via SUBCUTANEOUS
  Filled 2020-09-30 (×3): qty 0.4

## 2020-09-30 MED ORDER — ACETAMINOPHEN 650 MG RE SUPP: 650.0000 mg | Freq: Four times a day (QID) | RECTAL | Status: DC | PRN

## 2020-09-30 MED ORDER — CHLORPROMAZINE HCL 25 MG PO TABS
25.0000 mg | ORAL_TABLET | Freq: Three times a day (TID) | ORAL | Status: DC
  Administered 2020-09-30 – 2020-10-02 (×7): 25 mg via ORAL
  Filled 2020-09-30 (×12): qty 1

## 2020-09-30 MED ORDER — IPRATROPIUM-ALBUTEROL 0.5-2.5 (3) MG/3ML IN SOLN
3.0000 mL | RESPIRATORY_TRACT | Status: DC | PRN
  Administered 2020-10-01 – 2020-10-02 (×3): 3 mL via RESPIRATORY_TRACT
  Filled 2020-09-30 (×3): qty 3

## 2020-09-30 NOTE — ED Notes (Signed)
Pt requesting update from doctor. Notified attending Zhang.

## 2020-09-30 NOTE — H&P (Signed)
History and Physical    [name]. MWN:[id number] DOB: [birth year] DOA: 09/30/2020  PCP: Mar Daring, PA-C   Patient coming from: Group home  I have personally briefly reviewed patient's old medical records in [place]  Chief Complaint: Shortness of breath  HPI: Joseph Holt. is a 69 y.o. male with medical history significant for bipolar mood disorder, COPD, nicotine dependence, and recent hospitalization, discharged on 12/15 with diagnosis of CAP, acute respiratory failure with finding of lung metastases, discharged on oxygen at 2 L/min with plans for outpatient oncology work-up who was brought in by EMS with shortness of breath.  Patient has not been using oxygen while at the group home as they are not equipped to administer it.  He was discharged on Omnicef, and is uncertain whether he got it.  Patient saw his PCP on 12/22 with shortness of breath and treated for COPD exacerbation and home O2 was reordered, however shortness of breath has worsened and he was brought in for evaluation.  History is limited due to patient's schizophrenia.   ED Course: On arrival, he was afebrile, hypotensive at 98/71, tachycardic at 111 with respirations of 24 req blood work: uiring 4 L O2 via nasal cannula to maintain sats in the mid 90s.  Normal WBC but with lactic acid of 2 >>2.  Labs otherwise mostly unremarkable. Imaging: Chest x-ray showed pulmonary metastases with suggestion of overlying infection within the right upper lung zone, interval right middle lobe collapse and possible trace right pleural effusion.  Patient started on IV fluid bolus, Rocephin and azithromycin.  Hospitalist consulted for admission.  Review of Systems: As per HPI otherwise all other systems on review of systems negative.    Past Medical History:  Diagnosis Date  . Bipolar 1 disorder ([place])   . Schizo affective schizophrenia [facility])     Past Surgical History:  Procedure Laterality Date  .  APPENDECTOMY    . HERNIA REPAIR       reports that he has been smoking cigarettes. He has been smoking about 0.20 packs per day. He has never used smokeless tobacco. He reports that he does not drink alcohol and does not use drugs.  No Known Allergies  History reviewed. No pertinent family history.    Prior to Admission medications   Medication Sig Start Date End Date Taking? Authorizing Provider  albuterol (VENTOLIN HFA) 108 (90 Base) MCG/ACT inhaler Inhale 2 puffs into the lungs every 6 (six) hours as needed for wheezing or shortness of breath. 09/26/20   Mar Daring, PA-C  amantadine (SYMMETREL) 100 MG capsule TAKE 1 CAPSULE BY MOUTH TWICE DAILY. **DO NOT CRUSH** Patient taking differently: Take 100 mg by mouth 2 (two) times daily. 08/14/20   Mar Daring, PA-C  Ascorbic Acid (VITAMIN C) 1000 MG tablet TAKE ONE TABLET BY MOUTH EVERY DAY Patient taking differently: Take 1,000 mg by mouth daily. 04/27/20   Mar Daring, PA-C  cefdinir (OMNICEF) 300 MG capsule Take 1 capsule (300 mg total) by mouth every 12 (twelve) hours. Patient not taking: Reported on 09/20/2020 09/19/20   Fritzi Mandes, MD  cetirizine (ZYRTEC) 10 MG tablet TAKE (1) TABLET BY MOUTH ONCE DAILY. Patient taking differently: Take 10 mg by mouth daily. 08/13/20   Mar Daring, PA-C  chlorproMAZINE (THORAZINE) 25 MG tablet TAKE 1 TABLET BY MOUTH 3 TIMES DAILY. Patient taking differently: Take 25 mg by mouth 3 (three) times daily. 08/14/20   Mar Daring, PA-C  cyanocobalamin 1000 MCG tablet Take 1 tablet (1,000 mcg total) by mouth daily. 03/04/19   Mar Daring, PA-C  divalproex (DEPAKOTE) 500 MG DR tablet TAKE 1 TABLET BY MOUTH TWICE DAILY. **DO NOT CRUSH** Patient taking differently: Take 500 mg by mouth 2 (two) times daily. 09/15/19   Mar Daring, PA-C  feeding supplement (ENSURE ENLIVE / ENSURE PLUS) LIQD Take 237 mLs by mouth 3 (three) times daily between meals. Patient  not taking: Reported on 09/20/2020 09/19/20   Fritzi Mandes, MD  Fluticasone-Umeclidin-Vilant (TRELEGY ELLIPTA) 100-62.5-25 MCG/INH AEPB Inhale 1 puff into the lungs daily. 09/26/20   Mar Daring, PA-C  gabapentin (NEURONTIN) 400 MG capsule TAKE 1 TABLET BY MOUTH EVERY MORNING. Patient taking differently: Take 400 mg by mouth daily. 09/15/19   Mar Daring, PA-C  LORazepam (ATIVAN) 0.5 MG tablet TAKE 1 TABLET BY MOUTH AT BEDTIME. Patient taking differently: Take 0.5 mg by mouth at bedtime. 07/18/20   Mar Daring, PA-C  meloxicam (MOBIC) 15 MG tablet TAKE (1) TABLET BY MOUTH ONCE DAILY. Patient taking differently: Take 15 mg by mouth daily. 08/13/20   Mar Daring, PA-C  mirtazapine (REMERON) 30 MG tablet TAKE 1 TABLET BY MOUTH AT BEDTIME. Patient taking differently: Take 30 mg by mouth at bedtime. 08/13/20   Mar Daring, PA-C  Misc. Devices (PULSE OXIMETER FOR FINGER) MISC To check pulse oximeter PRN; If Oxygen is less than 90% he is to wear 2L O2 during day while awake 09/26/20   Burnette, Jennifer M, PA-C  NAMZARIC 28-10 MG CP24 TAKE ONE CAPSULE ONCE A DAY. * DO NOT CRUSH * Patient taking differently: Take 1 capsule by mouth daily. 04/27/20   Mar Daring, PA-C  omeprazole (PRILOSEC) 20 MG capsule TAKE ONE CAPSULE BY MOUTH EVERY MORNING *DO NOT CRUSH* Patient taking differently: Take 20 mg by mouth daily. 04/27/20   Mar Daring, PA-C  QUEtiapine (SEROQUEL XR) 300 MG 24 hr tablet TAKE 2 TABLETS BY MOUTH AT BEDTIME. Patient taking differently: Take 600 mg by mouth at bedtime. 08/14/20   Mar Daring, PA-C  simvastatin (ZOCOR) 20 MG tablet TAKE 1 TABLET BY MOUTH AT BEDTIME. Patient taking differently: Take 20 mg by mouth at bedtime. 08/13/20   Mar Daring, PA-C  tamsulosin (FLOMAX) 0.4 MG CAPS capsule TAKE (1) CAPSULE BY MOUTH AT BEDTIME.*DO NOT CRUSH* Patient taking differently: Take 0.4 mg by mouth at bedtime. 01/02/20    Burnette, Clearnce Sorrel, PA-C  VASCEPA 1 g capsule TAKE 2 CAPSULES BY MOUTH TWO TIMES A DAY Patient taking differently: Take 2 g by mouth 2 (two) times daily. 04/27/20   Mar Daring, PA-C  amantadine (SYMMETREL) 100 MG capsule TAKE ONE CAPSULE TWICE A DAY; (DO NOT CRUSH) 04/27/20   Mar Daring, PA-C  chlorproMAZINE (THORAZINE) 25 MG tablet TAKE 1 TABLET BY MOUTH 3 TIMES DAILY. 08/17/19   Mar Daring, PA-C  QUEtiapine (SEROQUEL XR) 300 MG 24 hr tablet Take 2 tablets (600 mg total) by mouth at bedtime. 07/25/19   Mar Daring, PA-C    Physical Exam: Vitals:   09/29/20 2207 09/30/20 0059 09/30/20 0100 09/30/20 0200  BP:  98/71 101/71 101/72  Pulse:  (!) 111 (!) 110 (!) 108  Resp:  (!) 21 (!) 24 19  Temp:      TempSrc:      SpO2:  95% 99% 97%  Weight: 73 kg     Height: 6' (1.829 m)  Vitals:   09/29/20 2207 09/30/20 0059 09/30/20 0100 09/30/20 0200  BP:  98/71 101/71 101/72  Pulse:  (!) 111 (!) 110 (!) 108  Resp:  (!) 21 (!) 24 19  Temp:      TempSrc:      SpO2:  95% 99% 97%  Weight: 73 kg     Height: 6' (1.829 m)         Constitutional: Alert and oriented x 3 .  Conversational dyspnea HEENT:      Head: Normocephalic and atraumatic.         Eyes: PERLA, EOMI, Conjunctivae are normal. Sclera is non-icteric.       Mouth/Throat: Mucous membranes are moist.       Neck: Supple with no signs of meningismus. Cardiovascular: Regular rate and rhythm. No murmurs, gallops, or rubs. 2+ symmetrical distal pulses are present . No JVD. No LE edema Respiratory: Respiratory effort increased with diminished breath sounds bilaterally coarse breath sounds.Gastrointestinal: Soft, non tender, and non distended with positive bowel sounds.  Genitourinary: No CVA tenderness. Musculoskeletal: Nontender with normal range of motion in all extremities. No cyanosis, or erythema of extremities. Neurologic:  Face is symmetric. Moving all extremities. No gross focal  neurologic deficits . Skin: Skin is warm, dry.  No rash or ulcers Psychiatric: Mood and affect are normal    Labs on Admission: I have personally reviewed following labs and imaging studies  CBC: Recent Labs  Lab 09/29/20 2221  WBC 10.0  NEUTROABS 7.4  HGB 12.4*  HCT 39.9  MCV 91.9  PLT 941   Basic Metabolic Panel: Recent Labs  Lab 09/29/20 2221  NA 134*  K 4.8  CL 98  CO2 26  GLUCOSE 165*  BUN 24*  CREATININE 1.19  CALCIUM 8.9   GFR: Estimated Creatinine Clearance: 60.5 mL/min (by C-G formula based on SCr of 1.19 mg/dL). Liver Function Tests: Recent Labs  Lab 09/29/20 2221  AST 25  ALT 27  ALKPHOS 72  BILITOT 0.4  PROT 7.3  ALBUMIN 2.9*   No results for input(s): LIPASE, AMYLASE in the last 168 hours. No results for input(s): AMMONIA in the last 168 hours. Coagulation Profile: No results for input(s): INR, PROTIME in the last 168 hours. Cardiac Enzymes: No results for input(s): CKTOTAL, CKMB, CKMBINDEX, TROPONINI in the last 168 hours. BNP (last 3 results) No results for input(s): PROBNP in the last 8760 hours. HbA1C: No results for input(s): HGBA1C in the last 72 hours. CBG: No results for input(s): GLUCAP in the last 168 hours. Lipid Profile: No results for input(s): CHOL, HDL, LDLCALC, TRIG, CHOLHDL, LDLDIRECT in the last 72 hours. Thyroid Function Tests: No results for input(s): TSH, T4TOTAL, FREET4, T3FREE, THYROIDAB in the last 72 hours. Anemia Panel: No results for input(s): VITAMINB12, FOLATE, FERRITIN, TIBC, IRON, RETICCTPCT in the last 72 hours. Urine analysis:    Component Value Date/Time   COLORURINE YELLOW (A) 01/06/2018 1335   APPEARANCEUR CLEAR (A) 01/06/2018 1335   APPEARANCEUR Clear 09/09/2014 1421   LABSPEC 1.014 01/06/2018 1335   LABSPEC 1.027 09/09/2014 1421   PHURINE 7.0 01/06/2018 1335   GLUCOSEU NEGATIVE 01/06/2018 1335   GLUCOSEU Negative 09/09/2014 1421   HGBUR NEGATIVE 01/06/2018 1335   BILIRUBINUR NEGATIVE  01/06/2018 1335   BILIRUBINUR Negative 09/09/2014 1421   KETONESUR 5 (A) 01/06/2018 1335   PROTEINUR NEGATIVE 01/06/2018 1335   NITRITE NEGATIVE 01/06/2018 1335   LEUKOCYTESUR NEGATIVE 01/06/2018 1335   LEUKOCYTESUR Negative 09/09/2014 1421    Radiological Exams  on Admission: DG Chest 2 View  Result Date: 09/29/2020 CLINICAL DATA:  Shortness of breath. EXAM: CHEST - 2 VIEW COMPARISON:  Chest x-ray 09/17/2020, CT abdomen pelvis 09/18/2020. FINDINGS: Left to right mediastinal shift. Otherwise the heart size and mediastinal contours are within normal limits. Interval right middle lobe collapse. Interval increase in right upper lobe airspace opacity with underlying pulmonary nodules consistent with metastases. Persistent right lower lobe opacity overlying multiple other pulmonary nodule metastases. Redemonstration of left mid lung zone and left lower lung zone pulmonary nodules. No pulmonary edema. Possible trace right pleural effusion is noted. No definite left pleural effusion. No pneumothorax. No acute osseous abnormality. IMPRESSION: 1. Pulmonary metastases with suggestion of overlying infection/inflammation within the right upper lung zone. 2. Interval right middle lobe collapse. 3. Possible trace right pleural effusion. Electronically Signed   By: Iven Finn M.D.   On: 09/29/2020 22:48     Assessment/Plan 69 year old male group home resident with history of bipolar mood disorder, COPD, nicotine dependence, and recent hospitalization, discharged on 12/15 with diagnosis of CAP, with finding of lung metastases, discharged on oxygen at 2 L/min with plans for outpatient oncology work-up presenting with shortness of breath.  Patient has not been using oxygen while at the group home as they are not equipped to administer it.      Acute on chronic respiratory failure with hypoxia (HCC)   Suspect sepsis (HCC)   Right upper lobe pneumonia, postobstructive -Recently treated for CAP with large  pleural effusion and finding of underlying lung cancer -Patient presents with shortness of breath with right upper lobe pneumonia on chest x-ray -Covid and flu negative -Patient requiring 4 L O2 to maintain sats in the mid 90s.  Was not compliant with home O2 prescribed at discharge due to limitations at group home -Sepsis criteria includes tachycardia, tachypnea and elevated lactic acid.  Patient has been on Omnicef -Lactic acid may on the other hand be due to respiratory distress -Rocephin and azithromycin -IV fluids    Metastatic primary lung cancer,     Pulmonary metastasis (HCC) -Recent diagnosis during hospitalization 2 weeks prior -Was seen by Dr. Rogue Bussing and Dr. Glenis Smoker off during his stay -Consider oncology and pulmonology consult in the a.m.    Schizo affective schizophrenia ([place]) -Continue home meds  COPD -DuoNebs as needed    DVT prophylaxis: Lovenox  Code Status: full code  Family Communication:  none  Disposition Plan: Back to previous home environment Consults called: none  Status:At the time of admission, it appears that the appropriate admission status for this patient is INPATIENT. This is judged to be reasonable and necessary in order to provide the required intensity of service to ensure the patient's safety given the presenting symptoms, physical exam findings, and initial radiographic and laboratory data in the context of their  Comorbid conditions.   Patient requires inpatient status due to high intensity of service, high risk for further deterioration and high frequency of surveillance required.   I certify that at the point of admission it is my clinical judgment that the patient will require inpatient hospital care spanning beyond [place] MD [facility]     09/30/2020, 2:16 AM

## 2020-09-30 NOTE — Progress Notes (Signed)
[facility] following Sepsis Protocol

## 2020-09-30 NOTE — ED Notes (Signed)
Pt assisted with urinal. Urinated 200cc. Assisted with repositioning in bed.

## 2020-09-30 NOTE — ED Notes (Signed)
Dinner tray on bedside table.

## 2020-09-30 NOTE — Progress Notes (Signed)
PROGRESS NOTE    Joseph Curb.  OEU:[id number] DOB: April 13, 1951 DOA: 09/30/2020 PCP: Mar Daring, PA-C   Chief complaint: shortness of breath Brief Narrative:  Joseph Curb. is a 69 y.o. male with medical history significant for bipolar mood disorder, COPD, nicotine dependence, and recent hospitalization, discharged on 12/15 with diagnosis of CAP, acute respiratory failure with finding of lung metastases, discharged on oxygen at 2 L/min with plans for outpatient oncology work-up who was brought in by EMS with shortness of breath. Chest x-ray showed metastatic lung cancer, with the possibility of inflammation.  He is started on antibiotics for hospital care associate pneumonia.   Assessment & Plan:   Principal Problem:   Acute on chronic respiratory failure with hypoxia (HCC) Active Problems:   Schizo affective schizophrenia ([place])   Right upper lobe pneumonia   Metastatic primary lung cancer, left ([place])   Sepsis ([place])   Pulmonary metastasis ([place])   Severe sepsis ([place])  #1.  Acute on chronic hypoxemic respiratory failure. Sepsis, not severe sepsis. Right upper lobe pneumonia, possibility of aspiration pneumonia. Patient does not have significant elevation of procalcitonin level.  Patient also states that he has dysphagia and choking at home.  He has a significant crackles after eating.  I suspect he has silent aspiration.  We obtain speech therapy evaluation with modified barium swallow. Continue current antibiotics for now. Patient currently living in a group home, will obtain [facility] consult for discharge options. He does not have volume overload.  BNP normal.  #2.  Metastatic lung cancer. Patient recently diagnosed.  Will need to follow-up with oncology as outpatient.  3.  Schizoaffective disorder. Continue home medicines per  4.  COPD. Stable.   DVT prophylaxis: Lovenox Code Status: Full Family Communication:  Disposition Plan:  .   Status is:  Inpatient  Remains inpatient appropriate because:Inpatient level of care appropriate due to severity of illness   Dispo: The patient is from: Group home              Anticipated d/c is to: Group home              Anticipated d/c date is: 1 day              Patient currently is not medically stable to d/c.        I/O last 3 completed shifts: In: -  Out: 650 [Urine:650] No intake/output data recorded.     Consultants:   None  Procedures: None  Antimicrobials:  Rocephin and Zithromax.  Subjective: Patient is eating, he denies any short of breath.  He has significant crackles in the base, he has no cough.  He states that occasionally he chokes.  He has some dysphagia. He has no abdominal pain or nausea vomiting. No fever or chills. No dysuria hematuria. Objective: Vitals:   09/30/20 1245 09/30/20 1300 09/30/20 1315 09/30/20 1330  BP:  112/74  105/84  Pulse:  96  92  Resp: (!) 21  20 19   Temp:      TempSrc:      SpO2: 100%  96% 100%  Weight:      Height:        Intake/Output Summary (Last 24 hours) at 09/30/2020 1346 Last data filed at 09/30/2020 0454 Gross per 24 hour  Intake --  Output 650 ml  Net -650 ml   Filed Weights   09/29/20 2207  Weight: 73 kg    Examination:  General exam: Appears calm  and comfortable  Respiratory system: Diffuse crackles in both lungs. Respiratory effort normal. Cardiovascular system: S1 & S2 heard, RRR. No JVD, murmurs, rubs, gallops or clicks. No pedal edema. Gastrointestinal system: Abdomen is nondistended, soft and nontender. No organomegaly or masses felt. Normal bowel sounds heard. Central nervous system: Alert and oriented x2. No focal neurological deficits. Extremities: Symmetric  Skin: No rashes, lesions or ulcers Psychiatry:  Mood & affect appropriate.     Data Reviewed: I have personally reviewed following labs and imaging studies  CBC: Recent Labs  Lab 09/29/20 2221  WBC 10.0  NEUTROABS 7.4  HGB  12.4*  HCT 39.9  MCV 91.9  PLT 341   Basic Metabolic Panel: Recent Labs  Lab 09/29/20 2221  NA 134*  K 4.8  CL 98  CO2 26  GLUCOSE 165*  BUN 24*  CREATININE 1.19  CALCIUM 8.9   GFR: Estimated Creatinine Clearance: 60.5 mL/min (by C-G formula based on SCr of 1.19 mg/dL). Liver Function Tests: Recent Labs  Lab 09/29/20 2221  AST 25  ALT 27  ALKPHOS 72  BILITOT 0.4  PROT 7.3  ALBUMIN 2.9*   No results for input(s): LIPASE, AMYLASE in the last 168 hours. No results for input(s): AMMONIA in the last 168 hours. Coagulation Profile: Recent Labs  Lab 09/30/20 0440  INR 1.0   Cardiac Enzymes: No results for input(s): CKTOTAL, CKMB, CKMBINDEX, TROPONINI in the last 168 hours. BNP (last 3 results) No results for input(s): PROBNP in the last 8760 hours. HbA1C: No results for input(s): HGBA1C in the last 72 hours. CBG: No results for input(s): GLUCAP in the last 168 hours. Lipid Profile: No results for input(s): CHOL, HDL, LDLCALC, TRIG, CHOLHDL, LDLDIRECT in the last 72 hours. Thyroid Function Tests: No results for input(s): TSH, T4TOTAL, FREET4, T3FREE, THYROIDAB in the last 72 hours. Anemia Panel: No results for input(s): VITAMINB12, FOLATE, FERRITIN, TIBC, IRON, RETICCTPCT in the last 72 hours. Sepsis Labs: Recent Labs  Lab 09/29/20 2221 09/30/20 0054 09/30/20 0440  PROCALCITON  --   --  0.12  LATICACIDVEN 2.0* 2.0*  --     Recent Results (from the past 240 hour(s))  Blood culture (routine x 2)     Status: None (Preliminary result)   Collection Time: 09/30/20  1:27 AM   Specimen: BLOOD  Result Value Ref Range Status   Specimen Description BLOOD RIGHT ANTECUBITAL  Final   Special Requests   Final    BOTTLES DRAWN AEROBIC AND ANAEROBIC Blood Culture results may not be optimal due to an excessive volume of blood received in culture bottles   Culture   Final    NO GROWTH < 12 HOURS Performed at [facility], [address] [place], [place]  [address]    Report Status PENDING  Incomplete  Blood culture (routine x 2)     Status: None (Preliminary result)   Collection Time: 09/30/20  1:27 AM   Specimen: BLOOD  Result Value Ref Range Status   Specimen Description BLOOD  Final   Special Requests NONE  Final   Culture   Final    NO GROWTH < 12 HOURS Performed at [facility], [address] [place], [place] [address]    Report Status PENDING  Incomplete  Resp Panel by RT-PCR (Flu A&B, Covid) Nasopharyngeal Swab     Status: None   Collection Time: 09/30/20  1:27 AM   Specimen: Nasopharyngeal Swab; Nasopharyngeal(NP) swabs in vial transport medium  Result Value Ref Range Status  SARS Coronavirus 2 by RT PCR NEGATIVE NEGATIVE Final    Comment: (NOTE) SARS-CoV-2 target nucleic acids are NOT DETECTED.  The SARS-CoV-2 RNA is generally detectable in upper respiratory specimens during the acute phase of infection. The lowest concentration of SARS-CoV-2 viral copies this assay can detect is 138 copies/mL. A negative result does not preclude SARS-Cov-2 infection and should not be used as the sole basis for treatment or other patient management decisions. A negative result may occur with  improper specimen collection/handling, submission of specimen other than nasopharyngeal swab, presence of viral mutation(s) within the areas targeted by this assay, and inadequate number of viral copies(<138 copies/mL). A negative result must be combined with clinical observations, patient history, and epidemiological information. The expected result is Negative.  Fact Sheet for Patients:  EntrepreneurPulse.com.au  Fact Sheet for Healthcare Providers:  IncredibleEmployment.be  This test is no t yet approved or cleared by the [place] FDA and  has been authorized for detection and/or diagnosis of SARS-CoV-2 by FDA under an Emergency Use Authorization (EUA). This EUA will remain  in effect  (meaning this test can be used) for the duration of the COVID-19 declaration under Section 564(b)(1) of the Act, 21 U.S.C.section 360bbb-3(b)(1), unless the authorization is terminated  or revoked sooner.       Influenza A by PCR NEGATIVE NEGATIVE Final   Influenza B by PCR NEGATIVE NEGATIVE Final    Comment: (NOTE) The Xpert Xpress SARS-CoV-2/FLU/RSV plus assay is intended as an aid in the diagnosis of influenza from Nasopharyngeal swab specimens and should not be used as a sole basis for treatment. Nasal washings and aspirates are unacceptable for Xpert Xpress SARS-CoV-2/FLU/RSV testing.  Fact Sheet for Patients: EntrepreneurPulse.com.au  Fact Sheet for Healthcare Providers: IncredibleEmployment.be  This test is not yet approved or cleared by the [place] FDA and has been authorized for detection and/or diagnosis of SARS-CoV-2 by FDA under an Emergency Use Authorization (EUA). This EUA will remain in effect (meaning this test can be used) for the duration of the COVID-19 declaration under Section 564(b)(1) of the Act, 21 U.S.C. section 360bbb-3(b)(1), unless the authorization is terminated or revoked.  Performed at [facility], [address], [place], [place] [address]          Radiology Studies: DG Chest 2 View  Result Date: 09/29/2020 CLINICAL DATA:  Shortness of breath. EXAM: CHEST - 2 VIEW COMPARISON:  Chest x-ray 09/17/2020, CT abdomen pelvis 09/18/2020. FINDINGS: Left to right mediastinal shift. Otherwise the heart size and mediastinal contours are within normal limits. Interval right middle lobe collapse. Interval increase in right upper lobe airspace opacity with underlying pulmonary nodules consistent with metastases. Persistent right lower lobe opacity overlying multiple other pulmonary nodule metastases. Redemonstration of left mid lung zone and left lower lung zone pulmonary nodules. No pulmonary edema.  Possible trace right pleural effusion is noted. No definite left pleural effusion. No pneumothorax. No acute osseous abnormality. IMPRESSION: 1. Pulmonary metastases with suggestion of overlying infection/inflammation within the right upper lung zone. 2. Interval right middle lobe collapse. 3. Possible trace right pleural effusion. Electronically Signed   By: Iven Finn M.D.   On: 09/29/2020 22:48        Scheduled Meds: . amantadine  100 mg Oral BID  . chlorproMAZINE  25 mg Oral TID  . divalproex  500 mg Oral BID  . donepezil  10 mg Oral QHS  . enoxaparin (LOVENOX) injection  40 mg Subcutaneous Q24H  . gabapentin  400 mg  Oral q morning - 10a  . icosapent Ethyl  2 g Oral BID  . LORazepam  0.5 mg Oral QHS  . memantine  28 mg Oral Daily  . mirtazapine  30 mg Oral QHS  . simvastatin  20 mg Oral q1800   Continuous Infusions: . azithromycin    . cefTRIAXone (ROCEPHIN)  IV       LOS: 0 days    Time spent: 28 minutes    Sharen Hones, MD [facility]   To contact the attending provider between 7A-7P or the covering provider during after hours 7P-7A, please log into the web site [email or web address] and access using universal [place] password for that web site. If you do not have the password, please call the hospital operator.  09/30/2020, 1:46 PM

## 2020-09-30 NOTE — Progress Notes (Signed)
CODE SEPSIS - PHARMACY COMMUNICATION  **Broad Spectrum Antibiotics should be administered within 1 hour of Sepsis diagnosis**  Time Code Sepsis Called/Page Received: 0215  Antibiotics Ordered: Zithromax and Rocephin  Time of 1st antibiotic administration: 0128 (already given)  Additional action taken by pharmacy:   If necessary, Name of Provider/Nurse Contacted:     Ena Dawley ,PharmD Clinical Pharmacist  09/30/2020  2:19 AM

## 2020-09-30 NOTE — ED Provider Notes (Signed)
[facility] Emergency Department Provider Note   ____________________________________________    I have reviewed the triage vital signs and the nursing notes.   HISTORY  Chief Complaint Shortness of Breath   History is somewhat limited  HPI Joseph [place]. is a 69 y.o. male with history of bipolar disorder, schizoaffective schizophrenia, COPD brought in for shortness of breath. History is limited as noted, patient apparently comes from group home was supposed to be on home oxygen but they are unable to do so at group home. Recent diagnosis of lung CA as well per review of records. Was discharged on the 15th of this month, noted large left upper lobe mass suspected cancer, was treated for multifocal pneumonia, was supposed to be on antibiotics as an outpatient however it is unclear whether that happened. Apparently saw PCP who attempted to write home oxygen for patient however group home unable to administer.  Past Medical History:  Diagnosis Date  . Bipolar 1 disorder ([place])   . Schizo affective schizophrenia [facility])     Patient Active Problem List   Diagnosis Date Noted  . S/P thoracentesis   . Palliative care by specialist   . Goals of care, counseling/discussion   . [name] 09/16/2020  . Metastatic primary lung cancer, left ([place])   . Benign prostatic hyperplasia without lower urinary tract symptoms   . COPD with acute exacerbation ([place])   . Acute respiratory failure with hypoxia ([place])   . Hyperlipidemia   . Multifocal pneumonia 09/14/2020  . Shortness of breath 09/14/2020  . Generalized weakness 09/14/2020  . GERD (gastroesophageal reflux disease) 09/14/2020  . Mild protein-calorie malnutrition ([place]) 12/06/2018  . Mixed simple and mucopurulent chronic bronchitis ([place]) 12/06/2018  . Sleep disturbance 09/06/2018  . Bipolar 1 disorder ([place]) 04/14/2018  . Schizo affective schizophrenia ([place]) 04/14/2018  . Cognitive  impairment 01/06/2018  . Tobacco use disorder 01/02/2018  . Paranoid schizophrenia ([place]) 02/21/2015  . Social maladjustment 02/21/2015    Past Surgical History:  Procedure Laterality Date  . APPENDECTOMY    . HERNIA REPAIR      Prior to Admission medications   Medication Sig Start Date End Date Taking? Authorizing Provider  albuterol (VENTOLIN HFA) 108 (90 Base) MCG/ACT inhaler Inhale 2 puffs into the lungs every 6 (six) hours as needed for wheezing or shortness of breath. 09/26/20   Mar Daring, PA-C  amantadine (SYMMETREL) 100 MG capsule TAKE 1 CAPSULE BY MOUTH TWICE DAILY. **DO NOT CRUSH** Patient taking differently: Take 100 mg by mouth 2 (two) times daily. 08/14/20   Mar Daring, PA-C  Ascorbic Acid (VITAMIN C) 1000 MG tablet TAKE ONE TABLET BY MOUTH EVERY DAY Patient taking differently: Take 1,000 mg by mouth daily. 04/27/20   Mar Daring, PA-C  cefdinir (OMNICEF) 300 MG capsule Take 1 capsule (300 mg total) by mouth every 12 (twelve) hours. Patient not taking: Reported on 09/20/2020 09/19/20   Fritzi Mandes, MD  cetirizine (ZYRTEC) 10 MG tablet TAKE (1) TABLET BY MOUTH ONCE DAILY. Patient taking differently: Take 10 mg by mouth daily. 08/13/20   Mar Daring, PA-C  chlorproMAZINE (THORAZINE) 25 MG tablet TAKE 1 TABLET BY MOUTH 3 TIMES DAILY. Patient taking differently: Take 25 mg by mouth 3 (three) times daily. 08/14/20   Mar Daring, PA-C  cyanocobalamin 1000 MCG tablet Take 1 tablet (1,000 mcg total) by mouth daily. 03/04/19   Mar Daring, PA-C  divalproex (DEPAKOTE)  500 MG DR tablet TAKE 1 TABLET BY MOUTH TWICE DAILY. **DO NOT CRUSH** Patient taking differently: Take 500 mg by mouth 2 (two) times daily. 09/15/19   Mar Daring, PA-C  feeding supplement (ENSURE ENLIVE / ENSURE PLUS) LIQD Take 237 mLs by mouth 3 (three) times daily between meals. Patient not taking: Reported on 09/20/2020 09/19/20   Fritzi Mandes, MD   Fluticasone-Umeclidin-Vilant (TRELEGY ELLIPTA) 100-62.5-25 MCG/INH AEPB Inhale 1 puff into the lungs daily. 09/26/20   Mar Daring, PA-C  gabapentin (NEURONTIN) 400 MG capsule TAKE 1 TABLET BY MOUTH EVERY MORNING. Patient taking differently: Take 400 mg by mouth daily. 09/15/19   Mar Daring, PA-C  LORazepam (ATIVAN) 0.5 MG tablet TAKE 1 TABLET BY MOUTH AT BEDTIME. Patient taking differently: Take 0.5 mg by mouth at bedtime. 07/18/20   Mar Daring, PA-C  meloxicam (MOBIC) 15 MG tablet TAKE (1) TABLET BY MOUTH ONCE DAILY. Patient taking differently: Take 15 mg by mouth daily. 08/13/20   Mar Daring, PA-C  mirtazapine (REMERON) 30 MG tablet TAKE 1 TABLET BY MOUTH AT BEDTIME. Patient taking differently: Take 30 mg by mouth at bedtime. 08/13/20   Mar Daring, PA-C  Misc. Devices (PULSE OXIMETER FOR FINGER) MISC To check pulse oximeter PRN; If Oxygen is less than 90% he is to wear 2L O2 during day while awake 09/26/20   Burnette, Jennifer M, PA-C  NAMZARIC 28-10 MG CP24 TAKE ONE CAPSULE ONCE A DAY. * DO NOT CRUSH * Patient taking differently: Take 1 capsule by mouth daily. 04/27/20   Mar Daring, PA-C  omeprazole (PRILOSEC) 20 MG capsule TAKE ONE CAPSULE BY MOUTH EVERY MORNING *DO NOT CRUSH* Patient taking differently: Take 20 mg by mouth daily. 04/27/20   Mar Daring, PA-C  QUEtiapine (SEROQUEL XR) 300 MG 24 hr tablet TAKE 2 TABLETS BY MOUTH AT BEDTIME. Patient taking differently: Take 600 mg by mouth at bedtime. 08/14/20   Mar Daring, PA-C  simvastatin (ZOCOR) 20 MG tablet TAKE 1 TABLET BY MOUTH AT BEDTIME. Patient taking differently: Take 20 mg by mouth at bedtime. 08/13/20   Mar Daring, PA-C  tamsulosin (FLOMAX) 0.4 MG CAPS capsule TAKE (1) CAPSULE BY MOUTH AT BEDTIME.*DO NOT CRUSH* Patient taking differently: Take 0.4 mg by mouth at bedtime. 01/02/20   Burnette, Clearnce Sorrel, PA-C  VASCEPA 1 g capsule TAKE 2 CAPSULES BY  MOUTH TWO TIMES A DAY Patient taking differently: Take 2 g by mouth 2 (two) times daily. 04/27/20   Mar Daring, PA-C  amantadine (SYMMETREL) 100 MG capsule TAKE ONE CAPSULE TWICE A DAY; (DO NOT CRUSH) 04/27/20   Mar Daring, PA-C  chlorproMAZINE (THORAZINE) 25 MG tablet TAKE 1 TABLET BY MOUTH 3 TIMES DAILY. 08/17/19   Mar Daring, PA-C  QUEtiapine (SEROQUEL XR) 300 MG 24 hr tablet Take 2 tablets (600 mg total) by mouth at bedtime. 07/25/19   Mar Daring, PA-C     Allergies Patient has no known allergies.  No family history on file.  Social History Social History   Tobacco Use  . Smoking status: Current Every Day Smoker    Packs/day: 0.20    Types: Cigarettes  . Smokeless tobacco: Never Used  . Tobacco comment: pt smokes 1 cigarette a day  Substance Use Topics  . Alcohol use: No  . Drug use: No    Review of Systems limited as noted above     ____________________________________________   PHYSICAL EXAM:  VITAL SIGNS: ED  Triage Vitals  Enc Vitals Group     BP 09/29/20 2206 97/60     Pulse Rate 09/29/20 2206 (!) 111     Resp 09/29/20 2206 (!) 22     Temp 09/29/20 2206 98.2 F (36.8 C)     Temp Source 09/29/20 2206 Oral     SpO2 09/29/20 2206 96 %     Weight 09/29/20 2207 73 kg (161 lb)     Height 09/29/20 2207 1.829 m (6')     Head Circumference --      Peak Flow --      Pain Score 09/29/20 2205 0     Pain Loc --      Pain Edu? --      Excl. in [place]? --     Constitutional: Alert  Eyes: Conjunctivae are normal.  Head: Atraumatic. Nose: No congestion/rhinnorhea. Mouth/Throat: Mucous membranes are moist.   Neck:  Painless ROM Cardiovascular: Normal rate, regular rhythm. Grossly normal heart sounds.  Good peripheral circulation. Respiratory: Increased respiratory effort with mild tachypnea.  No retractions. Lungs CTAB. Gastrointestinal: Soft and nontender. No distention.  No CVA tenderness.  Musculoskeletal: No lower extremity  tenderness nor edema.  Warm and well perfused Neurologic:  Normal speech and language. No gross focal neurologic deficits are appreciated.  Skin:  Skin is warm, dry and intact. No rash noted. Psychiatric: Mood and affect are normal. Speech and behavior are normal.  ____________________________________________   LABS (all labs ordered are listed, but only abnormal results are displayed)  Labs Reviewed  LACTIC ACID, PLASMA - Abnormal; Notable for the following components:      Result Value   Lactic Acid, Venous 2.0 (*)    All other components within normal limits  LACTIC ACID, PLASMA - Abnormal; Notable for the following components:   Lactic Acid, Venous 2.0 (*)    All other components within normal limits  COMPREHENSIVE METABOLIC PANEL - Abnormal; Notable for the following components:   Sodium 134 (*)    Glucose, Bld 165 (*)    BUN 24 (*)    Albumin 2.9 (*)    All other components within normal limits  CBC WITH DIFFERENTIAL/PLATELET - Abnormal; Notable for the following components:   Hemoglobin 12.4 (*)    Monocytes Absolute 1.1 (*)    Abs Immature Granulocytes 0.10 (*)    All other components within normal limits  CULTURE, BLOOD (ROUTINE X 2)  CULTURE, BLOOD (ROUTINE X 2)  RESP PANEL BY RT-PCR (FLU A&B, COVID) ARPGX2  URINALYSIS, COMPLETE (UACMP) WITH MICROSCOPIC   ____________________________________________  EKG  None ____________________________________________  RADIOLOGY  Chest x-ray viewed by me, concerning for pneumonia ____________________________________________   PROCEDURES  Procedure(s) performed: No  .1-3 Lead EKG Interpretation Performed by: Lavonia Drafts, MD Authorized by: Lavonia Drafts, MD     Interpretation: normal     ECG rate assessment: normal     Rhythm: sinus rhythm     Ectopy: none     Conduction: normal       Critical Care performed: No ____________________________________________   INITIAL IMPRESSION / ASSESSMENT AND PLAN /  ED COURSE  Pertinent labs & imaging results that were available during my care of the patient were reviewed by me and considered in my medical decision making (see chart for details).  Patient presents with hypoxia. Recently discharged with newfound lung mass suspicious for cancer and after treatment for multifocal pneumonia, was post to follow-up with antibiotics but there is a question whether he was receiving them.  Found to be hypoxic here and requiring 4 L nasal cannula keep saturations above 90%.  Chest x-ray appears to demonstrate continued pneumonia  Lactic acid is elevated, will start Rocephin and azithromycin and admit to the hospitalist service    ____________________________________________   FINAL CLINICAL IMPRESSION(S) / ED DIAGNOSES  Final diagnoses:  Community acquired pneumonia, unspecified laterality  Shortness of breath        Note:  This document was prepared using Dragon voice recognition software and may include unintentional dictation errors.   Lavonia Drafts, MD 09/30/20 [phone]

## 2020-09-30 NOTE — ED Notes (Signed)
Pt assisted with urinal

## 2020-09-30 NOTE — ED Notes (Signed)
Provider at bedside with pt

## 2020-09-30 NOTE — ED Notes (Signed)
Pt requested update again on plan for tomorrow. Updated. Denies any other needs.

## 2020-09-30 NOTE — ED Notes (Signed)
Contacted pharm as when pulling simvastatin from pyxis due orders popped up for azithromycin and rocephin scheduled for 1800 and 1830. However, neither shows up as scheduled at these times on the [facility]. Pharm states they will assess and reschedule if needed.

## 2020-09-30 NOTE — ED Notes (Signed)
Care assumed of pt at this time. Pt is resting in bed with no c/o. LR infusing at 160ml/hr. Made aware that he is admitted and awaiting a room upstairs. Informed that they would work on his oxygen for discharge during this admission. Verbalized understanding. Repositioned in bed with no assist at this time.

## 2020-09-30 NOTE — ED Notes (Signed)
Patient denies pain and is resting comfortably.  

## 2020-09-30 NOTE — ED Notes (Signed)
Lunch tray at bedside. Placed on table for pt and assisted with repositioning to eat.

## 2020-10-01 ENCOUNTER — Inpatient Hospital Stay: Payer: Medicare Other

## 2020-10-01 ENCOUNTER — Other Ambulatory Visit: Payer: Self-pay

## 2020-10-01 ENCOUNTER — Encounter: Payer: Self-pay | Admitting: Internal Medicine

## 2020-10-01 DIAGNOSIS — A419 Sepsis, unspecified organism: Principal | ICD-10-CM

## 2020-10-01 LAB — CBC WITH DIFFERENTIAL/PLATELET
Abs Immature Granulocytes: 0.08 10*3/uL — ABNORMAL HIGH (ref 0.00–0.07)
Basophils Absolute: 0 10*3/uL (ref 0.0–0.1)
Basophils Relative: 0 %
Eosinophils Absolute: 0.3 10*3/uL (ref 0.0–0.5)
Eosinophils Relative: 3 %
HCT: 36.5 % — ABNORMAL LOW (ref 39.0–52.0)
Hemoglobin: 11.1 g/dL — ABNORMAL LOW (ref 13.0–17.0)
Immature Granulocytes: 1 %
Lymphocytes Relative: 11 %
Lymphs Abs: 1 10*3/uL (ref 0.7–4.0)
MCH: 28.9 pg (ref 26.0–34.0)
MCHC: 30.4 g/dL (ref 30.0–36.0)
MCV: 95.1 fL (ref 80.0–100.0)
Monocytes Absolute: 0.7 10*3/uL (ref 0.1–1.0)
Monocytes Relative: 8 %
Neutro Abs: 6.8 10*3/uL (ref 1.7–7.7)
Neutrophils Relative %: 77 %
Platelets: 296 10*3/uL (ref 150–400)
RBC: 3.84 MIL/uL — ABNORMAL LOW (ref 4.22–5.81)
RDW: 15.3 % (ref 11.5–15.5)
WBC: 9 10*3/uL (ref 4.0–10.5)
nRBC: 0 % (ref 0.0–0.2)

## 2020-10-01 LAB — BASIC METABOLIC PANEL
Anion gap: 9 (ref 5–15)
BUN: 15 mg/dL (ref 8–23)
CO2: 28 mmol/L (ref 22–32)
Calcium: 8.9 mg/dL (ref 8.9–10.3)
Chloride: 100 mmol/L (ref 98–111)
Creatinine, Ser: 0.86 mg/dL (ref 0.61–1.24)
GFR, Estimated: >60 mL/min (ref >60)
Glucose, Bld: 94 mg/dL (ref 70–99)
Potassium: 4.4 mmol/L (ref 3.5–5.1)
Sodium: 137 mmol/L (ref 135–145)

## 2020-10-01 LAB — MAGNESIUM: Magnesium: 1.8 mg/dL (ref 1.7–2.4)

## 2020-10-01 MED ORDER — ENSURE ENLIVE PO LIQD
237.0000 mL | Freq: Two times a day (BID) | ORAL | Status: DC
  Administered 2020-10-01: 13:00:00 237 mL via ORAL

## 2020-10-01 NOTE — ED Notes (Signed)
This staff noticed that pt has not eaten meal tray. Patient states that he doesn't normally eat, he drinks ensure. Provider messaged regarding to see if can add to his diet.

## 2020-10-01 NOTE — ED Notes (Signed)
RN attempted IV access without success. This totals 4 IV attempts without success while admitted.

## 2020-10-01 NOTE — ED Notes (Signed)
Patient transported to X-ray 

## 2020-10-01 NOTE — TOC Progression Note (Signed)
Transition of Care (TOC) - Progression Note    Patient Details  Name: Joseph Holt. MRN: [id number] Date of Birth: [birth year]  Transition of Care [facility]) CM/SW [place], [place] Phone Number: [phone] 10/01/2020, 1:44 PM  Clinical Narrative:     Patient presents to [facility] due to shortness of breath.  Patient is a resident at [facility] care [place] [phone].  CSW was informed the patient was supposed to receive O2 when he d/c on 12/15.  CSW looked over the patient's chart and was unable find orders for O2 or any mention that O2 was needed for the patient upon d/c.  CSW updated Attending with this information and informed him the patient would need a DMW order for O2.  Attending requested CSW contact group home to find out if they would be able to assist patient with O2.  CSW left voicemail for Chrystie Nose group home manager.   Expected Discharge Plan: Rest Home Barriers to Discharge: Continued Medical Work up  Expected Discharge Plan and Services Expected Discharge Plan: Rest Home In-house Referral: Clinical Social Work   Post Acute Care Choice: [place] (O2) Living arrangements for the past 2 months: [place] ([facility] [place] [phone])                                       Social Determinants of Health (SDOH) Interventions    Readmission Risk Interventions No flowsheet data found.

## 2020-10-01 NOTE — TOC Progression Note (Signed)
Transition of Care (TOC) - Progression Note    Patient Details  Name: Joseph Holt. MRN: [id number] Date of Birth: [birth year]  [facility] CM/SW [place], [place] Phone Number: [phone] 10/01/2020, 4:31 PM  Clinical Narrative:     Patient will need O2 for home and home health RN.  CSW contacted [place] for RN and [employer] for O2 needs.  Paitent pending O2 assessment.  CSW received confirmation from group home manager Chrystie Nose [phone] he is able to return with O2. CSW stated she would contact Ms. Bruce once the patient was ready for d/c.   Expected Discharge Plan: Rest Home Barriers to Discharge: Continued Medical Work up  Expected Discharge Plan and Services Expected Discharge Plan: Rest Home In-house Referral: Clinical Social Work   Post Acute Care Choice: [place] (O2) Living arrangements for the past 2 months: [place] ([facility] [place] [phone])                                       Social Determinants of Health (SDOH) Interventions    Readmission Risk Interventions No flowsheet data found.

## 2020-10-01 NOTE — ED Notes (Signed)
Care assumed of pt at this time. Pt uses urine independently; though gets very winded after standing up. Pt noted to have wet non productive cough. Audible crackles noted. Pt is confused, knows self, place not time. Pt does request to go home, but does know that he can't until they figure out the oxygen situation.

## 2020-10-01 NOTE — ED Notes (Signed)
[facility] brought pt's home O2 at this time. Placed in room with pt.

## 2020-10-01 NOTE — Progress Notes (Addendum)
{SLP ALL NOTES:161 Objective Swallowing Evaluation: Type of Study: MBS-Modified Barium Swallow Study   Patient Details  Name: Joseph Holt. MRN: [id number] Date of Birth: 1951-03-08  Today's Date: 10/01/2020 Time: SLP Start Time (ACUTE ONLY): 0945 -SLP Stop Time (ACUTE ONLY): 1045  SLP Time Calculation (min) (ACUTE ONLY): 60 min   Past Medical History:  Past Medical History:  Diagnosis Date  . Bipolar 1 disorder ([place])   . Schizo affective schizophrenia [facility])    Past Surgical History:  Past Surgical History:  Procedure Laterality Date  . APPENDECTOMY    . HERNIA REPAIR     HPI: Pt is a 69 y.o. male with medical history significant for bipolar mood disorder, COPD, nicotine dependence, GERD and recent hospitalization, discharged on 12/15 with diagnosis of CAP; acute respiratory failure with finding of lung Ca  metastises and Dietician note indicating signfiicant weight loss in recent months. Pt was discharged on oxygen at 2 L/min with plans for outpatient Oncology work-up but was brought in by EMS with shortness of breath.  Patient has Not been using oxygen while at the [facility] as they are Not equipped to administer it.  He was discharged on Omnicef, and is uncertain whether he got it.  Patient saw his PCP on 12/22 with shortness of breath and treated for COPD exacerbation and home O2 was reordered again, however shortness of breath worsened and he was brought in for evaluation.  History is limited due to patient's mental status.   Subjective: pt was alert and pleasant. report he was cold - blanket given    Assessment / Plan / Recommendation  CHL IP CLINICAL IMPRESSIONS 10/01/2020  Clinical Impression Pt appears to present w/ oropharyngeal phase dysphagia impacted significantly by his Kyphotic positioning (horizontal plane of pharynx-UES), decreased pharygneal pressure/BOT strength during swallowing, impact of Edentulous status for effective mastication of solids, and an  appearance of Achalasia during the Esophgeal phase. All of the issues above including his declined Pulmonary status Baseline increase risk for aspiration during and after the swallow -- including risk for Retrograde backflow from the Esophagus to the pharynx. Pt exhibited inconsistent trace-mild laryngeal Penetration during the swallow -- this was noted w/ larger sips of Thin liquids. W/ more controlled smaller sips of thin liquids, No immediate Penetration occurred; similar presentation noted w/ the other consistencies assessed, and No immediate Aspiration was noted. No Aspiration appeared to occur during this study.  Due to pt's significant medical and Pulmonary issues at Baseline (lung Ca metastasis) as well as generalized weakness impacting Stamina for physical tasks, and pt's presenting Dysphagia, pt is at increased risk for Aspiration of oral intake thus further Pulmonary decline -- he also appears challenged in meeting his full nutritional/hydration needs orally. Recommend modifying diet to a Dysphagia level 3 w/ thin liquids via Cup w/ Strict aspiration precautions including Pills given in Puree, and support/Supervision w/ feeding and following aspiration precautions at meals. A consult to Palliative Care for overall GOC is strongly recommended. Pt initially stated at the end of this study that he did not want to "change his diet"; he kept stating he wanted to "leave the hospital today" and could this SLP speak to the MD about this. Recommend ST services f/u w/ pt's status, monitoring of pt's Pulmonary status for negative sequelae of aspiration and need to downgrade pt's diet to Nectar consistency liquids, availability of Dysphagia diet, ordering of/providing thickened liquids if necessary, education on aspiration precautoins -- fully support downgrading diet to Nectar consistency  liquids if part of pt's overall GOC per Palliative Care discussion w/ pt. MD updated on above; modifications to diet and pills  given placed in chart.  SLP Visit Diagnosis Dysphagia, oropharyngeal phase (R13.12)  Attention and concentration deficit following --  Frontal lobe and executive function deficit following --  Impact on safety and function Mild aspiration risk;Moderate aspiration risk;Risk for inadequate nutrition/hydration      CHL IP TREATMENT RECOMMENDATION 10/01/2020  Treatment Recommendations Therapy as outlined in treatment plan below     Prognosis 10/01/2020  Prognosis for Safe Diet Advancement Fair  Barriers to Reach Goals Time post onset;Severity of deficits;Behavior  Barriers/Prognosis Comment baseline deficits, positioning    CHL IP DIET RECOMMENDATION 10/01/2020  SLP Diet Recommendations Dysphagia 3 (Mech soft) solids;Thin liquid  Liquid Administration via Straw  Medication Administration Whole meds with puree  Compensations Minimize environmental distractions;Slow rate;Small sips/bites;Lingual sweep for clearance of pocketing;Multiple dry swallows after each bite/sip;Follow solids with liquid  Postural Changes Remain semi-upright after after feeds/meals (Comment);Seated upright at 90 degrees      CHL IP OTHER RECOMMENDATIONS 10/01/2020  Recommended Consults (No Data)  Oral Care Recommendations Oral care BID;Oral care before and after PO;Staff/trained caregiver to provide oral care  Other Recommendations (No Data)      CHL IP FOLLOW UP RECOMMENDATIONS 10/01/2020  Follow up Recommendations Skilled Nursing facility      [facility] IP FREQUENCY AND DURATION 10/01/2020  Speech Therapy Frequency (ACUTE ONLY) min 3x week  Treatment Duration 2 weeks           CHL IP ORAL PHASE 10/01/2020  Oral Phase Impaired  Oral - Pudding Teaspoon --  Oral - Pudding Cup --  Oral - Honey Teaspoon --  Oral - Honey Cup NT  Oral - Nectar Teaspoon --  Oral - Nectar Cup 2  Oral - Nectar Straw --  Oral - Thin Teaspoon --  Oral - Thin Cup 5  Oral - Thin Straw --  Oral - Puree 3  Oral - Mech Soft 2  Oral  - Regular --  Oral - Multi-Consistency --  Oral - Pill --  Oral Phase - Comment --    CHL IP PHARYNGEAL PHASE 10/01/2020  Pharyngeal Phase Impaired  Pharyngeal- Pudding Teaspoon --  Pharyngeal --  Pharyngeal- Pudding Cup --  Pharyngeal --  Pharyngeal- Honey Teaspoon --  Pharyngeal --  Pharyngeal- Honey Cup NT  Pharyngeal --  Pharyngeal- Nectar Teaspoon --  Pharyngeal --  Pharyngeal- Nectar Cup -2  Pharyngeal --  Pharyngeal- Nectar Straw --  Pharyngeal --  Pharyngeal- Thin Teaspoon --  Pharyngeal --  Pharyngeal- Thin Cup 5  Pharyngeal --  Pharyngeal- Thin Straw --  Pharyngeal --  Pharyngeal- Puree 3  Pharyngeal --  Pharyngeal- Mechanical Soft 2  Pharyngeal --  Pharyngeal- Regular --  Pharyngeal --  Pharyngeal- Multi-consistency --  Pharyngeal --  Pharyngeal- Pill --  Pharyngeal --  Pharyngeal Comment --     CHL IP CERVICAL ESOPHAGEAL PHASE 10/01/2020  Cervical Esophageal Phase Impaired  Pudding Teaspoon --  Pudding Cup --  Honey Teaspoon --  Honey Cup NT  Nectar Teaspoon --  Nectar Cup 2  Nectar Straw --  Thin Teaspoon --  Thin Cup 5  Thin Straw --  Puree 3  Mechanical Soft 2  Regular --  Multi-consistency --  Pill --  Cervical Esophageal Comment --        Orinda Kenner, MS, CCC-SLP Speech Language Pathologist Rehab Services [phone] [facility] 10/01/2020, 5:31 PM

## 2020-10-01 NOTE — Progress Notes (Signed)
PROGRESS NOTE    Joseph Holt.  FMB:[id number] DOB: [birth year] DOA: 09/30/2020 PCP: Mar Daring, PA-C  Complaint.  Shortness of breath  Brief Narrative:  Joseph Holtis a 69 y.o.malewith medical history significant forbipolar mood disorder, COPD, nicotine dependence, and recent hospitalization, discharged on 12/15 with diagnosis of CAP, acute respiratory failure with finding of lung metastases, discharged on oxygen at 2 L/min with plans for outpatient oncology work-up who was brought in by EMS with shortness of breath. Chest x-ray showed metastatic lung cancer, with the possibility of inflammation.  He is started on antibiotics for hospital care associate pneumonia.   Assessment & Plan:   Principal Problem:   Acute on chronic respiratory failure with hypoxia (HCC) Active Problems:   Schizo affective schizophrenia ([place])   Right upper lobe pneumonia   Metastatic primary lung cancer, left ([place])   Sepsis ([place])   Pulmonary metastasis ([place])  #1.  Acute on chronic hypoxemic respiratory failure. Sepsis. Right upper lobe pneumonia secondary to aspiration pneumonia. Patient has been seen by speech therapy, patient has risk of aspiration from pharyngeal phase and esophageal phase.  Patient appeared not to be able to follow instructions. I will continue antibiotics for today. Will be discharged home with oxygen tomorrow. Confirm with group home, patient can use oxygen there.  2.  Metastatic lung cancer. Follow-up with oncology as outpatient.  Also obtained palliative care consult.  3.  Schizoaffective disorder. Continue home medicines.    DVT prophylaxis: Lovenox Code Status: Full Family Communication:  Disposition Plan:  .   Status is: Inpatient  Remains inpatient appropriate because:Inpatient level of care appropriate due to severity of illness   Dispo: The patient is from: Home              Anticipated d/c is to: Home              Anticipated  d/c date is: 1 day              Patient currently is not medically stable to d/c.        I/O last 3 completed shifts: In: 241.7 [IV Piggyback:241.7] Out: 7017 [Urine:1525] Total I/O In: -  Out: 300 [Urine:300]     Consultants:   palliative care  Procedures: None  Antimicrobials:   Subjective: On 2 L oxygen, short of breath with exertion.  Cough, nonproductive. No fever or chills. No abdominal pain nausea vomiting.   Objective: Vitals:   10/01/20 0800 10/01/20 0830 10/01/20 1145 10/01/20 1300  BP: 109/76 120/79 121/83 128/79  Pulse: 89 92 76 97  Resp: (!) 29 20 20 20   Temp:      TempSrc:      SpO2: 98% 93% 94% 96%  Weight:      Height:        Intake/Output Summary (Last 24 hours) at 10/01/2020 1614 Last data filed at 10/01/2020 1137 Gross per 24 hour  Intake 241.68 ml  Output 675 ml  Net -433.32 ml   Filed Weights   09/29/20 2207  Weight: 73 kg    Examination:  General exam: Appears calm and comfortable  Respiratory system: Rhonchi bilaterally. Respiratory effort normal. Cardiovascular system: S1 & S2 heard, RRR. No JVD, murmurs, rubs, gallops or clicks. No pedal edema. Gastrointestinal system: Abdomen is nondistended, soft and nontender. No organomegaly or masses felt. Normal bowel sounds heard. Central nervous system: Alert and oriented. No focal neurological deficits. Extremities: Symmetric  Skin: No rashes, lesions or ulcers Psychiatry:  Mood & affect appropriate.     Data Reviewed: I have personally reviewed following labs and imaging studies  CBC: Recent Labs  Lab 09/29/20 2221 10/01/20 0545  WBC 10.0 9.0  NEUTROABS 7.4 6.8  HGB 12.4* 11.1*  HCT 39.9 36.5*  MCV 91.9 95.1  PLT 348 782   Basic Metabolic Panel: Recent Labs  Lab 09/29/20 2221 10/01/20 0545  NA 134* 137  K 4.8 4.4  CL 98 100  CO2 26 28  GLUCOSE 165* 94  BUN 24* 15  CREATININE 1.19 0.86  CALCIUM 8.9 8.9  MG  --  1.8   GFR: Estimated Creatinine  Clearance: 83.7 mL/min (by C-G formula based on SCr of 0.86 mg/dL). Liver Function Tests: Recent Labs  Lab 09/29/20 2221  AST 25  ALT 27  ALKPHOS 72  BILITOT 0.4  PROT 7.3  ALBUMIN 2.9*   No results for input(s): LIPASE, AMYLASE in the last 168 hours. No results for input(s): AMMONIA in the last 168 hours. Coagulation Profile: Recent Labs  Lab 09/30/20 0440  INR 1.0   Cardiac Enzymes: No results for input(s): CKTOTAL, CKMB, CKMBINDEX, TROPONINI in the last 168 hours. BNP (last 3 results) No results for input(s): PROBNP in the last 8760 hours. HbA1C: No results for input(s): HGBA1C in the last 72 hours. CBG: No results for input(s): GLUCAP in the last 168 hours. Lipid Profile: No results for input(s): CHOL, HDL, LDLCALC, TRIG, CHOLHDL, LDLDIRECT in the last 72 hours. Thyroid Function Tests: No results for input(s): TSH, T4TOTAL, FREET4, T3FREE, THYROIDAB in the last 72 hours. Anemia Panel: No results for input(s): VITAMINB12, FOLATE, FERRITIN, TIBC, IRON, RETICCTPCT in the last 72 hours. Sepsis Labs: Recent Labs  Lab 09/29/20 2221 09/30/20 0054 09/30/20 0440  PROCALCITON  --   --  0.12  LATICACIDVEN 2.0* 2.0*  --     Recent Results (from the past 240 hour(s))  Blood culture (routine x 2)     Status: None (Preliminary result)   Collection Time: 09/30/20  1:27 AM   Specimen: BLOOD  Result Value Ref Range Status   Specimen Description BLOOD RIGHT ANTECUBITAL  Final   Special Requests   Final    BOTTLES DRAWN AEROBIC AND ANAEROBIC Blood Culture results may not be optimal due to an excessive volume of blood received in culture bottles   Culture   Final    NO GROWTH 1 DAY Performed at [facility], [address] [place], [place] [address]    Report Status PENDING  Incomplete  Blood culture (routine x 2)     Status: None (Preliminary result)   Collection Time: 09/30/20  1:27 AM   Specimen: BLOOD  Result Value Ref Range Status   Specimen Description  BLOOD  Final   Special Requests NONE  Final   Culture   Final    NO GROWTH 1 DAY Performed at [facility], [address], [place], [place] [address]    Report Status PENDING  Incomplete  Resp Panel by RT-PCR (Flu A&B, Covid) Nasopharyngeal Swab     Status: None   Collection Time: 09/30/20  1:27 AM   Specimen: Nasopharyngeal Swab; Nasopharyngeal(NP) swabs in vial transport medium  Result Value Ref Range Status   SARS Coronavirus 2 by RT PCR NEGATIVE NEGATIVE Final    Comment: (NOTE) SARS-CoV-2 target nucleic acids are NOT DETECTED.  The SARS-CoV-2 RNA is generally detectable in upper respiratory specimens during the acute phase of infection. The lowest concentration of SARS-CoV-2 viral copies  this assay can detect is 138 copies/mL. A negative result does not preclude SARS-Cov-2 infection and should not be used as the sole basis for treatment or other patient management decisions. A negative result may occur with  improper specimen collection/handling, submission of specimen other than nasopharyngeal swab, presence of viral mutation(s) within the areas targeted by this assay, and inadequate number of viral copies(<138 copies/mL). A negative result must be combined with clinical observations, patient history, and epidemiological information. The expected result is Negative.  Fact Sheet for Patients:  EntrepreneurPulse.com.au  Fact Sheet for Healthcare Providers:  IncredibleEmployment.be  This test is no t yet approved or cleared by the [place] FDA and  has been authorized for detection and/or diagnosis of SARS-CoV-2 by FDA under an Emergency Use Authorization (EUA). This EUA will remain  in effect (meaning this test can be used) for the duration of the COVID-19 declaration under Section 564(b)(1) of the Act, 21 U.S.C.section 360bbb-3(b)(1), unless the authorization is terminated  or revoked sooner.       Influenza A by PCR  NEGATIVE NEGATIVE Final   Influenza B by PCR NEGATIVE NEGATIVE Final    Comment: (NOTE) The Xpert Xpress SARS-CoV-2/FLU/RSV plus assay is intended as an aid in the diagnosis of influenza from Nasopharyngeal swab specimens and should not be used as a sole basis for treatment. Nasal washings and aspirates are unacceptable for Xpert Xpress SARS-CoV-2/FLU/RSV testing.  Fact Sheet for Patients: EntrepreneurPulse.com.au  Fact Sheet for Healthcare Providers: IncredibleEmployment.be  This test is not yet approved or cleared by the [place] FDA and has been authorized for detection and/or diagnosis of SARS-CoV-2 by FDA under an Emergency Use Authorization (EUA). This EUA will remain in effect (meaning this test can be used) for the duration of the COVID-19 declaration under Section 564(b)(1) of the Act, 21 U.S.C. section 360bbb-3(b)(1), unless the authorization is terminated or revoked.  Performed at [facility], [address] [place], [place] [address]          Radiology Studies: DG Chest 2 View  Result Date: 09/29/2020 CLINICAL DATA:  Shortness of breath. EXAM: CHEST - 2 VIEW COMPARISON:  Chest x-ray 09/17/2020, CT abdomen pelvis 09/18/2020. FINDINGS: Left to right mediastinal shift. Otherwise the heart size and mediastinal contours are within normal limits. Interval right middle lobe collapse. Interval increase in right upper lobe airspace opacity with underlying pulmonary nodules consistent with metastases. Persistent right lower lobe opacity overlying multiple other pulmonary nodule metastases. Redemonstration of left mid lung zone and left lower lung zone pulmonary nodules. No pulmonary edema. Possible trace right pleural effusion is noted. No definite left pleural effusion. No pneumothorax. No acute osseous abnormality. IMPRESSION: 1. Pulmonary metastases with suggestion of overlying infection/inflammation within the right upper  lung zone. 2. Interval right middle lobe collapse. 3. Possible trace right pleural effusion. Electronically Signed   By: Iven Finn M.D.   On: 09/29/2020 22:48        Scheduled Meds: . amantadine  100 mg Oral BID  . chlorproMAZINE  25 mg Oral TID  . divalproex  500 mg Oral BID  . donepezil  10 mg Oral QHS  . enoxaparin (LOVENOX) injection  40 mg Subcutaneous Q24H  . feeding supplement  237 mL Oral BID BM  . gabapentin  400 mg Oral q morning - 10a  . icosapent Ethyl  2 g Oral BID  . LORazepam  0.5 mg Oral QHS  . memantine  28 mg Oral Daily  . mirtazapine  30  mg Oral QHS  . simvastatin  20 mg Oral q1800   Continuous Infusions: . azithromycin Stopped (10/01/20 0045)  . cefTRIAXone (ROCEPHIN)  IV Stopped (09/30/20 2306)     LOS: 1 day    Time spent: 26 minutes    Sharen Hones, MD [facility]   To contact the attending provider between 7A-7P or the covering provider during after hours 7P-7A, please log into the web site [email or web address] and access using universal [place] password for that web site. If you do not have the password, please call the hospital operator.  10/01/2020, 4:14 PM

## 2020-10-02 DIAGNOSIS — C7801 Secondary malignant neoplasm of right lung: Secondary | ICD-10-CM

## 2020-10-02 DIAGNOSIS — C7802 Secondary malignant neoplasm of left lung: Secondary | ICD-10-CM

## 2020-10-02 MED ORDER — PREDNISONE 10 MG PO TABS: ORAL_TABLET | ORAL | 0 refills | Status: AC

## 2020-10-02 MED ORDER — COMBIVENT RESPIMAT 20-100 MCG/ACT IN AERS: 1.0000 | INHALATION_SPRAY | Freq: Four times a day (QID) | RESPIRATORY_TRACT | 0 refills | Status: AC

## 2020-10-02 MED ORDER — TIOTROPIUM BROMIDE MONOHYDRATE 18 MCG IN CAPS
18.0000 ug | ORAL_CAPSULE | Freq: Every day | RESPIRATORY_TRACT | Status: DC
  Filled 2020-10-02: qty 5

## 2020-10-02 MED ORDER — SODIUM CHLORIDE 0.9 % IV BOLUS
500.0000 mL | Freq: Once | INTRAVENOUS | Status: AC
  Administered 2020-10-02: 05:00:00 500 mL via INTRAVENOUS

## 2020-10-02 MED ORDER — PREDNISONE 50 MG PO TABS: 50.0000 mg | ORAL_TABLET | Freq: Every day | ORAL | Status: DC

## 2020-10-02 MED ORDER — TRELEGY ELLIPTA 200-62.5-25 MCG/INH IN AEPB: 1.0000 | INHALATION_SPRAY | Freq: Two times a day (BID) | RESPIRATORY_TRACT | Status: AC

## 2020-10-02 MED ORDER — FLUTICASONE FUROATE-VILANTEROL 200-25 MCG/INH IN AEPB
1.0000 | INHALATION_SPRAY | Freq: Every day | RESPIRATORY_TRACT | Status: DC
  Filled 2020-10-02: qty 28

## 2020-10-02 MED ORDER — AMOXICILLIN-POT CLAVULANATE 875-125 MG PO TABS: 1.0000 | ORAL_TABLET | Freq: Two times a day (BID) | ORAL | 0 refills | Status: AC

## 2020-10-02 MED ORDER — TIOTROPIUM BROMIDE MONOHYDRATE 18 MCG IN CAPS: 18.0000 ug | ORAL_CAPSULE | Freq: Every day | RESPIRATORY_TRACT | 0 refills | Status: DC

## 2020-10-02 NOTE — NC FL2 (Signed)
[place] LEVEL OF CARE SCREENING TOOL     IDENTIFICATION  Patient Name: Joseph Holt. Birthdate: 12-31-1950 Sex: male Admission Date (Current Location): 09/30/2020  [place] and [place] Number:  Engineering geologist and Address:  [facility], [address] [place], [place] [address]      Provider Number: [id number]  Attending Physician Name and Address:  Sharen Hones, MD  Relative Name and Phone Number:       Current Level of Care: Hospital Recommended Level of Care: Other (Comment) (group home) Prior Approval Number:    Date Approved/Denied:   PASRR Number:    Discharge Plan: Other (Comment) (group home)    Current Diagnoses: Patient Active Problem List   Diagnosis Date Noted  . Sepsis ([place]) 09/30/2020  . Pulmonary metastasis ([place]) 09/30/2020  . Acute on chronic respiratory failure with hypercapnia ([place]) 09/30/2020  . Severe sepsis ([place]) 09/30/2020  . S/P thoracentesis   . Palliative care by specialist   . Goals of care, counseling/discussion   . [name] 09/16/2020  . Metastatic primary lung cancer, left ([place])   . Benign prostatic hyperplasia without lower urinary tract symptoms   . COPD with acute exacerbation ([place])   . Acute on chronic respiratory failure with hypoxia ([place])   . Hyperlipidemia   . Right upper lobe pneumonia 09/14/2020  . Shortness of breath 09/14/2020  . Generalized weakness 09/14/2020  . GERD (gastroesophageal reflux disease) 09/14/2020  . Mild protein-calorie malnutrition ([place]) 12/06/2018  . Mixed simple and mucopurulent chronic bronchitis ([place]) 12/06/2018  . Sleep disturbance 09/06/2018  . Bipolar 1 disorder ([place]) 04/14/2018  . Schizo affective schizophrenia ([place]) 04/14/2018  . Cognitive impairment 01/06/2018  . Tobacco use disorder 01/02/2018  . Paranoid schizophrenia ([place]) 02/21/2015  . Social maladjustment 02/21/2015    Orientation RESPIRATION BLADDER Height &  Weight     Self,Time,Place,Situation  O2 ([place] 3L) Continent Weight: 159 lb 14.4 oz (72.5 kg) Height:  6' (182.9 cm)  BEHAVIORAL SYMPTOMS/MOOD NEUROLOGICAL BOWEL NUTRITION STATUS      Continent Diet (DYS 3 diet, thin liquids)  AMBULATORY STATUS COMMUNICATION OF NEEDS Skin   Independent Verbally Normal                       Personal Care Assistance Level of Assistance  Bathing,Feeding,Dressing Bathing Assistance: Independent Feeding assistance: Independent Dressing Assistance: Independent     Functional Limitations Info  Sight,Hearing,Speech Sight Info: Adequate Hearing Info: Adequate Speech Info: Adequate    SPECIAL CARE FACTORS FREQUENCY                       Contractures Contractures Info: Not present    Additional Factors Info  Code Status,Allergies Code Status Info: full code Allergies Info: no known allergies           Current Medications (10/02/2020):  This is the current hospital active medication list Current Facility-Administered Medications  Medication Dose Route Frequency Provider Last Rate Last Admin  . acetaminophen (TYLENOL) tablet 650 mg  650 mg Oral Q6H PRN Athena Masse, MD   650 mg at 10/01/20 2003   Or  . acetaminophen (TYLENOL) suppository 650 mg  650 mg Rectal Q6H PRN Athena Masse, MD      . amantadine (SYMMETREL) capsule 100 mg  100 mg Oral BID Athena Masse, MD   100 mg at 10/02/20 0932  . azithromycin (ZITHROMAX) 500 mg  in sodium chloride 0.9 % 250 mL IVPB  500 mg Intravenous Q24H Judd Gaudier V, MD 250 mL/hr at 10/02/20 0031 500 mg at 10/02/20 0031  . cefTRIAXone (ROCEPHIN) 2 g in sodium chloride 0.9 % 100 mL IVPB  2 g Intravenous Q24H Judd Gaudier V, MD 200 mL/hr at 10/01/20 2344 2 g at 10/01/20 2344  . chlorproMAZINE (THORAZINE) tablet 25 mg  25 mg Oral TID Athena Masse, MD   25 mg at 10/02/20 0932  . divalproex (DEPAKOTE) DR tablet 500 mg  500 mg Oral BID Athena Masse, MD   500 mg at 10/02/20 0932  . donepezil  (ARICEPT) tablet 10 mg  10 mg Oral QHS Sharen Hones, MD   10 mg at 10/01/20 2339  . enoxaparin (LOVENOX) injection 40 mg  40 mg Subcutaneous Q24H Athena Masse, MD   40 mg at 10/02/20 0932  . feeding supplement (ENSURE ENLIVE / ENSURE PLUS) liquid 237 mL  237 mL Oral BID BM Sharen Hones, MD   237 mL at 10/01/20 1313  . fluticasone furoate-vilanterol (BREO ELLIPTA) 200-25 MCG/INH 1 puff  1 puff Inhalation Daily Sharen Hones, MD      . gabapentin (NEURONTIN) capsule 400 mg  400 mg Oral q morning - 10a Athena Masse, MD   400 mg at 10/02/20 0932  . icosapent Ethyl (VASCEPA) 1 g capsule 2 g  2 g Oral BID Athena Masse, MD   2 g at 10/01/20 2337  . ipratropium-albuterol (DUONEB) 0.5-2.5 (3) MG/3ML nebulizer solution 3 mL  3 mL Nebulization Q4H PRN Athena Masse, MD   3 mL at 10/02/20 0944  . LORazepam (ATIVAN) tablet 0.5 mg  0.5 mg Oral QHS Athena Masse, MD   0.5 mg at 10/01/20 2338  . memantine (NAMENDA XR) 24 hr capsule 28 mg  28 mg Oral Daily Sharen Hones, MD   28 mg at 10/02/20 0932  . mirtazapine (REMERON) tablet 30 mg  30 mg Oral QHS Athena Masse, MD   30 mg at 10/01/20 2338  . ondansetron (ZOFRAN) tablet 4 mg  4 mg Oral Q6H PRN Athena Masse, MD       Or  . ondansetron [facility]) injection 4 mg  4 mg Intravenous Q6H PRN Athena Masse, MD      . Derrill Memo ON 10/03/2020] predniSONE (DELTASONE) tablet 50 mg  50 mg Oral Q breakfast Sharen Hones, MD      . simvastatin (ZOCOR) tablet 20 mg  20 mg Oral q1800 Athena Masse, MD   20 mg at 10/01/20 2003  . tiotropium (SPIRIVA) inhalation capsule ([facility] use ONLY) 18 mcg  18 mcg Inhalation Daily Sharen Hones, MD         Discharge Medications: Please see discharge summary for a list of discharge medications.  Relevant Imaging Results:  Relevant Lab Results:   Additional Information SSN: [place] Rafeef Lau, LCSW

## 2020-10-02 NOTE — Progress Notes (Signed)
SATURATION QUALIFICATIONS: (This note is used to comply with regulatory documentation for home oxygen)  Patient Saturations on Room Air at Rest = 90%  Patient Saturations on Room Air while Ambulating = 82%  Patient Saturations on 3 Liters of oxygen while Ambulating = 99%  Patient oxygenation levels unsustainable on room air. Requires supplemental oxygen to reach therapeutic level.

## 2020-10-02 NOTE — Discharge Summary (Signed)
Physician Discharge Summary  Patient ID: Joseph Holt. MRN: [id number] DOB/AGE: 69/15/1952 69 y.o.  Admit date: 09/30/2020 Discharge date: 10/02/2020  Admission Diagnoses:  Discharge Diagnoses:  Principal Problem:   Acute on chronic respiratory failure with hypoxia ([place]) Active Problems:   Schizo affective schizophrenia ([place])   Right upper lobe pneumonia   Metastatic primary lung cancer, left ([place])   Sepsis ([place])   Pulmonary metastasis ([place])   Discharged Condition: fair  Hospital Course:  Joseph Holtis a 69 y.o.malewith medical history significant forbipolar mood disorder, COPD, nicotine dependence, and recent hospitalization, discharged on 12/15 with diagnosis of CAP, acute respiratory failure with finding of lung metastases, discharged on oxygen at 2 L/min with plans for outpatient oncology work-up who was brought in by EMS with shortness of breath. Chest x-ray showed metastatic lung cancer, with the possibility of inflammation. He is started on antibiotics for hospital care associate pneumonia.   #1.  Acute on chronic hypoxemic respiratory failure. Sepsis. Right upper lobe pneumonia secondary to aspiration pneumonia.Ruled in. COPD exacerbation. POA, ruled in. Patient has been seen by speech therapy, patient has risk of aspiration from pharyngeal phase and esophageal phase.   Suspect achalasia. I will continue antibiotics for today. Patient appeared not to be able to follow instructions.  In my observation, patient had a significant silent aspiration as he was having worsening wheezes after eating. I believe he also has a COPD exacerbation.  He will be treated with Augmentin, and steroids at time of discharge.  We have set up home oxygen already. Patient long-term prognosis is very poor, patient will have recurrent aspiration pneumonia with his metastatic lung cancer.  He will be referred to palliative care at discharge, palliative care was consulted here,  but not able to see the patient. He most likely can be transitioned to hospice care after seen by palliative care at home.  2.  Metastatic lung cancer. Follow-up with oncology as outpatient.  Also obtained palliative care consult. Continue follow-up with oncology.  3.  Schizoaffective disorder. Continue home medicines.   Consults: None  Significant Diagnostic Studies:     Treatments: Abx, speech eval  Discharge Exam: Blood pressure 118/89, pulse (!) 101, temperature 98.2 F (36.8 C), temperature source Oral, resp. rate 19, height 6' (1.829 m), weight 72.5 kg, SpO2 99 %. General appearance: alert and cooperative Chest wall: no tenderness, Rhonchi, just finished eating. Cardio: regular rate and rhythm, S1, S2 normal, no murmur, click, rub or gallop GI: soft, non-tender; bowel sounds normal; no masses,  no organomegaly Extremities: extremities normal, atraumatic, no cyanosis or edema  Disposition: Discharge disposition: 01-Home or Self Care       Discharge Instructions    Amb Referral to Palliative Care   Complete by: As directed    Ambulatory referral to Oncology   Complete by: As directed    Diet general   Complete by: As directed    Diet Dys 3.   Increase activity slowly   Complete by: As directed      Allergies as of 10/02/2020   No Known Allergies     Medication List    TAKE these medications   amantadine 100 MG capsule Commonly known as: SYMMETREL TAKE 1 CAPSULE BY MOUTH TWICE DAILY. **DO NOT CRUSH** What changed: See the new instructions.   amoxicillin-clavulanate 875-125 MG tablet Commonly known as: Augmentin Take 1 tablet by mouth 2 (two) times daily for 7 days.   cetirizine 10 MG tablet Commonly known as: ZYRTEC  TAKE (1) TABLET BY MOUTH ONCE DAILY. What changed: See the new instructions.   chlorproMAZINE 25 MG tablet Commonly known as: THORAZINE TAKE 1 TABLET BY MOUTH 3 TIMES DAILY.   Combivent Respimat 20-100 MCG/ACT Aers  respimat Generic drug: Ipratropium-Albuterol Inhale 1 puff into the lungs every 6 (six) hours.   divalproex 500 MG DR tablet Commonly known as: DEPAKOTE TAKE 1 TABLET BY MOUTH TWICE DAILY. **DO NOT CRUSH** What changed: See the new instructions.   feeding supplement Liqd Take 237 mLs by mouth 3 (three) times daily between meals.   Fluticasone-Salmeterol 250-50 MCG/DOSE Aepb Commonly known as: ADVAIR Inhale 1 puff into the lungs 2 (two) times daily.   gabapentin 400 MG capsule Commonly known as: NEURONTIN TAKE 1 TABLET BY MOUTH EVERY MORNING. What changed:   how much to take  when to take this   LORazepam 0.5 MG tablet Commonly known as: ATIVAN TAKE 1 TABLET BY MOUTH AT BEDTIME.   mirtazapine 30 MG tablet Commonly known as: REMERON TAKE 1 TABLET BY MOUTH AT BEDTIME.   Namzaric 28-10 MG Cp24 Generic drug: Memantine HCl-Donepezil HCl TAKE ONE CAPSULE ONCE A DAY. * DO NOT CRUSH * What changed: See the new instructions.   omeprazole 20 MG capsule Commonly known as: PRILOSEC TAKE ONE CAPSULE BY MOUTH EVERY MORNING *DO NOT CRUSH* What changed: See the new instructions.   predniSONE 10 MG tablet Commonly known as: DELTASONE Take 4 tablets (40 mg total) by mouth daily for 3 days, THEN 2 tablets (20 mg total) daily for 3 days, THEN 1 tablet (10 mg total) daily for 3 days. Start taking on: October 02, 2020   Pulse Oximeter For Finger Misc To check pulse oximeter PRN; If Oxygen is less than 90% he is to wear 2L O2 during day while awake   simvastatin 20 MG tablet Commonly known as: ZOCOR TAKE 1 TABLET BY MOUTH AT BEDTIME.   tamsulosin 0.4 MG Caps capsule Commonly known as: FLOMAX TAKE (1) CAPSULE BY MOUTH AT BEDTIME.*DO NOT CRUSH* What changed:   how much to take  how to take this  when to take this  additional instructions   tiotropium 18 MCG inhalation capsule Commonly known as: SPIRIVA Place 1 capsule (18 mcg total) into inhaler and inhale daily.    Vascepa 1 g capsule Generic drug: icosapent Ethyl TAKE 2 CAPSULES BY MOUTH TWO TIMES A DAY What changed: See the new instructions.   vitamin C 1000 MG tablet TAKE ONE TABLET BY MOUTH EVERY DAY   Vitamin D3 1.25 MG (50000 UT) Caps Take 50,000 Units by mouth once a week.            Durable Medical Equipment  (From admission, onward)         Start     Ordered   10/01/20 1620  For home use only DME oxygen  Once       Question Answer Comment  Length of Need Lifetime   Mode or (Route) Nasal cannula   Liters per Minute 2   Frequency Continuous (stationary and portable oxygen unit needed)   Oxygen conserving device Yes   Oxygen delivery system Gas      10/01/20 1620          Follow-up Information    Mar Daring, PA-C Follow up in 1 week(s).   Specialty: Family Medicine Contact information: [place] [place] [address] [phone]  38 minutes Signed: Sharen Hones 10/02/2020, 9:40 AM

## 2020-10-02 NOTE — Progress Notes (Signed)
[facility] hospital Liaison note:  New referral for [facility] outpatient Palliative to follow at [facility] received from [place]. Plan is for discharge today. Thank you for this referral.  Flo Shanks BSN, RN, [place] [phone]

## 2020-10-02 NOTE — TOC Transition Note (Signed)
Transition of Care [facility]) - CM/SW Discharge Note   Patient Details  Name: Joseph Holt. MRN: [id number] Date of Birth: 1950/10/17  [facility] CM/SW Contact:  Eileen Stanford, LCSW Phone Number: 10/02/2020, 10:08 AM   Clinical Narrative:   Pt has a [place] RN arranged through [facility] [employer] will deliver 02 to the pts room. Pt will dc today. Transport arranged through Group Home--CSW spoke with Ivin Booty to confirm--she will be here in about a hour. RN notified.    Final next level of care: Group Home Barriers to Discharge: No Barriers Identified   Patient Goals and CMS Choice        Discharge Placement              Patient chooses bed at:  (Group Home) Patient to be transferred to facility by: [facility]   Patient and family notified of of transfer: 10/02/20  Discharge Plan and Services In-house Referral: Clinical Social Work   Post Acute Care Choice: [place] (O2)          DME Arranged: Oxygen DME Agency: [employer] Date DME Agency Contacted: 10/02/20 Time DME Agency Contacted: [id number] Representative spoke with at DME Agency: zach Iselin: RN [place] Agency: [place] ([place]) Date [place]: 10/02/20 Time [place]: [id number] Representative spoke with at [place]: [place] ([place]) Interventions     Readmission Risk Interventions No flowsheet data found.

## 2020-10-03 ENCOUNTER — Encounter: Admission: RE | Admit: 2020-10-03 | Payer: Medicare Other | Source: Ambulatory Visit

## 2020-10-03 ENCOUNTER — Telehealth: Payer: Self-pay

## 2020-10-03 ENCOUNTER — Telehealth: Payer: Self-pay | Admitting: Physician Assistant

## 2020-10-03 NOTE — Telephone Encounter (Signed)
Denise from [employer] palliative care called and stated that pt was discharged from [facility] and they recommended Palliative care along with his home health services/ Please advise if Palliative care is ok with Phineas Semen call back # [phone] option #2

## 2020-10-03 NOTE — Telephone Encounter (Signed)
Yes I think this could be very beneficial

## 2020-10-03 NOTE — Telephone Encounter (Signed)
Transition Care Management Follow-up Telephone Call  Date of discharge and from where: 10/02/20 from [facility]  How have you been since you were released from the hospital? Not eating much but drinking the Ensure very well. Resting well. Oxygen 2L. Difficulty following instructions to keep oxygen on. Still weak. Declines pain, fever,congestion, and n/v/d.  Any questions or concerns? No  Items Reviewed:  Did the pt receive and understand the discharge instructions provided? Yes   Medications obtained and verified? Yes   Other? Oxygen 2L  Any new allergies since your discharge? No   Dietary orders reviewed? Yes, Dys 3.  Do you have support at home? Yes , home care.  Functional Questionnaire: (I = Independent and D = Dependent)  Bathing/Dressing- D/staff assist while weak  Meal Prep- D/staff prepares meals  Eating- i  Maintaining continence- i  Transferring/Ambulation- i  Managing Meds- staff assist  Follow up appointments reviewed:   PCP Hospital f/u appt confirmed? Yes  Scheduled to see Fenton Malling on 10/10/20 @ 2:40 PM.  [place] [facility] f/u appt confirmed? Yes    Are transportation arrangements needed? No   If their condition worsens, is the pt aware to call PCP or go to the Emergency Dept.? Yes  Was the patient provided with contact information for the PCP's office or ED? Yes  Was to pt encouraged to call back with questions or concerns? Yes

## 2020-10-04 NOTE — Telephone Encounter (Signed)
Joseph Holt with Palliative Care advised.

## 2020-10-05 ENCOUNTER — Telehealth: Payer: Self-pay | Admitting: Nurse Practitioner

## 2020-10-05 LAB — CULTURE, BLOOD (ROUTINE X 2)
Culture: NO GROWTH
Culture: NO GROWTH

## 2020-10-05 NOTE — Telephone Encounter (Signed)
Spoke with the Manager of the [place], Chrystie Nose and have scheduled an [place] for 10/16/20 @ 3:15 PM.

## 2020-10-10 ENCOUNTER — Inpatient Hospital Stay: Payer: Medicare Other | Admitting: Physician Assistant

## 2020-10-11 ENCOUNTER — Other Ambulatory Visit: Payer: Self-pay | Admitting: Physician Assistant

## 2020-10-11 DIAGNOSIS — E7800 Pure hypercholesterolemia, unspecified: Secondary | ICD-10-CM

## 2020-10-11 DIAGNOSIS — E781 Pure hyperglyceridemia: Secondary | ICD-10-CM

## 2020-10-12 ENCOUNTER — Telehealth: Payer: Self-pay | Admitting: *Deleted

## 2020-10-12 NOTE — Telephone Encounter (Signed)
Received call from pt's brother asking for any updates on patient since was recently in the hospital. Updated provided to brother and informed that pt's PET scan has been rescheduled to Thurs 1/13 since he was in the hospital when his PET was originally scheduled. Informed that PET scan will help guide next steps regarding biopsy and/or any treatment options. Will discuss with Dr. B next week to question if pt requires any follow up after PET scan. Pt has scheduled home visit with palliative care next week as well. Per pt's brother, he stated that after he last spoke with patient the patient is not very interested in pursuing chemotherapy treatment. Informed that he can discuss goals of care with palliative care NP at home visit and with Dr. B at future follow up visit. All further questions answered during call. Instructed to call back if has any further questions or needs. [place] Ma verbalized understanding. Nothing further needed at this time.

## 2020-10-16 ENCOUNTER — Encounter: Payer: Self-pay | Admitting: Nurse Practitioner

## 2020-10-16 ENCOUNTER — Other Ambulatory Visit: Payer: Medicare Other | Admitting: Nurse Practitioner

## 2020-10-16 ENCOUNTER — Other Ambulatory Visit: Payer: Self-pay

## 2020-10-16 DIAGNOSIS — C3492 Malignant neoplasm of unspecified part of left bronchus or lung: Secondary | ICD-10-CM

## 2020-10-16 DIAGNOSIS — Z515 Encounter for palliative care: Secondary | ICD-10-CM

## 2020-10-16 LAB — FUNGUS CULTURE WITH STAIN

## 2020-10-16 LAB — FUNGUS CULTURE RESULT

## 2020-10-16 LAB — FUNGAL ORGANISM REFLEX

## 2020-10-16 NOTE — Progress Notes (Signed)
[facility] Consult Note Telephone: [phone]  Fax: [phone]  PATIENT NAME: Joseph Holt. DOB: March 12, 1951 MRN: [id number]  PRIMARY CARE PROVIDER:   Rubye Beach  REFERRING PROVIDER:  Mar Daring, PA-C [place],   [id number]  RESPONSIBLE PARTY:   Self; Manager of Group Home:  Chrystie Nose  group home: [phone]  cell:  [phone]  I was asked by Fenton Malling PA to see Joseph Holt for Palliative consult for complex medical goals of care 1. Advance Care Planning;    2. Goals of Care: Goals include to maximize quality of life and symptom management. Our advance care planning conversation included a discussion about:     The value and importance of advance care planning   Exploration of personal, cultural or spiritual beliefs that might influence medical decisions   Exploration of goals of care in the event of a sudden injury or illness   Identification and preparation of a healthcare agent   Review and updating or creation of an  advance directive document.   3. Palliative care encounter; Palliative care encounter; Palliative medicine team will continue to support patient, patient's family, and medical team. Visit consisted of counseling and education dealing with the complex and emotionally intense issues of symptom management and palliative care in the setting of serious and potentially life-threatening illness   4. f/u 1 month for ongoing monitoring chronic disease progression, ongoing discussions complex medical decision making  I spent 90 minutes providing this consultation,  from 3:00pm to 4:30pm. More than 50% of the time in this consultation was spent coordinating communication.   HISTORY OF PRESENT ILLNESS:  Joseph Holt. is a 70 y.o. year old male with multiple medical problems including Pulmonary metastases primary left lung cancer, COPD, BPH,  gerd, tobacco abuse, mild protein calorie malnutrition, bipolar, hernia repair, appendectomy. Hospitalize 12 / 07/2020 to 12 / 15 / 2021 / large left upper lobe mass with multiple nodules suspected cancer work up to be done outpatient multifocal pneumonia community-acquired, acute hypoxic respiratory failure. COPD exacerbation. Bipolar and schizophrenic disorder Psychiatry did evaluate. Hospitalized 09/30/2020 to 12 / 28 / 2021 for acute on chronic hypoxic respiratory failure, sepsis right upper lobe pneumonia secondary to aspiration pneumonia. COPD exacerbation. I contact Ms Darnell Level to confirm Palliative care appointment and covid screening which was negative. Ms Darnell Level and I talked about purpose of Palliative care visit. We talked about past medical history of Joseph. Darnell Level. We talked about chronic disease progression. We talked about recent hospitalization. We talked about cancer diagnosis. We talked about upcoming appointment with oncology for further exploration of treatment options. We talked about his functional level. Ms Darnell Level endorses Joseph Holt has been staying in bed, making it more difficult for him to get out of bed. Ms. Darnell Level endorses that Joseph Holt has to be bathed and dressed. Joseph Holt is having episodes of incontinence of bowel. Joseph Holt does wear an adult diaper that is changed in soiled with urine intermittently. Joseph Holt does required to be assisted with feeding his appetite remains poor. Ms Darnell Level endorses they have been giving Joseph Holt supplements which has helped. Joseph Holt has lost a significant amount of weight. Ms Darnell Level and I talked about medical goals of care. At present Joseph Holt is a full code. Ms Darnell Level endorses Joseph Holt does have a brother has not been involved. Ms Darnell Level endorses she has been assisting Joseph Holt with  decision-making during hospitalization. We talked about code status. Mrs. Darnell Level in agreement to Palliative having discussion with Joseph Holt about code status during Palliative  care visit. I visited and observe Joseph Holt. Joseph Holt was lying in his bed in his room. Joseph Holt appears weak, pale. Joseph Holt does make eye contact with verbal cues and answers questions. Joseph Holt has a very soft voice. Joseph Holt was cooperative with assessment. We talked about how he is feeling today. Joseph Holt endorses that he is doing okay.  Joseph Holt endorses he is just having a little more trouble sleeping. We talked about sleep hygiene and patterns. We talked about symptoms of pain which he denies. We talked about symptoms of shortness of breath which he is experiencing intermittently. Joseph Holt does wear continuous oxygen for which he came home from the hospital with. We talked about this cancer diagnosis. We talked about his upcoming appointment with oncology. We talked about how he feels about the cancer diagnosis. Joseph Holt endorses he is not sure what to expect but he is hopeful. Joseph Holt endorses comfort is important to him. We talked about medical goals of care. We talked about code status. Joseph Holt endorses he would not want to have CPR. Joseph Holt in agreement to have DNR completed. [place] form completed and will place them [place]. Caregiver at care home was in the room during the time of Palliative discussion of code status. We talked about quality of life. Joseph Holt talked about things that he likes. We talked about quality of life. We talked about life review briefly. Joseph Holt endorse is he has never been married or has children. Joseph Holt endorse is he likes to lay in his room. The TV was on but he does not seem to be engaged by that. Joseph Holt talked about the ensure that he likes to drink, chocolate flavor is appealing to him. We talked about role of Palliative care and plan of care. We talked about follow-up Palliative care visit after oncology visit. Joseph Holt in agreement, appointment schedule. Therapeutic listening, emotional support provided. Contact information. I called Ms Darnell Level,  message left about DNR form completed and return contact information, update on Palliative care visit  Palliative Care was asked to help address goals of care.   CODE STATUS: DNR  PPS: 40% HOSPICE ELIGIBILITY/DIAGNOSIS: TBD  PAST MEDICAL HISTORY:  Past Medical History:  Diagnosis Date  . Bipolar 1 disorder ([place])   . Schizo affective schizophrenia ([place])     SOCIAL HX:  Social History   Tobacco Use  . Smoking status: Current Every Day Smoker    Packs/day: 0.20    Types: Cigarettes  . Smokeless tobacco: Never Used  . Tobacco comment: pt smokes 1 cigarette a day  Substance Use Topics  . Alcohol use: No    ALLERGIES: No Known Allergies   PERTINENT MEDICATIONS:  Outpatient Encounter Medications as of 10/16/2020  Medication Sig  . amantadine (SYMMETREL) 100 MG capsule TAKE 1 CAPSULE BY MOUTH TWICE DAILY. **DO NOT CRUSH** (Patient taking differently: Take 100 mg by mouth 2 (two) times daily.)  . Ascorbic Acid (VITAMIN C) 1000 MG tablet TAKE ONE TABLET BY MOUTH EVERY DAY (Patient taking differently: Take 1,000 mg by mouth daily.)  . cetirizine (ZYRTEC) 10 MG tablet TAKE (1) TABLET BY MOUTH ONCE DAILY. (Patient taking differently: Take 10 mg by mouth daily.)  . chlorproMAZINE (THORAZINE) 25 MG tablet TAKE 1 TABLET BY MOUTH 3 TIMES DAILY. (Patient taking differently: Take  25 mg by mouth 3 (three) times daily.)  . Cholecalciferol (VITAMIN D3) 1.25 MG (50000 UT) CAPS Take 50,000 Units by mouth once a week.  . divalproex (DEPAKOTE) 500 MG DR tablet TAKE 1 TABLET BY MOUTH TWICE DAILY. **DO NOT CRUSH** (Patient taking differently: Take 500 mg by mouth 2 (two) times daily.)  . feeding supplement (ENSURE ENLIVE / ENSURE PLUS) LIQD Take 237 mLs by mouth 3 (three) times daily between meals. (Patient not taking: No sig reported)  . Fluticasone-Umeclidin-Vilant (TRELEGY ELLIPTA) 200-62.5-25 MCG/INH AEPB Inhale 1 Dose into the lungs in the morning and at bedtime. Resume previous dose, confirmed with  pharmacy  . gabapentin (NEURONTIN) 400 MG capsule TAKE 1 TABLET BY MOUTH EVERY MORNING. (Patient taking differently: Take 400 mg by mouth daily.)  . Ipratropium-Albuterol (COMBIVENT RESPIMAT) 20-100 MCG/ACT AERS respimat Inhale 1 puff into the lungs every 6 (six) hours.  Marland Kitchen LORazepam (ATIVAN) 0.5 MG tablet TAKE 1 TABLET BY MOUTH AT BEDTIME. (Patient taking differently: Take 0.5 mg by mouth at bedtime.)  . mirtazapine (REMERON) 30 MG tablet TAKE 1 TABLET BY MOUTH AT BEDTIME. (Patient taking differently: Take 30 mg by mouth at bedtime.)  . Misc. Devices (PULSE OXIMETER FOR FINGER) MISC To check pulse oximeter PRN; If Oxygen is less than 90% he is to wear 2L O2 during day while awake  . NAMZARIC 28-10 MG CP24 TAKE ONE CAPSULE ONCE A DAY. * DO NOT CRUSH * (Patient taking differently: Take 1 capsule by mouth daily.)  . omeprazole (PRILOSEC) 20 MG capsule TAKE ONE CAPSULE BY MOUTH EVERY MORNING *DO NOT CRUSH* (Patient taking differently: Take 20 mg by mouth daily.)  . simvastatin (ZOCOR) 20 MG tablet TAKE 1 TABLET BY MOUTH AT BEDTIME. (Patient taking differently: Take 20 mg by mouth at bedtime.)  . tamsulosin (FLOMAX) 0.4 MG CAPS capsule TAKE (1) CAPSULE BY MOUTH AT BEDTIME.*DO NOT CRUSH* (Patient taking differently: Take 0.4 mg by mouth at bedtime.)  . VASCEPA 1 g capsule TAKE (2) CAPSULES BY MOUTH TWICE DAILY.   No facility-administered encounter medications on file as of 10/16/2020.    PHYSICAL EXAM:   General: NAD, frail appearing, thin, debilitated, pale, chronically ill male Cardiovascular: regular rate and rhythm Pulmonary: clear ant fields Extremities: no edema, no joint deformities; muscle wasting Neurological: generalized weakness, difficulty ambulating  Joshawa Dubin Ihor Gully, NP

## 2020-10-18 ENCOUNTER — Other Ambulatory Visit: Payer: Self-pay

## 2020-10-18 ENCOUNTER — Ambulatory Visit
Admission: RE | Admit: 2020-10-18 | Discharge: 2020-10-18 | Disposition: A | Discharge status: Home / Self Care | Payer: Medicare Other | Source: Ambulatory Visit | Attending: Internal Medicine | Admitting: Internal Medicine

## 2020-10-18 DIAGNOSIS — I714 Abdominal aortic aneurysm, without rupture: Secondary | ICD-10-CM | POA: Diagnosis not present

## 2020-10-18 DIAGNOSIS — C7971 Secondary malignant neoplasm of right adrenal gland: Secondary | ICD-10-CM | POA: Insufficient documentation

## 2020-10-18 DIAGNOSIS — I2510 Atherosclerotic heart disease of native coronary artery without angina pectoris: Secondary | ICD-10-CM | POA: Diagnosis not present

## 2020-10-18 DIAGNOSIS — J439 Emphysema, unspecified: Secondary | ICD-10-CM | POA: Diagnosis not present

## 2020-10-18 DIAGNOSIS — I700 Atherosclerosis of aorta: Secondary | ICD-10-CM | POA: Diagnosis not present

## 2020-10-18 DIAGNOSIS — R918 Other nonspecific abnormal finding of lung field: Secondary | ICD-10-CM | POA: Insufficient documentation

## 2020-10-18 DIAGNOSIS — C7972 Secondary malignant neoplasm of left adrenal gland: Secondary | ICD-10-CM | POA: Diagnosis not present

## 2020-10-18 DIAGNOSIS — C7951 Secondary malignant neoplasm of bone: Secondary | ICD-10-CM | POA: Diagnosis not present

## 2020-10-18 LAB — GLUCOSE, CAPILLARY: Glucose-Capillary: 103 mg/dL — ABNORMAL HIGH (ref 70–99)

## 2020-10-18 MED ORDER — FLUDEOXYGLUCOSE F - 18 (FDG) INJECTION
8.2000 | Freq: Once | INTRAVENOUS | Status: AC | PRN
  Administered 2020-10-18: 8.33 via INTRAVENOUS

## 2020-10-22 ENCOUNTER — Inpatient Hospital Stay: Payer: Medicare Other | Admitting: Physician Assistant

## 2020-10-23 ENCOUNTER — Telehealth: Payer: Self-pay

## 2020-10-23 NOTE — Telephone Encounter (Signed)
Joseph Holt dropped off forms that she is requesting to be completed. Joseph Holt stated that she is pt's caregiver and is with [facility]. Forms placed in Jenni's box and medical records has copy on file. Please advise. Thanks TNP

## 2020-10-23 NOTE — Telephone Encounter (Signed)
FYI

## 2020-10-24 ENCOUNTER — Encounter: Payer: Self-pay | Admitting: *Deleted

## 2020-10-24 ENCOUNTER — Telehealth: Payer: Self-pay | Admitting: *Deleted

## 2020-10-24 ENCOUNTER — Inpatient Hospital Stay: Payer: Medicare Other | Admitting: Hospice and Palliative Medicine

## 2020-10-24 ENCOUNTER — Inpatient Hospital Stay: Payer: Medicare Other | Attending: Internal Medicine | Admitting: Internal Medicine

## 2020-10-24 DIAGNOSIS — F209 Schizophrenia, unspecified: Secondary | ICD-10-CM | POA: Insufficient documentation

## 2020-10-24 DIAGNOSIS — F319 Bipolar disorder, unspecified: Secondary | ICD-10-CM | POA: Insufficient documentation

## 2020-10-24 DIAGNOSIS — C7951 Secondary malignant neoplasm of bone: Secondary | ICD-10-CM | POA: Insufficient documentation

## 2020-10-24 DIAGNOSIS — R918 Other nonspecific abnormal finding of lung field: Secondary | ICD-10-CM

## 2020-10-24 DIAGNOSIS — C7972 Secondary malignant neoplasm of left adrenal gland: Secondary | ICD-10-CM | POA: Diagnosis not present

## 2020-10-24 DIAGNOSIS — C7971 Secondary malignant neoplasm of right adrenal gland: Secondary | ICD-10-CM | POA: Diagnosis not present

## 2020-10-24 DIAGNOSIS — C3412 Malignant neoplasm of upper lobe, left bronchus or lung: Secondary | ICD-10-CM | POA: Insufficient documentation

## 2020-10-24 DIAGNOSIS — C771 Secondary and unspecified malignant neoplasm of intrathoracic lymph nodes: Secondary | ICD-10-CM | POA: Diagnosis not present

## 2020-10-24 NOTE — Telephone Encounter (Signed)
Spoke in length to pt's brother, Rayburn Ma, and his wife regarding pt's recent PET scan results and prognosis as discussed at visit today with Dr. Rogue Bussing. All questions answered during call. Reviewed recommendations for pt to continue with palliative care services at home with eventual transition to hospice.   Informed Rayburn Ma that Dr. B will give him a call later this afternoon to further discuss results and prognosis. Rayburn Ma verbalized understanding.

## 2020-10-24 NOTE — Assessment & Plan Note (Addendum)
#  70 year old male patient with a history of schizophrenia/bipolar group home resident with recent CT scan shows multifocal pneumonia; also left upper lobe mass; mediastinal adenopathy and bilateral lung nodules. JAN 2022- PET scan- FDG avid cavitary mass within the posteromedial right lung base is identified compatible with primary bronchogenic carcinoma. Numerous bilateral FDG avid pulmonary nodules are identified compatible with metastatic disease; FDG avid right hilar, and bilateral mediastinal nodal metastases;  Bilateral adrenal gland metastases;  Multifocal FDG avid bone metastases  #Psychiatric illness: Schizophrenia/bipolar disorder s/p evaluation with psychiatry-patient deemed competent.   #I had a long discussion with the patient and his caregiver regarding the ominous findings noted on the PET scan concerning for metastatic disease.  Discussed with them that he is not a good candidate for any subsequent biopsies/or chemotherapy.  Given his poor performance status/underlying schizophrenia-multiple comorbidities he would be at extremely high risk of complications from biopsy/any further therapies.  Introduced the hospice philosophy with the patient and caregiver.  Patient has been evaluated by palliative care outpatient basis.  We will reach out to Christin regarding the above recommendations.   # DISPOSITION: # Follow up as needed-Dr.B  Cc; [place], Jeniffer

## 2020-10-24 NOTE — Progress Notes (Signed)
[place] NOTE  Patient Care Team: Mar Daring, PA-C as PCP - General (Family Medicine) Telford Nab, RN as Oncology Nurse Navigator  CHIEF COMPLAINTS/PURPOSE OF CONSULTATION: lung nodule/mass  # DEC-Jan 2022-bilateral lung masses; adrenal metastases bone metastases  #Schizophrenia/group home resident; history of smoking COPD/chronic respiratory failure 3 L of oxygen Oncology History   No history exists.    HISTORY OF PRESENTING ILLNESS: Accompanied by his caregiver. Joseph Holt. 70 y.o.  male history of smoking; schizophrenia/group home resident is here for follow-up of his recently diagnosed "lung cancer" based on imaging is here for follow-up/review results of the PET scan.  Patient was admitted to hospital in early December 2021 for worsening shortness of breath and cough- On the CT scan hospital patient noted to have multifocal pneumonia; right more than left.  However patient also noted to have left upper lobe lung mass approximately 4 cm in size; bilateral mediastinal neuropathy; bilateral lung nodules.  Patient was evaluated by pulmonary.  Recommended outpatient endobronchial/lymph node biopsy.  However given patient's poor respiratory status-it was felt the patient would benefit from a PET scan for further evaluation/biopsy planning.  In the interim patient was admitted to hospital twice for COPD exacerbation.   Patient continues to complain of worsening shortness of breath.  Complains of cough.  No hemoptysis.  No pain.  Review of Systems  Constitutional: Positive for malaise/fatigue and weight loss. Negative for chills and diaphoresis.  HENT: Negative for nosebleeds and sore throat.   Eyes: Negative for double vision.  Respiratory: Positive for cough, sputum production and shortness of breath. Negative for hemoptysis and wheezing.   Cardiovascular: Negative for chest pain, palpitations, orthopnea and leg swelling.   Gastrointestinal: Negative for abdominal pain, blood in stool, constipation, diarrhea, heartburn, melena, nausea and vomiting.  Genitourinary: Negative for dysuria, frequency and urgency.  Musculoskeletal: Negative for back pain and joint pain.  Skin: Negative.  Negative for itching and rash.  Neurological: Positive for tremors. Negative for dizziness, tingling, focal weakness, weakness and headaches.  Endo/Heme/Allergies: Does not bruise/bleed easily.  Psychiatric/Behavioral: Negative for depression. The patient is not nervous/anxious and does not have insomnia.      MEDICAL HISTORY:  Past Medical History:  Diagnosis Date   Bipolar 1 disorder (HCC)    BPH (benign prostatic hyperplasia)    COPD (chronic obstructive pulmonary disease) (HCC)    GERD (gastroesophageal reflux disease)    Lung mass    Mild protein-calorie malnutrition (HCC)    Schizo affective schizophrenia ([place])    Tobacco abuse     SURGICAL HISTORY: Past Surgical History:  Procedure Laterality Date   APPENDECTOMY     HERNIA REPAIR      SOCIAL HISTORY: Social History   Socioeconomic History   Marital status: Single    Spouse name: Not on file   Number of children: Not on file   Years of education: Not on file   Highest education level: Not on file  Occupational History   Not on file  Tobacco Use   Smoking status: Current Every Day Smoker    Packs/day: 0.20    Types: Cigarettes   Smokeless tobacco: Never Used   Tobacco comment: pt smokes 1 cigarette a day  Substance and Sexual Activity   Alcohol use: No   Drug use: No   Sexual activity: Not on file  Other Topics Concern   Not on file  Social History Narrative   Patient is originally from [place].  He has been in [place] for many years.  He has 4 brothers; Erlene Quan in [place]; another brother [place].  Younger brother is estranged.  Patient is not married no children.  He lives in a group home.  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  Longstanding history of smoking.    Social Determinants of Health   Financial Resource Strain: Not on file  Food Insecurity: Not on file  Transportation Needs: Not on file  Physical Activity: Not on file  Stress: Not on file  Social Connections: Not on file  Intimate Partner Violence: Not on file    FAMILY HISTORY: No family history on file.  ALLERGIES:  has No Known Allergies.  MEDICATIONS:  Current Outpatient Medications  Medication Sig Dispense Refill   ADVAIR DISKUS 250-50 MCG/DOSE AEPB Inhale 1 puff into the lungs 2 (two) times daily.     amantadine (SYMMETREL) 100 MG capsule TAKE 1 CAPSULE BY MOUTH TWICE DAILY. **DO NOT CRUSH** (Patient taking differently: Take 100 mg by mouth 2 (two) times daily.) 60 capsule 11   Ascorbic Acid (VITAMIN C) 1000 MG tablet TAKE ONE TABLET BY MOUTH EVERY DAY (Patient taking differently: Take 1,000 mg by mouth daily.) 30 tablet 3   cetirizine (ZYRTEC) 10 MG tablet TAKE (1) TABLET BY MOUTH ONCE DAILY. (Patient taking differently: Take 10 mg by mouth daily.) 30 tablet 11   chlorproMAZINE (THORAZINE) 25 MG tablet TAKE 1 TABLET BY MOUTH 3 TIMES DAILY. (Patient taking differently: Take 25 mg by mouth 3 (three) times daily.) 90 tablet 11   Cholecalciferol (VITAMIN D3) 1.25 MG (50000 UT) CAPS Take 50,000 Units by mouth once a week.     divalproex (DEPAKOTE) 500 MG DR tablet TAKE 1 TABLET BY MOUTH TWICE DAILY. **DO NOT CRUSH** (Patient taking differently: Take 500 mg by mouth 2 (two) times daily.) 60 tablet 11   feeding supplement (ENSURE ENLIVE / ENSURE PLUS) LIQD Take 237 mLs by mouth 3 (three) times daily between meals. 237 mL 12   Fluticasone-Umeclidin-Vilant (TRELEGY ELLIPTA) 200-62.5-25 MCG/INH AEPB Inhale 1 Dose into the lungs in the morning and at bedtime. Resume previous dose, confirmed with pharmacy     gabapentin (NEURONTIN) 400 MG capsule TAKE 1 TABLET BY MOUTH EVERY MORNING. (Patient taking differently: Take 400 mg  by mouth daily.) 30 capsule 11   Ipratropium-Albuterol (COMBIVENT RESPIMAT) 20-100 MCG/ACT AERS respimat Inhale 1 puff into the lungs every 6 (six) hours. 4 g 0   meloxicam (MOBIC) 15 MG tablet Take 15 mg by mouth daily.     mirtazapine (REMERON) 30 MG tablet TAKE 1 TABLET BY MOUTH AT BEDTIME. (Patient taking differently: Take 30 mg by mouth at bedtime.) 90 tablet 1   Misc. Devices (PULSE OXIMETER FOR FINGER) MISC To check pulse oximeter PRN; If Oxygen is less than 90% he is to wear 2L O2 during day while awake 1 each 0   NAMZARIC 28-10 MG CP24 TAKE ONE CAPSULE ONCE A DAY. * DO NOT CRUSH * (Patient taking differently: Take 1 capsule by mouth daily.) 30 capsule 3   omeprazole (PRILOSEC) 20 MG capsule TAKE ONE CAPSULE BY MOUTH EVERY MORNING *DO NOT CRUSH* (Patient taking differently: Take 20 mg by mouth daily.) 30 capsule 3   QUEtiapine (SEROQUEL XR) 300 MG 24 hr tablet Take 1 tablet by mouth daily.     simvastatin (ZOCOR) 20 MG tablet TAKE 1 TABLET BY MOUTH AT BEDTIME. (Patient taking differently: Take 20 mg by mouth at bedtime.) 90 tablet  1   tamsulosin (FLOMAX) 0.4 MG CAPS capsule TAKE (1) CAPSULE BY MOUTH AT BEDTIME.*DO NOT CRUSH* (Patient taking differently: Take 0.4 mg by mouth at bedtime.) 30 capsule 11   VASCEPA 1 g capsule TAKE (2) CAPSULES BY MOUTH TWICE DAILY. 120 capsule 2   LORazepam (ATIVAN) 0.5 MG tablet TAKE 1 TABLET BY MOUTH AT BEDTIME. (Patient not taking: Reported on 10/24/2020) 30 tablet 5   No current facility-administered medications for this visit.      Marland Kitchen  PHYSICAL EXAMINATION: ECOG PERFORMANCE STATUS: 3 - Symptomatic, >50% confined to bed  Vitals:   10/24/20 1433  BP: 101/73  Pulse: (!) 117  Resp: (!) 26  Temp: (!) 97.3 F (36.3 C)  SpO2: 93%   Filed Weights   10/24/20 1433  Weight: 150 lb (68 kg)    Physical Exam Constitutional:      Comments: Frail-appearing Caucasian male patient.  He is in a wheelchair.  On nasal cannula oxygen.  He is  accompanied by his caregiver.  HENT:     Head: Normocephalic and atraumatic.     Mouth/Throat:     Mouth: Oropharynx is clear and moist.     Pharynx: No oropharyngeal exudate.  Eyes:     Pupils: Pupils are equal, round, and reactive to light.  Cardiovascular:     Comments: Tachycardic. Pulmonary:     Effort: No respiratory distress.     Breath sounds: No wheezing.     Comments: Decreased breath sounds right more than left. Abdominal:     General: Bowel sounds are normal. There is no distension.     Palpations: Abdomen is soft. There is no mass.     Tenderness: There is no abdominal tenderness. There is no guarding or rebound.  Musculoskeletal:        General: No tenderness or edema. Normal range of motion.     Cervical back: Normal range of motion and neck supple.  Skin:    General: Skin is warm.  Neurological:     Mental Status: He is alert and oriented to person, place, and time.     Comments: Bilateral upper extremity tremors.  Patient difficulty drinking, water.  Psychiatric:        Mood and Affect: Affect normal.      LABORATORY DATA:  I have reviewed the data as listed Lab Results  Component Value Date   WBC 9.0 10/01/2020   HGB 11.1 (L) 10/01/2020   HCT 36.5 (L) 10/01/2020   MCV 95.1 10/01/2020   PLT 296 10/01/2020   Recent Labs    01/02/20 1035 01/02/20 1035 09/14/20 1438 09/15/20 0523 09/29/20 2221 10/01/20 0545  NA 143   < > 140 138 134* 137  K 4.3  --  4.0 3.9 4.8 4.4  CL 104  --  101 104 98 100  CO2 24  --  26 27 26 28   GLUCOSE 124*   < > 135* 94 165* 94  BUN 13   < > 23 17 24* 15  CREATININE 1.10  --  1.04 0.85 1.19 0.86  CALCIUM 9.3  --  8.6* 8.3* 8.9 8.9  GFRNONAA 68   < > >60 >60 >60 >60  GFRAA 79  --   --   --   --   --   PROT 7.0  --  6.7 5.6* 7.3  --   ALBUMIN 4.5  --  2.8* 2.3* 2.9*  --   AST 18  --  20 13* 25  --  ALT 15  --  15 13 27   --   ALKPHOS 116  --  64 53 72  --   BILITOT 0.3  --  0.7 0.5 0.4  --    < > = values in this  interval not displayed.    RADIOGRAPHIC STUDIES: I have personally reviewed the radiological images as listed and agreed with the findings in the report. DG Chest 2 View  Result Date: 09/29/2020 CLINICAL DATA:  Shortness of breath. EXAM: CHEST - 2 VIEW COMPARISON:  Chest x-ray 09/17/2020, CT abdomen pelvis 09/18/2020. FINDINGS: Left to right mediastinal shift. Otherwise the heart size and mediastinal contours are within normal limits. Interval right middle lobe collapse. Interval increase in right upper lobe airspace opacity with underlying pulmonary nodules consistent with metastases. Persistent right lower lobe opacity overlying multiple other pulmonary nodule metastases. Redemonstration of left mid lung zone and left lower lung zone pulmonary nodules. No pulmonary edema. Possible trace right pleural effusion is noted. No definite left pleural effusion. No pneumothorax. No acute osseous abnormality. IMPRESSION: 1. Pulmonary metastases with suggestion of overlying infection/inflammation within the right upper lung zone. 2. Interval right middle lobe collapse. 3. Possible trace right pleural effusion. Electronically Signed   By: Iven Finn M.D.   On: 09/29/2020 22:48   NM PET Image Initial (PI) Skull Base To Thigh  Result Date: 10/18/2020 CLINICAL DATA:  Initial treatment strategy for lung cancer. EXAM: NUCLEAR MEDICINE PET SKULL BASE TO THIGH TECHNIQUE: 8.3 mCi F-18 FDG was injected intravenously. Full-ring PET imaging was performed from the skull base to thigh after the radiotracer. CT data was obtained and used for attenuation correction and anatomic localization. Fasting blood glucose: 103 mg/dl COMPARISON:  CT chest: 09/15/2020 FINDINGS: Mediastinal blood pool activity: SUV max 2.87 Liver activity: SUV max NA NECK: No hypermetabolic lymph nodes in the neck. Incidental CT findings: none CHEST: FDG avid cavitary lung mass within the posteromedial current right lower lobe measures 8.5 x 7.1 cm and  has an SUV max of 14.0, image 108/3. There is adjacent dense airspace consolidation within the superior segment of the right lower lobe and posterior right upper lobe also concerning for tumor. Within the anteromedial left upper lobe there is a 3.6 x 2.8 cm spiculated nodule which has an SUV max of 28, image 71/3. Additional, innumerable small pulmonary nodules are identified throughout both lungs compatible with metastatic disease. FDG avid mass within AP window of the left mediastinum measures 5.3 x 3.3 cm and has an SUV max of 28.96, image 83/3. Enlarged and FDG avid right paratracheal, subcarinal and right hilar lymph nodes are also identified. Incidental CT findings: Moderate to severe changes of bullous paraseptal and centrilobular emphysema. Aortic atherosclerosis. Coronary artery calcifications. ABDOMEN/PELVIS: No abnormal FDG uptake within the liver, pancreas, or spleen. Bilateral adrenal nodules are FDG avid and suspicious for metastasis. Left adrenal nodule measures 1.3 cm and has an SUV max of 22, image 144/3. Right adrenal nodule measures the 1.5 cm and has an SUV max of 28.65, image 144/3. No FDG avid abdominopelvic lymph nodes. Incidental CT findings: Several liver cysts are identified within the left lobe which measure up to 2.7 cm. Aortic atherosclerosis. Infrarenal abdominal aortic aneurysm measures 5.5 cm, image 182/3 SKELETON: Multifocal FDG avid bone metastases are identified. Index lesion within the body of sternum has an SUV max of 23.4. FDG avid lesion within the sacrum with mild lucent changes on corresponding CT images noted. This measures 1.9 cm and has an SUV max  of 5.19, image 215/3. FDG avid lesion within the right superior pubic rami has an SUV max of 11.99. Small lesion within the left femoral head has an SUV max of 7.87. Within the right acetabulum there is a lesion which has an SUV max of 13.46. Incidental CT findings: none IMPRESSION: 1. FDG avid cavitary mass within the  posteromedial right lung base is identified compatible with primary bronchogenic carcinoma. 2. Numerous bilateral FDG avid pulmonary nodules are identified compatible with metastatic disease. 3. FDG avid right hilar, and bilateral mediastinal nodal metastases. 4. Bilateral adrenal gland metastases 5. Multifocal FDG avid bone metastases. 6. 5.5 cm infrarenal abdominal aortic aneurysm. Recommend referral to a vascular specialist. This recommendation follows ACR consensus guidelines: White Paper of the ACR Incidental Findings Committee II on Vascular Findings. J Am Coll Radiol 2013; 10:789-794. Aortic aneurysm NOS (ICD10-I71.9). 7. Aortic Atherosclerosis (ICD10-I70.0) and Emphysema (ICD10-J43.9). Coronary artery calcifications Electronically Signed   By: Kerby Moors M.D.   On: 10/18/2020 14:13   DG Swallowing Func-Speech Pathology  Result Date: 10/09/2020 Please refer to "Notes" tab for Speech Pathology notes.   ASSESSMENT & PLAN:   [name] #70 year old male patient with a history of schizophrenia/bipolar group home resident with recent CT scan shows multifocal pneumonia; also left upper lobe mass; mediastinal adenopathy and bilateral lung nodules. JAN 2022- PET scan- FDG avid cavitary mass within the posteromedial right lung base is identified compatible with primary bronchogenic carcinoma. Numerous bilateral FDG avid pulmonary nodules are identified compatible with metastatic disease; FDG avid right hilar, and bilateral mediastinal nodal metastases;  Bilateral adrenal gland metastases;  Multifocal FDG avid bone metastases  #Psychiatric illness: Schizophrenia/bipolar disorder s/p evaluation with psychiatry-patient deemed competent.   #I had a long discussion with the patient and his caregiver regarding the ominous findings noted on the PET scan concerning for metastatic disease.  Discussed with them that he is not a good candidate for any subsequent biopsies/or chemotherapy.   Given his poor performance status/underlying schizophrenia-multiple comorbidities he would be at extremely high risk of complications from biopsy/any further therapies.  Introduced the hospice philosophy with the patient and caregiver.  Patient has been evaluated by palliative care outpatient basis.  We will reach out to Christin regarding the above recommendations.   # DISPOSITION: # Follow up as needed-Dr.B  Cc; [place], Jeniffer  All questions were answered. The patient knows to call the clinic with any problems, questions or concerns.    Cammie Sickle, MD 10/25/2020 4:36 PM

## 2020-10-25 ENCOUNTER — Telehealth: Payer: Self-pay | Admitting: Internal Medicine

## 2020-10-25 NOTE — Telephone Encounter (Signed)
On 1/19-I called patient's brother Rayburn Ma to discuss patient's plan of care.  Left a voicemail to call us back.

## 2020-10-25 NOTE — Progress Notes (Signed)
  Oncology Nurse Navigator Documentation  Navigator Location: [facility]-Med Onc (10/25/20 1100)   )Navigator Encounter Type: Follow-up Appt (10/25/20 1100)                     Patient Visit Type: MedOnc (10/25/20 1100)   Barriers/Navigation Needs: Morbidities/Frailty;Multiple Hospital Admissions;Coordination of Care (10/25/20 1100)   Interventions: Coordination of Care;Referrals (10/25/20 1100) Referrals: Palliative Care (10/25/20 1100) Coordination of Care: Hospice/Palliative Care (10/25/20 1100)        Acuity: Level 3-Moderate Needs (3-4 Barriers Identified) (10/25/20 1100)    met with patient and his caregiver, Ivin Booty, to review PET scan results and recommendations from Dr. Rogue Bussing. All questions answered during visit. Reviewed with pt and caregiver to continue follow up with home based palliative care at this time. Pt and caregiver given contact. Instructed to call with any further questions or needs. Pt's brother updated after visit. See telephone note. Nothing further needed at this time.     Time Spent with Patient: 60 (10/25/20 1100)

## 2020-10-26 ENCOUNTER — Telehealth: Payer: Self-pay | Admitting: Internal Medicine

## 2020-10-26 ENCOUNTER — Inpatient Hospital Stay: Payer: Medicare Other | Admitting: Physician Assistant

## 2020-10-26 ENCOUNTER — Telehealth: Payer: Self-pay | Admitting: *Deleted

## 2020-10-26 NOTE — Telephone Encounter (Signed)
On 1/20-I had a long discussion with patient's brother Erlene Quan regarding results of the PET scan showing metastatic disease.  Patient declining performance status/poor candidate for any biopsy or chemotherapy.  Family in agreement.  Recommend hospice when appropriate.  Discussed with palliative care Ms.Gusler.

## 2020-10-26 NOTE — Telephone Encounter (Addendum)
Patient brother Rayburn Ma called requesting that Dr B call his brother in [place] to discuss patient condition because he is considering having patient move there with him. He is not on the ROI, but this is also extenuating circumstances. His name is Joseph Holt and his number is [phone]

## 2020-10-29 ENCOUNTER — Telehealth (INDEPENDENT_AMBULATORY_CARE_PROVIDER_SITE_OTHER): Payer: Medicare Other | Admitting: Physician Assistant

## 2020-10-29 ENCOUNTER — Telehealth: Payer: Self-pay | Admitting: Internal Medicine

## 2020-10-29 DIAGNOSIS — R110 Nausea: Secondary | ICD-10-CM | POA: Diagnosis not present

## 2020-10-29 DIAGNOSIS — C7802 Secondary malignant neoplasm of left lung: Secondary | ICD-10-CM | POA: Diagnosis not present

## 2020-10-29 DIAGNOSIS — C3492 Malignant neoplasm of unspecified part of left bronchus or lung: Secondary | ICD-10-CM | POA: Diagnosis not present

## 2020-10-29 DIAGNOSIS — C7801 Secondary malignant neoplasm of right lung: Secondary | ICD-10-CM

## 2020-10-29 MED ORDER — ONDANSETRON HCL 4 MG PO TABS: 4.0000 mg | ORAL_TABLET | Freq: Three times a day (TID) | ORAL | 0 refills | Status: AC | PRN

## 2020-10-29 NOTE — Telephone Encounter (Signed)
On 1/21--had a long discussion the patient's other brother Ifeoluwa Beller regarding patient's overall poor prognosis/worsening clinical condition.  Patient's brother wants the patient to live with him in [place].  Clinically I am not sure if patient is strong enough to travel to [place].  Brother will make a trip to [place] in the next week or so.  Recommend that he reach out to palliative care for further updates on the patient.  Patient for now will follow up with [place] only as needed.

## 2020-10-29 NOTE — Progress Notes (Unsigned)
Virtual telephone visit    Virtual Visit via Telephone Note   This visit type was conducted due to national recommendations for restrictions regarding the COVID-19 Pandemic (e.g. social distancing) in an effort to limit this patient's exposure and mitigate transmission in our community. Due to his co-morbid illnesses, this patient is at least at moderate risk for complications without adequate follow up. This format is felt to be most appropriate for this patient at this time. The patient did not have access to video technology or had technical difficulties with video requiring transitioning to audio format only (telephone). Physical exam was limited to content and character of the telephone converstion.    Patient location: Group Home Provider location: [facility]  I discussed the limitations of evaluation and management by telemedicine and the availability of in person appointments. The patient expressed understanding and agreed to proceed.  Conversation was completed with Jenny Reichmann and Chrystie Nose.  Visit Date: 10/29/2020  Today's healthcare provider: Mar Daring, PA-C   No chief complaint on file.  Subjective    HPI    Joseph Holt was seen in the hospital and admitted on 09/30/20-10/02/20. He was found to have advanced metastatic lung cancer with pneumonia and possible sepsis with acute on chronic respiratory failure with hypoxia. He was treated with antibiotics, steroids, home O2. Palliative care was ordered for outpatient.   He has since seen Dr. Rogue Bussing, Oncology, and is not a good candidate for treatments due to advanced disease and case complicated by patient having schizophrenia. Palliative will be consulting patient soon and it is recommended to start Hospice care soon.   Patient Active Problem List   Diagnosis Date Noted  . Sepsis ([place]) 09/30/2020  . Pulmonary metastasis ([place]) 09/30/2020  . Acute on chronic respiratory failure with hypercapnia  ([place]) 09/30/2020  . Severe sepsis ([place]) 09/30/2020  . S/P thoracentesis   . Palliative care by specialist   . Goals of care, counseling/discussion   . [name] 09/16/2020  . Metastatic primary lung cancer, left ([place])   . Benign prostatic hyperplasia without lower urinary tract symptoms   . COPD with acute exacerbation ([place])   . Acute on chronic respiratory failure with hypoxia ([place])   . Hyperlipidemia   . Right upper lobe pneumonia 09/14/2020  . Shortness of breath 09/14/2020  . Generalized weakness 09/14/2020  . GERD (gastroesophageal reflux disease) 09/14/2020  . Mild protein-calorie malnutrition ([place]) 12/06/2018  . Mixed simple and mucopurulent chronic bronchitis ([place]) 12/06/2018  . Sleep disturbance 09/06/2018  . Bipolar 1 disorder ([place]) 04/14/2018  . Schizo affective schizophrenia ([place]) 04/14/2018  . Cognitive impairment 01/06/2018  . Tobacco use disorder 01/02/2018  . Paranoid schizophrenia ([place]) 02/21/2015  . Social maladjustment 02/21/2015   Past Medical History:  Diagnosis Date  . Bipolar 1 disorder ([place])   . BPH (benign prostatic hyperplasia)   . COPD (chronic obstructive pulmonary disease) ([place])   . GERD (gastroesophageal reflux disease)   . Lung mass   . Mild protein-calorie malnutrition ([place])   . Schizo affective schizophrenia ([place])   . Tobacco abuse       Medications: Outpatient Medications Prior to Visit  Medication Sig  . ADVAIR DISKUS 250-50 MCG/DOSE AEPB Inhale 1 puff into the lungs 2 (two) times daily.  Marland Kitchen amantadine (SYMMETREL) 100 MG capsule TAKE 1 CAPSULE BY MOUTH TWICE DAILY. **DO NOT CRUSH** (Patient taking differently: Take 100 mg by mouth 2 (two) times daily.)  . Ascorbic Acid (  VITAMIN C) 1000 MG tablet TAKE ONE TABLET BY MOUTH EVERY DAY (Patient taking differently: Take 1,000 mg by mouth daily.)  . cetirizine (ZYRTEC) 10 MG tablet TAKE (1) TABLET BY MOUTH ONCE DAILY. (Patient taking differently: Take 10 mg by mouth daily.)   . chlorproMAZINE (THORAZINE) 25 MG tablet TAKE 1 TABLET BY MOUTH 3 TIMES DAILY. (Patient taking differently: Take 25 mg by mouth 3 (three) times daily.)  . Cholecalciferol (VITAMIN D3) 1.25 MG (50000 UT) CAPS Take 50,000 Units by mouth once a week.  . divalproex (DEPAKOTE) 500 MG DR tablet TAKE 1 TABLET BY MOUTH TWICE DAILY. **DO NOT CRUSH** (Patient taking differently: Take 500 mg by mouth 2 (two) times daily.)  . feeding supplement (ENSURE ENLIVE / ENSURE PLUS) LIQD Take 237 mLs by mouth 3 (three) times daily between meals.  . Fluticasone-Umeclidin-Vilant (TRELEGY ELLIPTA) 200-62.5-25 MCG/INH AEPB Inhale 1 Dose into the lungs in the morning and at bedtime. Resume previous dose, confirmed with pharmacy  . gabapentin (NEURONTIN) 400 MG capsule TAKE 1 TABLET BY MOUTH EVERY MORNING. (Patient taking differently: Take 400 mg by mouth daily.)  . Ipratropium-Albuterol (COMBIVENT RESPIMAT) 20-100 MCG/ACT AERS respimat Inhale 1 puff into the lungs every 6 (six) hours.  Marland Kitchen LORazepam (ATIVAN) 0.5 MG tablet TAKE 1 TABLET BY MOUTH AT BEDTIME. (Patient not taking: Reported on 10/24/2020)  . meloxicam (MOBIC) 15 MG tablet Take 15 mg by mouth daily.  . mirtazapine (REMERON) 30 MG tablet TAKE 1 TABLET BY MOUTH AT BEDTIME. (Patient taking differently: Take 30 mg by mouth at bedtime.)  . Misc. Devices (PULSE OXIMETER FOR FINGER) MISC To check pulse oximeter PRN; If Oxygen is less than 90% he is to wear 2L O2 during day while awake  . NAMZARIC 28-10 MG CP24 TAKE ONE CAPSULE ONCE A DAY. * DO NOT CRUSH * (Patient taking differently: Take 1 capsule by mouth daily.)  . omeprazole (PRILOSEC) 20 MG capsule TAKE ONE CAPSULE BY MOUTH EVERY MORNING *DO NOT CRUSH* (Patient taking differently: Take 20 mg by mouth daily.)  . QUEtiapine (SEROQUEL XR) 300 MG 24 hr tablet Take 1 tablet by mouth daily.  . simvastatin (ZOCOR) 20 MG tablet TAKE 1 TABLET BY MOUTH AT BEDTIME. (Patient taking differently: Take 20 mg by mouth at bedtime.)  .  tamsulosin (FLOMAX) 0.4 MG CAPS capsule TAKE (1) CAPSULE BY MOUTH AT BEDTIME.*DO NOT CRUSH* (Patient taking differently: Take 0.4 mg by mouth at bedtime.)  . VASCEPA 1 g capsule TAKE (2) CAPSULES BY MOUTH TWICE DAILY.   No facility-administered medications prior to visit.    Review of Systems  Constitutional: Positive for activity change, appetite change, fatigue and unexpected weight change.  Respiratory: Positive for shortness of breath and wheezing.   Cardiovascular: Negative.   Gastrointestinal: Positive for nausea and vomiting.  Neurological: Positive for weakness.    Last CBC Lab Results  Component Value Date   WBC 9.0 10/01/2020   HGB 11.1 (L) 10/01/2020   HCT 36.5 (L) 10/01/2020   MCV 95.1 10/01/2020   MCH 28.9 10/01/2020   RDW 15.3 10/01/2020   PLT 296 16/60/6301   Last metabolic panel Lab Results  Component Value Date   GLUCOSE 94 10/01/2020   NA 137 10/01/2020   K 4.4 10/01/2020   CL 100 10/01/2020   CO2 28 10/01/2020   BUN 15 10/01/2020   CREATININE 0.86 10/01/2020   GFRNONAA >60 10/01/2020   GFRAA 79 01/02/2020   CALCIUM 8.9 10/01/2020   PHOS 3.5 09/15/2020   PROT 7.3  09/29/2020   ALBUMIN 2.9 (L) 09/29/2020   LABGLOB 2.5 01/02/2020   AGRATIO 1.8 01/02/2020   BILITOT 0.4 09/29/2020   ALKPHOS 72 09/29/2020   AST 25 09/29/2020   ALT 27 09/29/2020   ANIONGAP 9 10/01/2020      Objective    There were no vitals taken for this visit. BP Readings from Last 3 Encounters:  10/24/20 101/73  10/02/20 118/89  09/26/20 98/67   Wt Readings from Last 3 Encounters:  10/24/20 150 lb (68 kg)  10/02/20 159 lb 14.4 oz (72.5 kg)  09/26/20 161 lb (73 kg)        Assessment & Plan     1. Nausea Will give zofran as below for nausea. Patient was noted to have aspiration pneumonia and was unable to follow directions well during speech evaluation during hospitalization. This will most likely continue to progress and cause issues. Discussed with caregiver that  Hospice care is being recommended and that we can await Palliative evaluation on Wednesday. Will await this appt and help facilitate if needed.  - ondansetron (ZOFRAN) 4 MG tablet; Take 1 tablet (4 mg total) by mouth every 8 (eight) hours as needed.  Dispense: 20 tablet; Refill: 0  2. Malignant neoplasm metastatic to both lungs [facility]) See above medical treatment plan.  3. Metastatic primary lung cancer, left ([place]) See above medical treatment plan.   No follow-ups on file.    I discussed the assessment and treatment plan with the patient. The patient was provided an opportunity to ask questions and all were answered. The patient agreed with the plan and demonstrated an understanding of the instructions.   The patient was advised to call back or seek an in-person evaluation if the symptoms worsen or if the condition fails to improve as anticipated.  I provided 17 minutes of non-face-to-face time during this encounter.  Reynolds Bowl, PA-C, have reviewed all documentation for this visit. The documentation on 10/30/20 for the exam, diagnosis, procedures, and orders are all accurate and complete.  Rubye Beach [facility] [phone] (phone) [phone] (fax)  [place]

## 2020-10-29 NOTE — Telephone Encounter (Signed)
Unable to discuss care with Jodelle Gross if he is not on the [facility]

## 2020-10-29 NOTE — Telephone Encounter (Signed)
Spoke to [employer]. Marland Kitchen GB

## 2020-10-30 ENCOUNTER — Encounter: Payer: Self-pay | Admitting: Physician Assistant

## 2020-10-30 ENCOUNTER — Telehealth: Payer: Self-pay | Admitting: Nurse Practitioner

## 2020-10-30 ENCOUNTER — Telehealth: Payer: Self-pay | Admitting: Physician Assistant

## 2020-10-30 DIAGNOSIS — T17908A Unspecified foreign body in respiratory tract, part unspecified causing other injury, initial encounter: Secondary | ICD-10-CM | POA: Insufficient documentation

## 2020-10-30 NOTE — Telephone Encounter (Signed)
Palliative care is seeing him tomorrow and going to be discussing hospice care for him

## 2020-10-30 NOTE — Telephone Encounter (Signed)
Forms completed and faxed last night

## 2020-10-30 NOTE — Telephone Encounter (Signed)
Joseph Holt with [facility] saw the pt yesterday.   Pt was put on O2 about a month ago. His resting O2 drops into the 80's now.  Ardine Eng feels the pt is declining, not moving too much.  She does say pt vomited 2 times yesterday.  Pt is incontinent, (which he wasn't before) not getting out of the bed much. Pt is not eating and not taking his medication all the time. Joseph Holt called EMS, but cancelled it because she did get him up, did some breathing exercises, and got his oxygen level up. Joseph Holt doesn't feel the group home is equipped to take care of someone this sick .  Not any fault of their own, this is an independent living group home.   cb  [phone]

## 2020-10-30 NOTE — Telephone Encounter (Signed)
Forms completed and faxed 10/29/2020. TNP

## 2020-10-30 NOTE — Telephone Encounter (Signed)
I called Joseph Holt brother, Joseph Holt. Clinical update discussed. We talked about Oncology and visit with Dr Rogue Bussing. We talked about past medical history including mental health from which he was diagnosed with schizophrenia around age 70. We talked about Joseph Holt life where he had five brothers three living. One lives in [place], the current brother lives in [place]. We talked about Joseph Holt mom passing away under Hospice Services when he was living in her home. We talked about Joseph Holt being familiar with Hospice Services though with cognitive impairment questionable what he remembers. We talked about medical goals of care including aggressive vs conservative versus comfort care. We talked about code status and choice Joseph Holt made to be a DNR and discussions surrounding that visit. Joseph Holt was in agreement. We talked about health care power of attorney. We talked about Joseph Holt the owner of the care home. We talked about scheduling Zoom meeting with Joseph Holt, his brothers and Joseph. Darnell Holt to further discuss medical goals of care including option of Hospice Services which he will likely be eligible for. In addition Joseph Holt mentioned funeral arrangements for which he wants to further discuss with his brother Joseph Holt. Zoom meeting schedule with palliative care visit tomorrow. I called Joseph Holt and updated, and agreement and sure that she will be able to participate in the meeting as well.  Total time spent 30 minutes  Documentation 5 minutes  Phone discussion 25 minutes

## 2020-10-31 ENCOUNTER — Other Ambulatory Visit: Payer: Medicare Other | Admitting: Nurse Practitioner

## 2020-10-31 ENCOUNTER — Telehealth: Payer: Self-pay | Admitting: Hospice and Palliative Medicine

## 2020-10-31 ENCOUNTER — Other Ambulatory Visit: Payer: Self-pay

## 2020-10-31 ENCOUNTER — Encounter: Payer: Self-pay | Admitting: Nurse Practitioner

## 2020-10-31 DIAGNOSIS — C3492 Malignant neoplasm of unspecified part of left bronchus or lung: Secondary | ICD-10-CM

## 2020-10-31 DIAGNOSIS — Z515 Encounter for palliative care: Secondary | ICD-10-CM

## 2020-10-31 NOTE — Progress Notes (Signed)
[facility] Consult Note Telephone: [phone]  Fax: [phone]  PATIENT NAME: Joseph Holt. DOB: 1951-07-09 MRN: [id number]  PRIMARY CARE PROVIDER:   Rubye Beach  REFERRING PROVIDER:  Mar Daring, PA-C [place],  [place] [id number] RESPONSIBLE PARTY:   Self; Manager of Group Home:  Chrystie Nose  group home: [phone]  cell:  [phone]; Arnaldo Natal and Jodelle Gross  1.Advance Care Planning;DNR transition to Hospice services with medical goals to focus on comfort  2. Goals of Care: Goals include to maximize quality of life and symptom management. Our advance care planning conversation included a discussion about:   The value and importance of advance care planning  Exploration of personal, cultural or spiritual beliefs that might influence medical decisions  Exploration of goals of care in the event of a sudden injury or illness  Identification and preparation of a healthcare agent  Review and updating or creation of anadvance directive document.  3.Palliative care encounter; Palliative care encounter; Palliative medicine team will continue to support patient, patient's family, and medical team. Visit consisted of counseling and education dealing with the complex and emotionally intense issues of symptom management and palliative care in the setting of serious and potentially life-threatening illness  I spent 90 minutes providing this consultation,  from 9:45am to 11L15am. More than 50% of the time in this consultation was spent coordinating communication.   HISTORY OF PRESENT ILLNESS:  Joseph Holt. is a 70 y.o. year old male with multiple medical problems including Pulmonary metastases primary left lung cancer, COPD, BPH, gerd, tobacco abuse, mild protein calorie malnutrition, bipolar, hernia repair, appendectomy. Hospitalize 12 / 07/2020 to 12 / 15 /  2021 / large left upper lobe mass with multiple nodules suspected cancer work up to be done outpatient multifocal pneumonia community-acquired, acute hypoxic respiratory failure. COPD exacerbation. Bipolar and schizophrenic disorder Psychiatry did evaluate. Hospitalized 09/30/2020 to 12 / 28 / 2021 for acute on chronic hypoxic respiratory failure, sepsis right upper lobe pneumonia secondary to aspiration pneumonia. Mr. Shill continues to reside in a care home. Today's Palliative care visit in person to further discuss medical goals of Care. At present Mr. Imbert is lying in bed. Mr. Bona appears to billeted, pale, chronically ill, on oxygen. Mrs. Werling was at the facility. Mr. Moates and I talked about recent Oncology visit with Dr Burlene Arnt. We talked about cancer. We talked about poor prognosis with options for focusing on comfort, opt for not to pursue aggressive treatment options. We talked about option of Hospice Services. We talked about Medicare benefit and what services Hospice would provide. Mr. Capell endorses that would be fine. We zoomed with his brother Clair Gulling and Rayburn Ma. We talked about lung cancer at length. We talked about medical goals of care for comfort. We talked about option of Hospice Services. Family and Mr. Saber all in agreement to proceed with Hospice. We talked about what services Hospice would provide. We talked about frequent and zoom meetings with family as his brothers live in [place] and [place]. We talked about realistic expectations with progression of cancer. We talked about getting a hospital bed which would be more comfortable for Mr. Pischke. Clair Gulling and Rayburn Ma talked to Mr. Manas about what he wishes to happen at the time of passing. Jim recommended cremation and to have his ashes transported to [place] to be placed with his brother Legrand Como who has also passed on. Mr.  Carbine in agreement. Ms Darnell Level did join the meeting. We talked about overall support. Discuss will contact Dr  Rogue Bussing, [place] and proceed to proceed with Hospice order. Hospice Physicians theme to Mr. Steinke eligible at this time. We talked about role Palliative care and plan of care. Therapeutic listening, support provided. Contact information. Questions answered is satisfaction.  Palliative Care was asked to help to continue to address goals of care.   CODE STATUS: DNR  PPS: 30% HOSPICE ELIGIBILITY/DIAGNOSIS: TBD  PAST MEDICAL HISTORY:  Past Medical History:  Diagnosis Date  . Bipolar 1 disorder ([place])   . BPH (benign prostatic hyperplasia)   . COPD (chronic obstructive pulmonary disease) ([place])   . GERD (gastroesophageal reflux disease)   . Lung mass   . Mild protein-calorie malnutrition ([place])   . Schizo affective schizophrenia ([place])   . Tobacco abuse     SOCIAL HX:  Social History   Tobacco Use  . Smoking status: Current Every Day Smoker    Packs/day: 0.20    Types: Cigarettes  . Smokeless tobacco: Never Used  . Tobacco comment: pt smokes 1 cigarette a day  Substance Use Topics  . Alcohol use: No    ALLERGIES: No Known Allergies   PERTINENT MEDICATIONS:  Outpatient Encounter Medications as of 10/31/2020  Medication Sig  . ADVAIR DISKUS 250-50 MCG/DOSE AEPB Inhale 1 puff into the lungs 2 (two) times daily.  Marland Kitchen amantadine (SYMMETREL) 100 MG capsule TAKE 1 CAPSULE BY MOUTH TWICE DAILY. **DO NOT CRUSH** (Patient taking differently: Take 100 mg by mouth 2 (two) times daily.)  . Ascorbic Acid (VITAMIN C) 1000 MG tablet TAKE ONE TABLET BY MOUTH EVERY DAY (Patient taking differently: Take 1,000 mg by mouth daily.)  . cetirizine (ZYRTEC) 10 MG tablet TAKE (1) TABLET BY MOUTH ONCE DAILY. (Patient taking differently: Take 10 mg by mouth daily.)  . chlorproMAZINE (THORAZINE) 25 MG tablet TAKE 1 TABLET BY MOUTH 3 TIMES DAILY. (Patient taking differently: Take 25 mg by mouth 3 (three) times daily.)  . Cholecalciferol (VITAMIN D3) 1.25 MG (50000 UT) CAPS Take 50,000 Units by mouth  once a week.  . divalproex (DEPAKOTE) 500 MG DR tablet TAKE 1 TABLET BY MOUTH TWICE DAILY. **DO NOT CRUSH** (Patient taking differently: Take 500 mg by mouth 2 (two) times daily.)  . feeding supplement (ENSURE ENLIVE / ENSURE PLUS) LIQD Take 237 mLs by mouth 3 (three) times daily between meals.  . Fluticasone-Umeclidin-Vilant (TRELEGY ELLIPTA) 200-62.5-25 MCG/INH AEPB Inhale 1 Dose into the lungs in the morning and at bedtime. Resume previous dose, confirmed with pharmacy  . gabapentin (NEURONTIN) 400 MG capsule TAKE 1 TABLET BY MOUTH EVERY MORNING. (Patient taking differently: Take 400 mg by mouth daily.)  . Ipratropium-Albuterol (COMBIVENT RESPIMAT) 20-100 MCG/ACT AERS respimat Inhale 1 puff into the lungs every 6 (six) hours.  Marland Kitchen LORazepam (ATIVAN) 0.5 MG tablet TAKE 1 TABLET BY MOUTH AT BEDTIME. (Patient not taking: Reported on 10/24/2020)  . meloxicam (MOBIC) 15 MG tablet Take 15 mg by mouth daily.  . mirtazapine (REMERON) 30 MG tablet TAKE 1 TABLET BY MOUTH AT BEDTIME. (Patient taking differently: Take 30 mg by mouth at bedtime.)  . Misc. Devices (PULSE OXIMETER FOR FINGER) MISC To check pulse oximeter PRN; If Oxygen is less than 90% he is to wear 2L O2 during day while awake  . NAMZARIC 28-10 MG CP24 TAKE ONE CAPSULE ONCE A DAY. * DO NOT CRUSH * (Patient taking differently: Take 1 capsule by mouth daily.)  .  omeprazole (PRILOSEC) 20 MG capsule TAKE ONE CAPSULE BY MOUTH EVERY MORNING *DO NOT CRUSH* (Patient taking differently: Take 20 mg by mouth daily.)  . ondansetron (ZOFRAN) 4 MG tablet Take 1 tablet (4 mg total) by mouth every 8 (eight) hours as needed.  Marland Kitchen QUEtiapine (SEROQUEL XR) 300 MG 24 hr tablet Take 1 tablet by mouth daily.  . simvastatin (ZOCOR) 20 MG tablet TAKE 1 TABLET BY MOUTH AT BEDTIME. (Patient taking differently: Take 20 mg by mouth at bedtime.)  . tamsulosin (FLOMAX) 0.4 MG CAPS capsule TAKE (1) CAPSULE BY MOUTH AT BEDTIME.*DO NOT CRUSH* (Patient taking differently: Take 0.4 mg  by mouth at bedtime.)  . VASCEPA 1 g capsule TAKE (2) CAPSULES BY MOUTH TWICE DAILY.   No facility-administered encounter medications on file as of 10/31/2020.    PHYSICAL EXAM:   General: chronically ill, O2 dependent, pale, frail appearing, thin with temporal wasting male Cardiovascular: regular rate and rhythm Pulmonary: clear ant fields Extremities: no edema, no joint deformities; muscle wasting Neurological: functional quadriplegic; generalized weakness  Aimar [place] Ihor Gully, NP

## 2020-10-31 NOTE — Telephone Encounter (Signed)
I called in an order for hospice to [facility].

## 2020-12-04 DEATH — deceased

## 2022-02-17 IMAGING — DX DG CHEST 1V PORT
1 series · 1 of 1 positions shown · non-contrast
Comparison: 09/14/2020.

CLINICAL DATA: Status post right thoracentesis.

EXAM:
PORTABLE CHEST 1 VIEW

[chest ap]
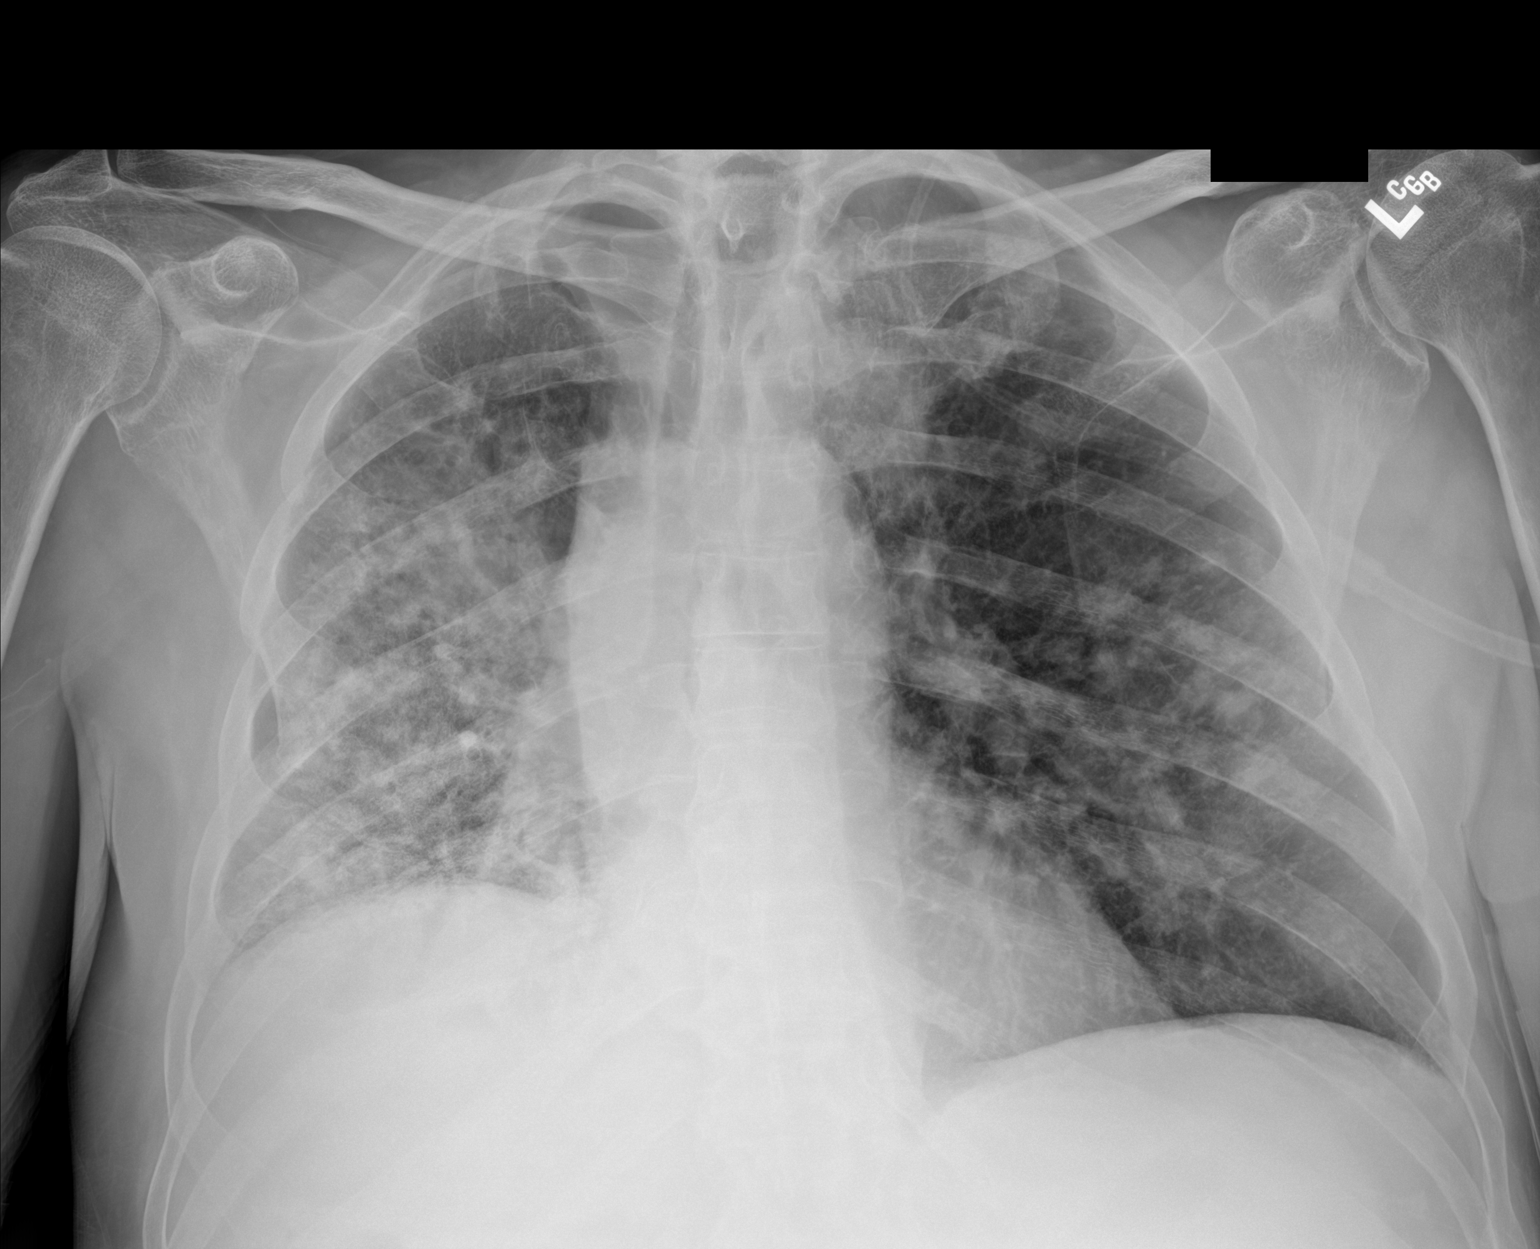

[1 of 1 positions shown; findings below may reference images not displayed]

FINDINGS: 0871 hours. Stable asymmetric elevation right hemidiaphragm. Bullous
change noted right lung apex. Relatively diffuse patchy airspace
disease again noted right lung with nodular opacities in the left
lung better characterized on recent chest CT. Medial/suprahilar left
upper lobe pulmonary mass lesion evident. No substantial left
pleural effusion. Cardiopericardial silhouette is at upper limits of
normal for size. The visualized bony structures of the thorax show
no acute abnormality.
IMPRESSION: 1. No pneumothorax status post right thoracentesis.
2. Persistent patchy airspace disease right lung with left lung
nodules better characterized on recent chest CT. The right pulmonary
nodule seen on previous chest CT are largely obscured by the
airspace disease on today's x-ray.

## 2022-02-17 IMAGING — US US THORACENTESIS ASP PLEURAL SPACE W/IMG GUIDE
1 series · 3 of 3 positions shown · non-contrast
Comparison: none

INDICATION: Patient with history of smoking with worsening shortness of breath
and cough found to have a right sided pleural effusion. Request is
for diagnostic right sided thoracentesis.

[Series 1: us thoracentesis asp pleural s · 3 of 3 slices shown]
[im 1/3]
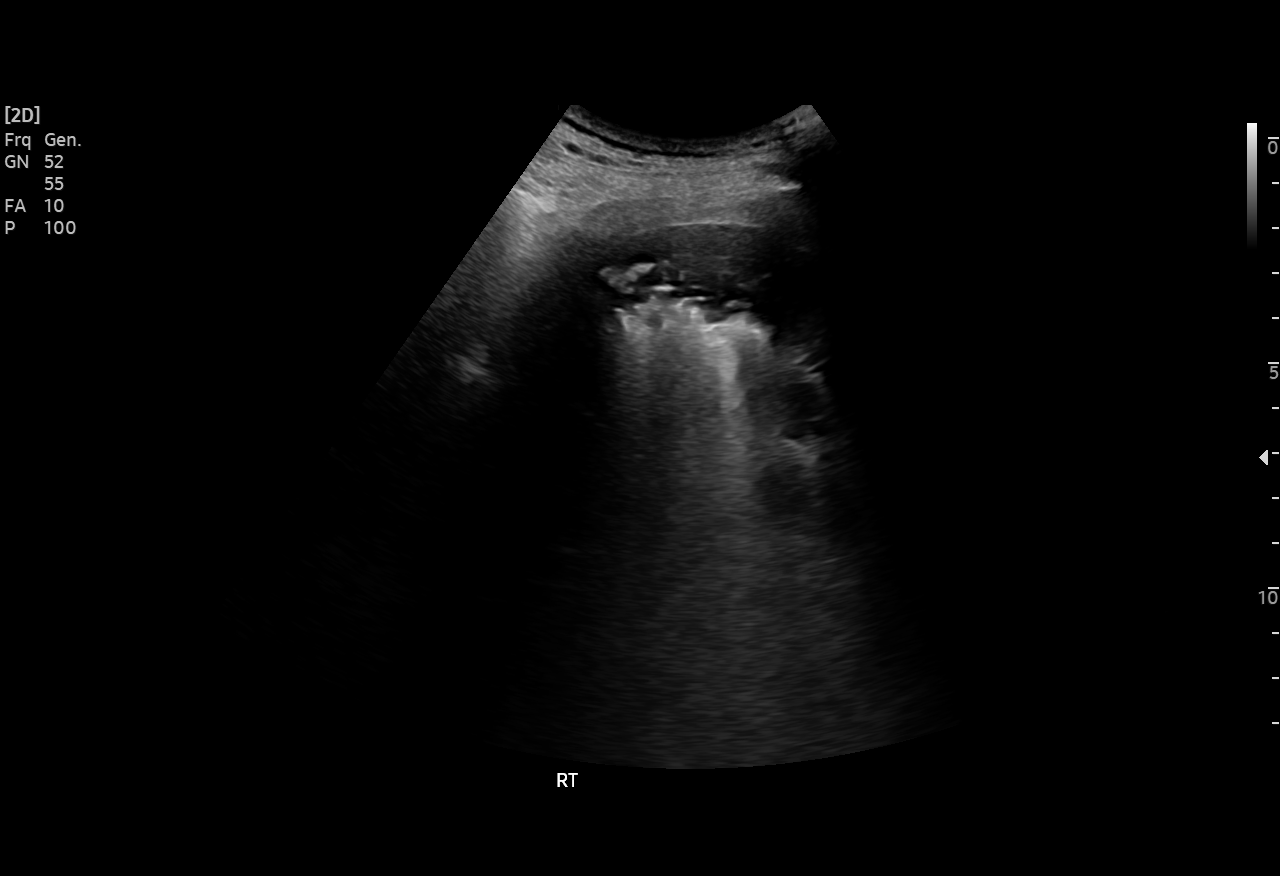
[im 2/3]
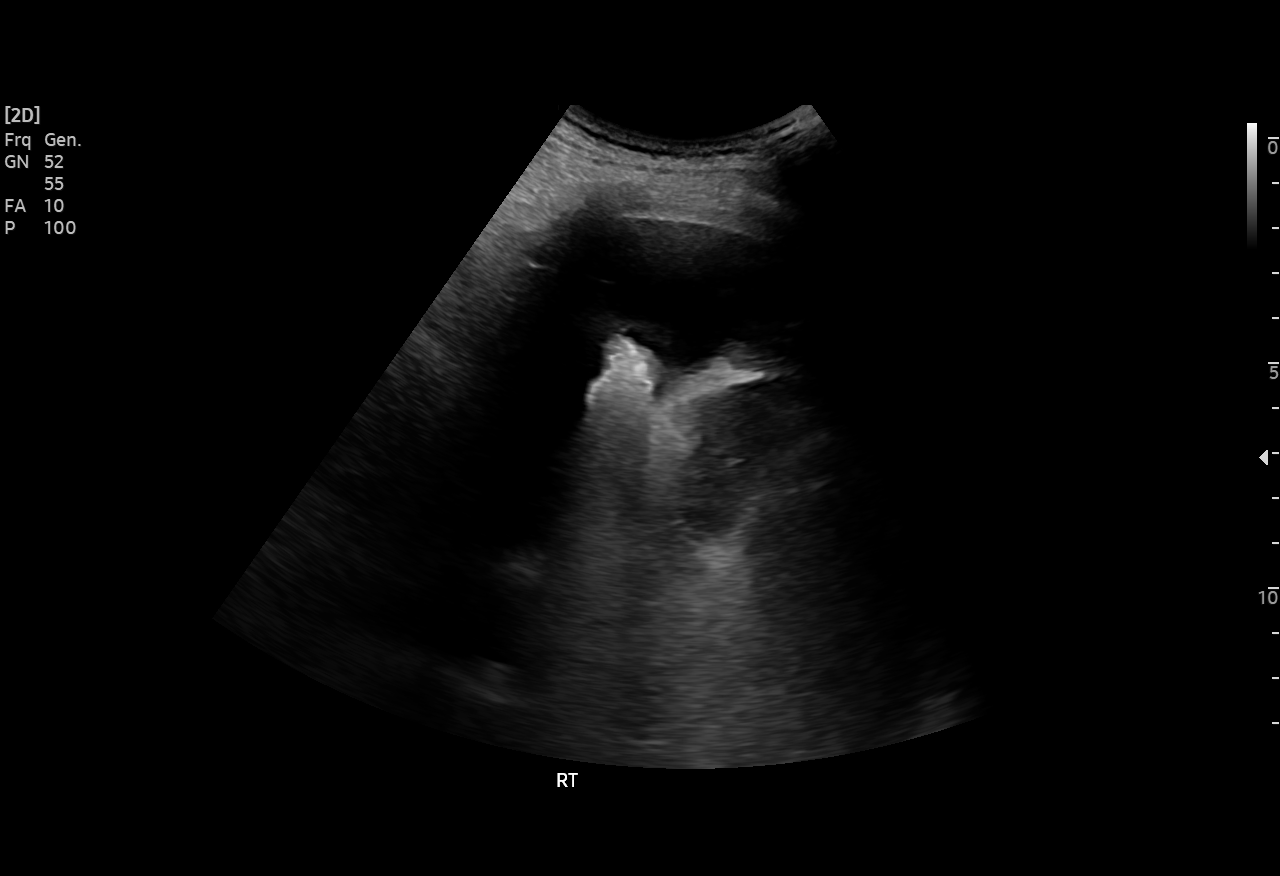
[im 3/3]
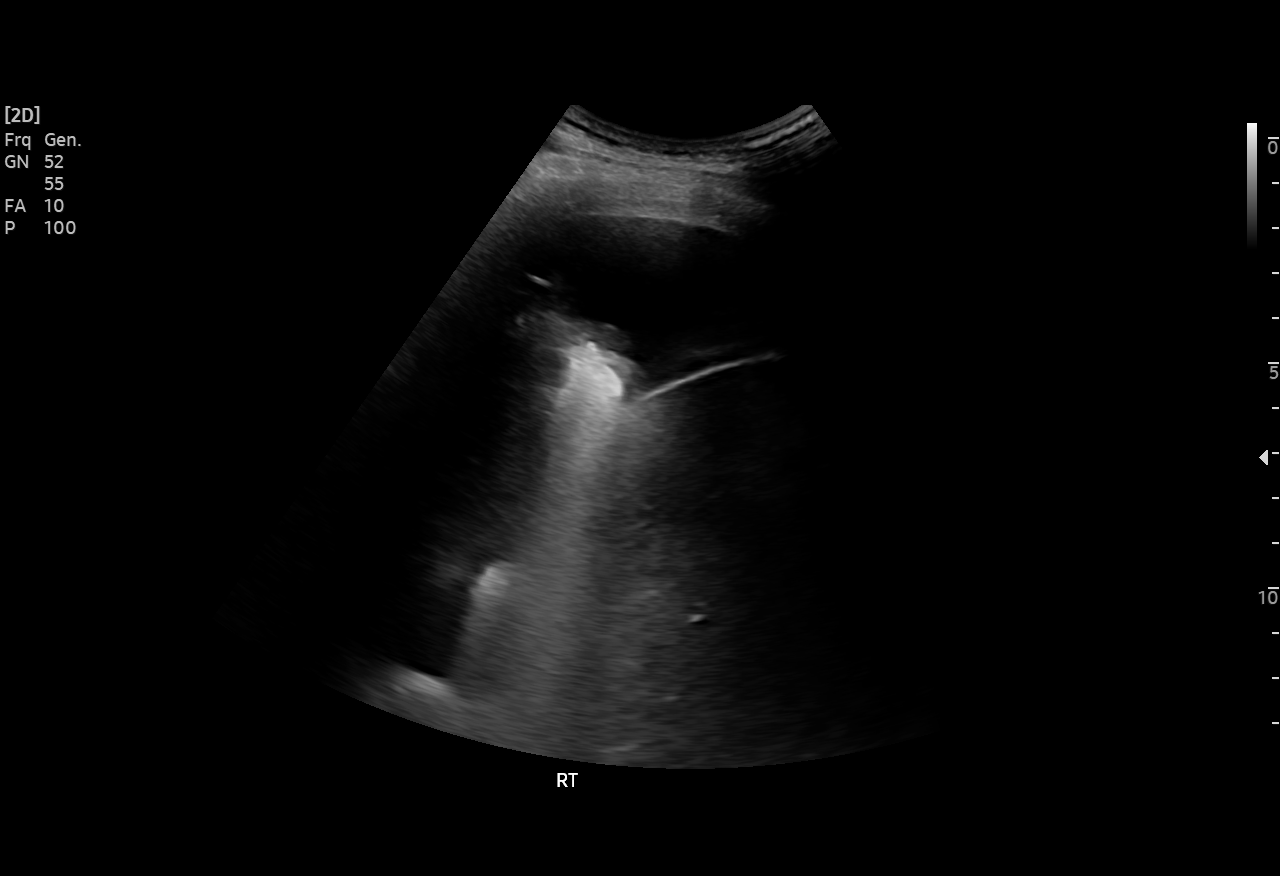

[3 of 3 positions shown; findings below may reference images not displayed]

EXAM:
ULTRASOUND GUIDED RIGHT SIDED DIAGNOSTIC THORACENTESIS

MEDICATIONS:
Lidocaine 1% 10 mL

COMPLICATIONS:
None immediate.

PROCEDURE:
An ultrasound guided thoracentesis was thoroughly discussed with the
patient and questions answered. The benefits, risks, alternatives
and complications were also discussed. The patient understands and
wishes to proceed with the procedure. Written consent was obtained.

Ultrasound was performed to localize and mark an adequate pocket of
fluid in the right chest. The area was then prepped and draped in
the normal sterile fashion. 1% Lidocaine was used for local
anesthesia. Under ultrasound guidance a 6 Fr Safe-T-Centesis
catheter was introduced. Thoracentesis was performed. The catheter
was removed and a dressing applied.
FINDINGS: A total of approximately 250 mL of straw-colored fluid was removed.
Samples were sent to the laboratory as requested by the clinical
team.
IMPRESSION: Successful ultrasound guided diagnostic right-sided thoracentesis
yielding 250 mL of pleural fluid.

Read by: Flor Eliana Geminis, NP

## 2022-02-18 IMAGING — CT CT ABD-PELV W/ CM
2 of 5 series · 16 of 46 positions shown, 18 images · IV contrast (APPLIED)
Comparison: Recent chest CT on 09/15/2020

CLINICAL DATA: Newly diagnosed lung carcinoma.  Staging.

EXAM:
CT ABDOMEN AND PELVIS WITH CONTRAST
TECHNIQUE: Multidetector CT imaging of the abdomen and pelvis was performed
using the standard protocol following bolus administration of
intravenous contrast.
CONTRAST:  100mL OMNIPAQUE IOHEXOL 300 MG/ML  SOLN

[Series 2: axial st · axial · 0.78mm/px · z∈[-1045,-590]mm · 13 of 103 slices shown, 15 images]
[im 6/103  soft-tissue]
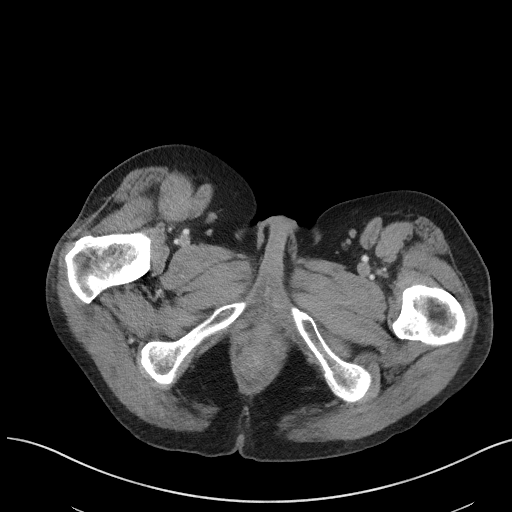
[im 6/103  bone]
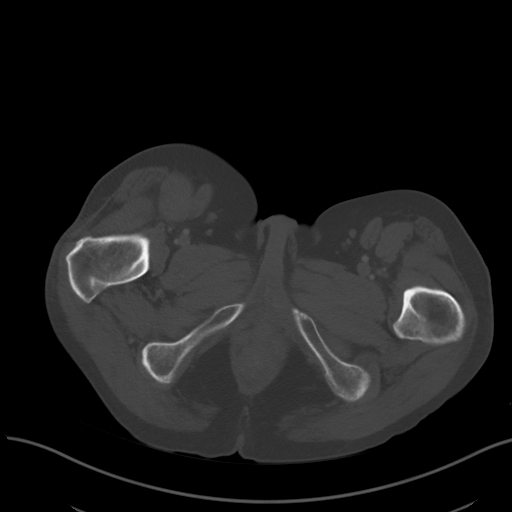
[im 17/103  soft-tissue]
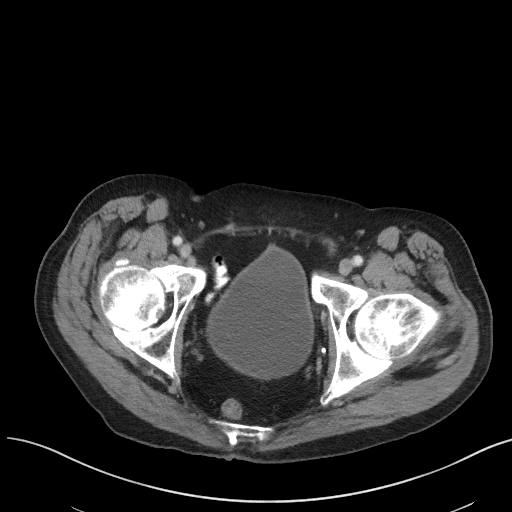
[im 22/103  soft-tissue]
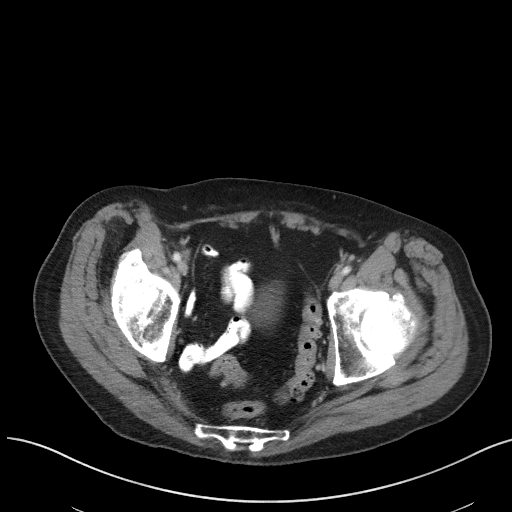
[im 27/103  soft-tissue]
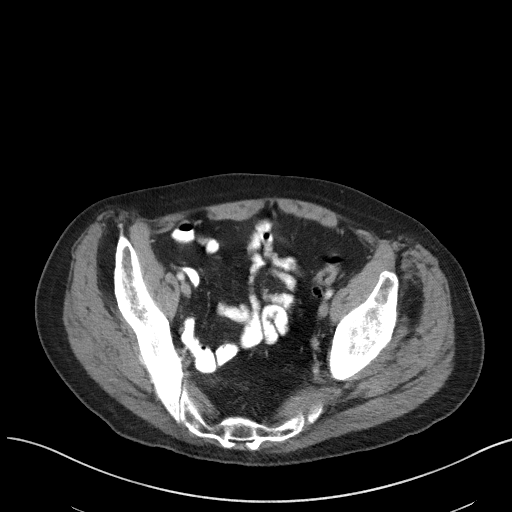
[im 38/103  soft-tissue]
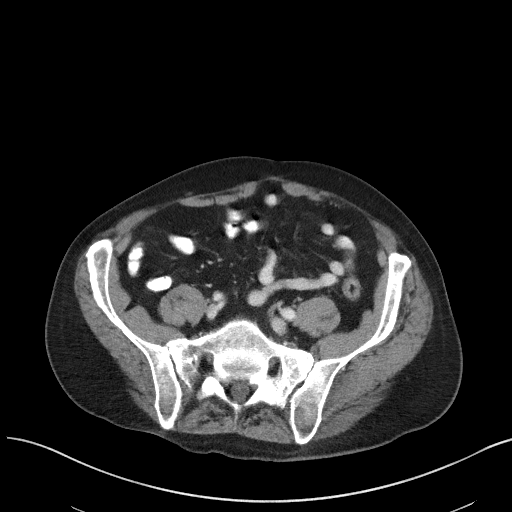
[im 43/103  soft-tissue]
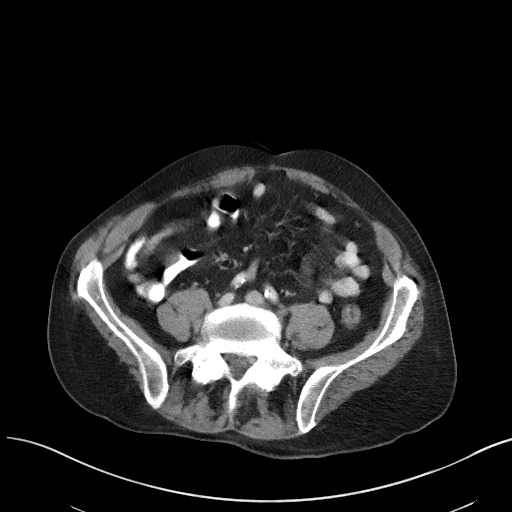
[im 54/103  soft-tissue]
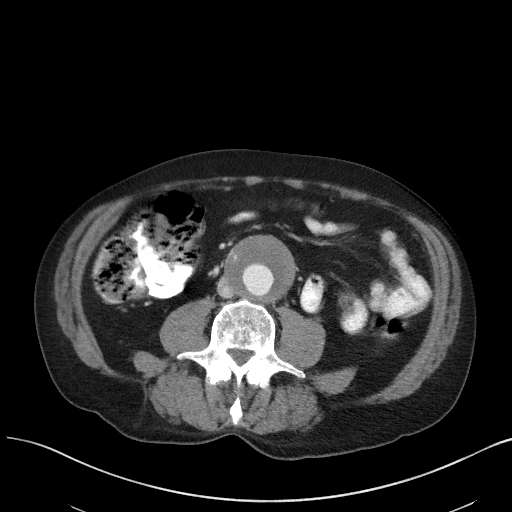
[im 60/103  soft-tissue]
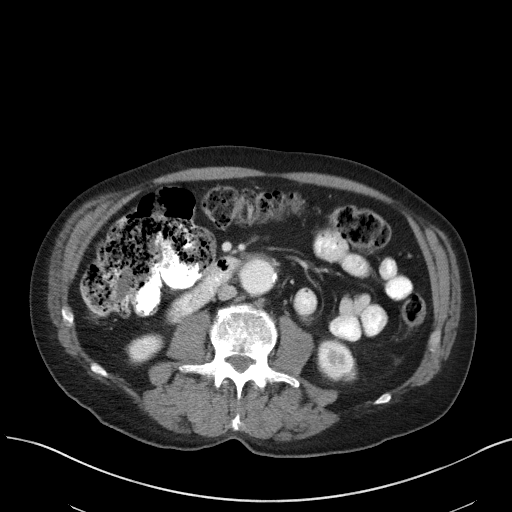
[im 65/103  soft-tissue]
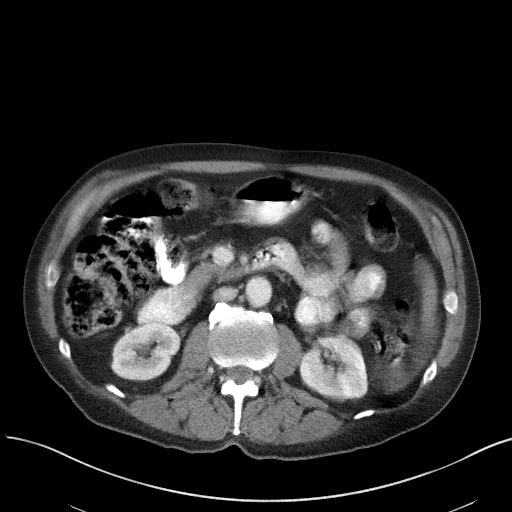
[im 65/103  bone]
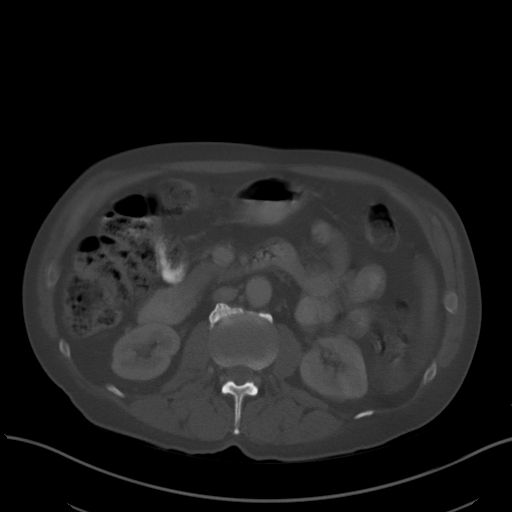
[im 76/103  soft-tissue]
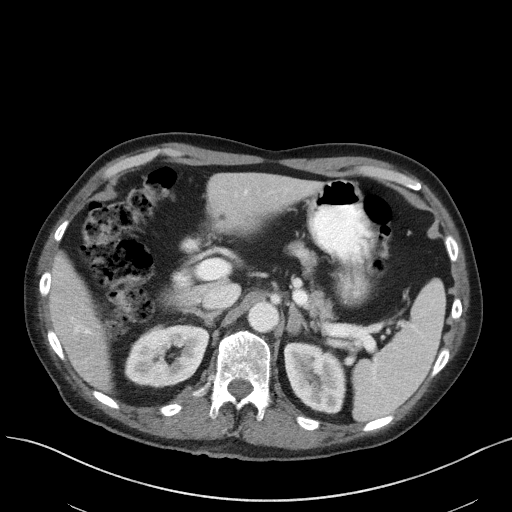
[im 81/103  soft-tissue]
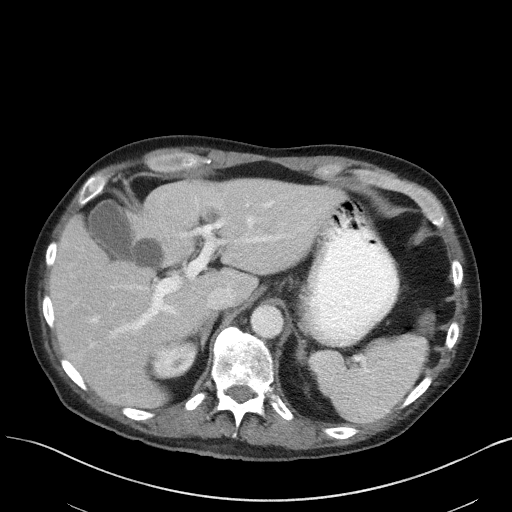
[im 86/103  soft-tissue]
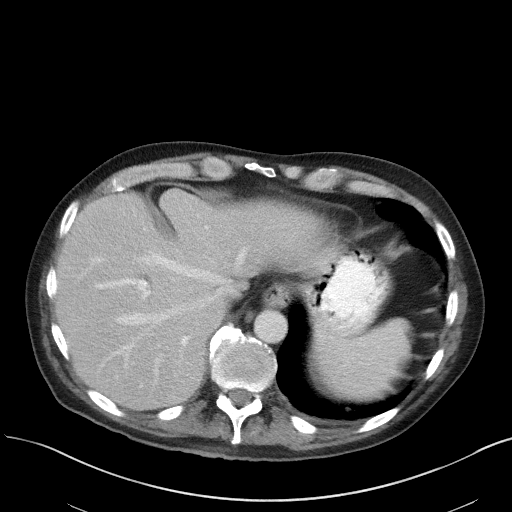
[im 97/103  soft-tissue]
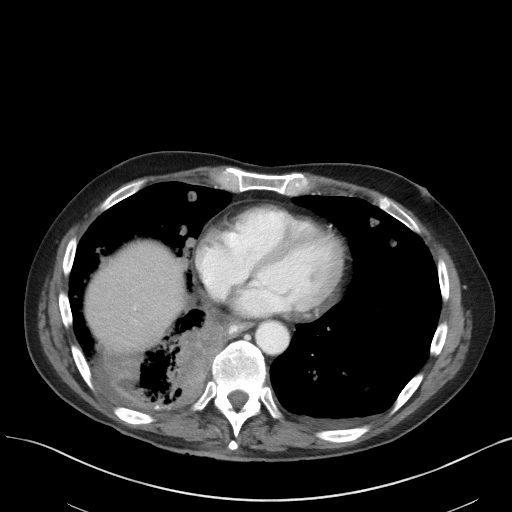

[Series 5: coronal st · coronal · 0.75mm/px · 3 of 87 slices shown]
[im 29/87  soft-tissue]
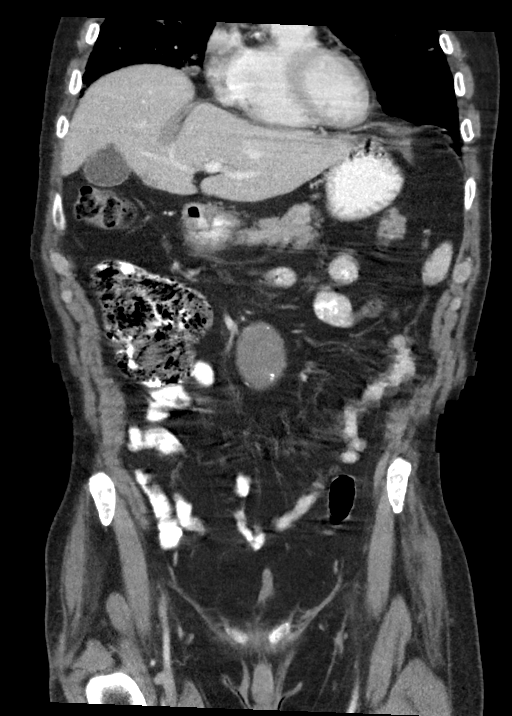
[im 39/87  soft-tissue]
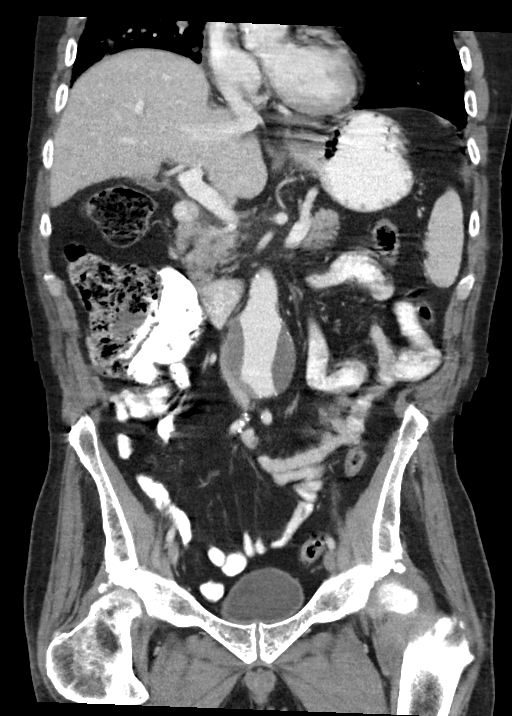
[im 48/87  soft-tissue]
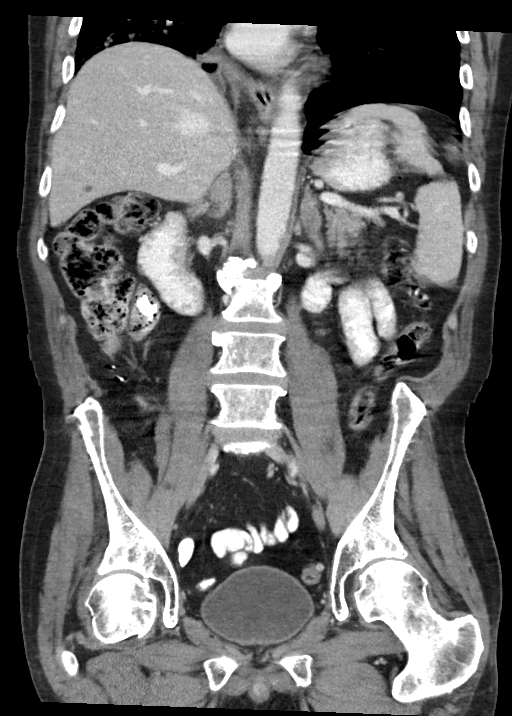

[16 of 46 positions shown; findings below may reference images not displayed]

FINDINGS: Lower Chest: Numerous pulmonary nodules are again seen in the
visualized portions of the lung bases as well as probable
postobstructive atelectasis or pneumonitis in the medial right lower
lobe, without significant change compared to recent chest CT.

Hepatobiliary: No hepatic masses identified. A few small cysts are
noted in right and left lobes. Gallbladder is unremarkable. No
evidence of biliary ductal dilatation.

Pancreas:  No mass or inflammatory changes.

Spleen: Within normal limits in size and appearance.

Adrenals/Urinary Tract: No adrenal or renal masses identified. No
evidence of ureteral calculi or hydronephrosis.

Stomach/Bowel: No evidence of obstruction, inflammatory process or
abnormal fluid collections.

Vascular/Lymphatic: No pathologically enlarged lymph nodes.
Infrarenal abdominal aortic aneurysm is seen measuring 5.6 cm in
maximum diameter. No evidence of aneurysm leak or rupture.

Reproductive:  No mass or other significant abnormality.

Other:  None.

Musculoskeletal:  No suspicious bone lesions identified.
IMPRESSION: Pulmonary metastases in visualized portions of both lung bases.

No evidence of abdominal or pelvic metastatic disease.

5.6 cm infrarenal abdominal aortic aneurysm. Recommend referral to a
vascular specialist. This recommendation follows ACR consensus
guidelines: White Paper of the ACR Incidental Findings Committee II
